# Patient Record
Sex: Female | Born: 1955 | Race: White | Hispanic: No | Marital: Married | Smoking: Former smoker
Health system: Southern US, Community
[De-identification: ages and names within clinical notes are randomized; demographics above are authoritative.]

## PROBLEM LIST (undated history)

## (undated) DIAGNOSIS — F329 Major depressive disorder, single episode, unspecified: Secondary | ICD-10-CM

## (undated) DIAGNOSIS — J449 Chronic obstructive pulmonary disease, unspecified: Secondary | ICD-10-CM

## (undated) DIAGNOSIS — K3184 Gastroparesis: Secondary | ICD-10-CM

## (undated) DIAGNOSIS — Z780 Asymptomatic menopausal state: Secondary | ICD-10-CM

## (undated) DIAGNOSIS — A4902 Methicillin resistant Staphylococcus aureus infection, unspecified site: Secondary | ICD-10-CM

## (undated) DIAGNOSIS — F32A Depression, unspecified: Secondary | ICD-10-CM

## (undated) DIAGNOSIS — M797 Fibromyalgia: Secondary | ICD-10-CM

## (undated) DIAGNOSIS — G629 Polyneuropathy, unspecified: Secondary | ICD-10-CM

## (undated) DIAGNOSIS — A0472 Enterocolitis due to Clostridium difficile, not specified as recurrent: Secondary | ICD-10-CM

## (undated) DIAGNOSIS — E669 Obesity, unspecified: Secondary | ICD-10-CM

## (undated) DIAGNOSIS — K219 Gastro-esophageal reflux disease without esophagitis: Secondary | ICD-10-CM

## (undated) DIAGNOSIS — K589 Irritable bowel syndrome without diarrhea: Secondary | ICD-10-CM

## (undated) DIAGNOSIS — Z8719 Personal history of other diseases of the digestive system: Secondary | ICD-10-CM

## (undated) DIAGNOSIS — E119 Type 2 diabetes mellitus without complications: Secondary | ICD-10-CM

## (undated) DIAGNOSIS — K2270 Barrett's esophagus without dysplasia: Secondary | ICD-10-CM

## (undated) DIAGNOSIS — G4733 Obstructive sleep apnea (adult) (pediatric): Secondary | ICD-10-CM

## (undated) DIAGNOSIS — G43909 Migraine, unspecified, not intractable, without status migrainosus: Secondary | ICD-10-CM

## (undated) HISTORY — DX: Irritable bowel syndrome, unspecified: K58.9

## (undated) HISTORY — DX: Obstructive sleep apnea (adult) (pediatric): G47.33

## (undated) HISTORY — DX: Depression, unspecified: F32.A

## (undated) HISTORY — DX: Methicillin resistant Staphylococcus aureus infection, unspecified site: A49.02

## (undated) HISTORY — DX: Enterocolitis due to Clostridium difficile, not specified as recurrent: A04.72

## (undated) HISTORY — DX: Gastroparesis: K31.84

## (undated) HISTORY — DX: Chronic obstructive pulmonary disease, unspecified: J44.9

## (undated) HISTORY — DX: Obesity, unspecified: E66.9

## (undated) HISTORY — DX: Polyneuropathy, unspecified: G62.9

## (undated) HISTORY — DX: Fibromyalgia: M79.7

## (undated) HISTORY — PX: OTHER SURGICAL HISTORY: SHX169

## (undated) HISTORY — DX: Personal history of other diseases of the digestive system: Z87.19

## (undated) HISTORY — PX: APPENDECTOMY: SHX54

## (undated) HISTORY — PX: SINUS SURGERY WITH INSTATRAK: SHX5215

## (undated) HISTORY — PX: VENTRAL HERNIA REPAIR: SHX424

## (undated) HISTORY — DX: Type 2 diabetes mellitus without complications: E11.9

## (undated) HISTORY — DX: Asymptomatic menopausal state: Z78.0

## (undated) HISTORY — DX: Migraine, unspecified, not intractable, without status migrainosus: G43.909

## (undated) HISTORY — DX: Major depressive disorder, single episode, unspecified: F32.9

## (undated) HISTORY — PX: CHOLECYSTECTOMY: SHX55

## (undated) HISTORY — PX: MOUTH SURGERY: SHX715

## (undated) HISTORY — PX: ABDOMINAL HYSTERECTOMY: SHX81

## (undated) HISTORY — DX: Gastro-esophageal reflux disease without esophagitis: K21.9

---

## 1997-09-25 HISTORY — PX: NISSEN FUNDOPLICATION: SHX2091

## 1998-03-12 ENCOUNTER — Ambulatory Visit (HOSPITAL_COMMUNITY): Admission: RE | Admit: 1998-03-12 | Discharge: 1998-03-12 | Payer: Self-pay | Admitting: Internal Medicine

## 1998-08-09 ENCOUNTER — Ambulatory Visit (HOSPITAL_COMMUNITY): Admission: RE | Admit: 1998-08-09 | Discharge: 1998-08-09 | Payer: Self-pay | Admitting: Gastroenterology

## 1998-09-03 ENCOUNTER — Encounter: Payer: Self-pay | Admitting: Surgery

## 1998-09-07 ENCOUNTER — Inpatient Hospital Stay (HOSPITAL_COMMUNITY): Admission: EM | Admit: 1998-09-07 | Discharge: 1998-09-09 | Payer: Self-pay | Admitting: Surgery

## 1998-09-09 ENCOUNTER — Encounter: Payer: Self-pay | Admitting: Surgery

## 1998-12-29 ENCOUNTER — Ambulatory Visit (HOSPITAL_COMMUNITY): Admission: RE | Admit: 1998-12-29 | Discharge: 1998-12-29 | Payer: Self-pay | Admitting: Surgery

## 1998-12-29 ENCOUNTER — Encounter: Payer: Self-pay | Admitting: Surgery

## 1999-07-17 ENCOUNTER — Emergency Department (HOSPITAL_COMMUNITY): Admission: EM | Admit: 1999-07-17 | Discharge: 1999-07-17 | Payer: Self-pay | Admitting: Emergency Medicine

## 1999-07-17 ENCOUNTER — Encounter: Payer: Self-pay | Admitting: Emergency Medicine

## 1999-09-08 ENCOUNTER — Other Ambulatory Visit: Admission: RE | Admit: 1999-09-08 | Discharge: 1999-09-08 | Payer: Self-pay | Admitting: Obstetrics and Gynecology

## 1999-11-08 ENCOUNTER — Encounter: Payer: Self-pay | Admitting: Emergency Medicine

## 1999-11-08 ENCOUNTER — Emergency Department (HOSPITAL_COMMUNITY): Admission: EM | Admit: 1999-11-08 | Discharge: 1999-11-08 | Payer: Self-pay | Admitting: Emergency Medicine

## 1999-11-11 ENCOUNTER — Ambulatory Visit (HOSPITAL_COMMUNITY): Admission: RE | Admit: 1999-11-11 | Discharge: 1999-11-11 | Payer: Self-pay | Admitting: Internal Medicine

## 1999-11-11 ENCOUNTER — Encounter: Payer: Self-pay | Admitting: Internal Medicine

## 2000-02-07 ENCOUNTER — Encounter: Payer: Self-pay | Admitting: Obstetrics & Gynecology

## 2000-02-07 ENCOUNTER — Ambulatory Visit (HOSPITAL_COMMUNITY): Admission: RE | Admit: 2000-02-07 | Discharge: 2000-02-07 | Payer: Self-pay | Admitting: Obstetrics & Gynecology

## 2000-07-24 ENCOUNTER — Ambulatory Visit (HOSPITAL_COMMUNITY): Admission: RE | Admit: 2000-07-24 | Discharge: 2000-07-24 | Payer: Self-pay | Admitting: Gastroenterology

## 2000-07-24 ENCOUNTER — Encounter: Payer: Self-pay | Admitting: Gastroenterology

## 2000-10-22 ENCOUNTER — Other Ambulatory Visit: Admission: RE | Admit: 2000-10-22 | Discharge: 2000-10-22 | Payer: Self-pay | Admitting: Internal Medicine

## 2000-12-26 ENCOUNTER — Ambulatory Visit (HOSPITAL_COMMUNITY): Admission: RE | Admit: 2000-12-26 | Discharge: 2000-12-26 | Payer: Self-pay | Admitting: Internal Medicine

## 2000-12-26 ENCOUNTER — Encounter: Payer: Self-pay | Admitting: Internal Medicine

## 2001-01-09 ENCOUNTER — Encounter: Payer: Self-pay | Admitting: Pulmonary Disease

## 2001-01-09 ENCOUNTER — Ambulatory Visit: Admission: RE | Admit: 2001-01-09 | Discharge: 2001-01-09 | Payer: Self-pay | Admitting: Pulmonary Disease

## 2001-01-28 ENCOUNTER — Encounter: Payer: Self-pay | Admitting: Internal Medicine

## 2001-01-28 ENCOUNTER — Ambulatory Visit (HOSPITAL_BASED_OUTPATIENT_CLINIC_OR_DEPARTMENT_OTHER): Admission: RE | Admit: 2001-01-28 | Discharge: 2001-01-28 | Payer: Self-pay | Admitting: Pulmonary Disease

## 2001-03-13 ENCOUNTER — Encounter: Payer: Self-pay | Admitting: Internal Medicine

## 2001-03-13 ENCOUNTER — Ambulatory Visit (HOSPITAL_COMMUNITY): Admission: RE | Admit: 2001-03-13 | Discharge: 2001-03-13 | Payer: Self-pay | Admitting: Internal Medicine

## 2001-06-19 ENCOUNTER — Ambulatory Visit (HOSPITAL_COMMUNITY): Admission: RE | Admit: 2001-06-19 | Discharge: 2001-06-19 | Payer: Self-pay | Admitting: Internal Medicine

## 2001-06-19 ENCOUNTER — Encounter: Payer: Self-pay | Admitting: Internal Medicine

## 2001-06-19 ENCOUNTER — Encounter (INDEPENDENT_AMBULATORY_CARE_PROVIDER_SITE_OTHER): Payer: Self-pay | Admitting: Specialist

## 2002-04-08 ENCOUNTER — Encounter: Payer: Self-pay | Admitting: Internal Medicine

## 2002-04-08 ENCOUNTER — Ambulatory Visit (HOSPITAL_COMMUNITY): Admission: RE | Admit: 2002-04-08 | Discharge: 2002-04-08 | Payer: Self-pay | Admitting: Internal Medicine

## 2002-05-06 ENCOUNTER — Encounter: Payer: Self-pay | Admitting: Internal Medicine

## 2002-05-06 LAB — HM COLONOSCOPY

## 2003-01-23 ENCOUNTER — Encounter: Payer: Self-pay | Admitting: Emergency Medicine

## 2003-01-23 ENCOUNTER — Inpatient Hospital Stay (HOSPITAL_COMMUNITY): Admission: EM | Admit: 2003-01-23 | Discharge: 2003-01-25 | Payer: Self-pay | Admitting: Emergency Medicine

## 2003-01-23 ENCOUNTER — Encounter: Payer: Self-pay | Admitting: Internal Medicine

## 2003-01-23 ENCOUNTER — Encounter (INDEPENDENT_AMBULATORY_CARE_PROVIDER_SITE_OTHER): Payer: Self-pay | Admitting: *Deleted

## 2003-01-24 ENCOUNTER — Encounter (INDEPENDENT_AMBULATORY_CARE_PROVIDER_SITE_OTHER): Payer: Self-pay | Admitting: *Deleted

## 2003-01-24 ENCOUNTER — Encounter: Payer: Self-pay | Admitting: General Surgery

## 2003-03-16 ENCOUNTER — Ambulatory Visit (HOSPITAL_COMMUNITY): Admission: RE | Admit: 2003-03-16 | Discharge: 2003-03-16 | Payer: Self-pay | Admitting: General Surgery

## 2003-03-16 ENCOUNTER — Encounter (INDEPENDENT_AMBULATORY_CARE_PROVIDER_SITE_OTHER): Payer: Self-pay | Admitting: *Deleted

## 2003-03-16 ENCOUNTER — Encounter: Payer: Self-pay | Admitting: General Surgery

## 2003-05-05 ENCOUNTER — Ambulatory Visit (HOSPITAL_COMMUNITY): Admission: RE | Admit: 2003-05-05 | Discharge: 2003-05-05 | Payer: Self-pay | Admitting: Internal Medicine

## 2003-05-05 ENCOUNTER — Encounter: Payer: Self-pay | Admitting: Internal Medicine

## 2003-09-01 ENCOUNTER — Emergency Department (HOSPITAL_COMMUNITY): Admission: EM | Admit: 2003-09-01 | Discharge: 2003-09-01 | Payer: Self-pay | Admitting: Emergency Medicine

## 2003-10-30 ENCOUNTER — Emergency Department (HOSPITAL_COMMUNITY): Admission: EM | Admit: 2003-10-30 | Discharge: 2003-10-31 | Payer: Self-pay | Admitting: Emergency Medicine

## 2003-11-24 ENCOUNTER — Inpatient Hospital Stay (HOSPITAL_COMMUNITY): Admission: EM | Admit: 2003-11-24 | Discharge: 2003-11-28 | Payer: Self-pay | Admitting: Internal Medicine

## 2004-01-23 ENCOUNTER — Encounter: Payer: Self-pay | Admitting: Internal Medicine

## 2004-04-19 ENCOUNTER — Encounter: Admission: RE | Admit: 2004-04-19 | Discharge: 2004-07-18 | Payer: Self-pay | Admitting: Internal Medicine

## 2004-06-08 ENCOUNTER — Ambulatory Visit (HOSPITAL_COMMUNITY): Admission: RE | Admit: 2004-06-08 | Discharge: 2004-06-08 | Payer: Self-pay | Admitting: Internal Medicine

## 2004-08-29 ENCOUNTER — Ambulatory Visit: Payer: Self-pay | Admitting: Internal Medicine

## 2004-09-28 ENCOUNTER — Ambulatory Visit: Payer: Self-pay | Admitting: Internal Medicine

## 2004-10-31 ENCOUNTER — Encounter: Admission: RE | Admit: 2004-10-31 | Discharge: 2004-10-31 | Payer: Self-pay | Admitting: Surgery

## 2004-10-31 ENCOUNTER — Encounter (INDEPENDENT_AMBULATORY_CARE_PROVIDER_SITE_OTHER): Payer: Self-pay | Admitting: *Deleted

## 2004-11-15 ENCOUNTER — Observation Stay (HOSPITAL_COMMUNITY): Admission: RE | Admit: 2004-11-15 | Discharge: 2004-11-16 | Payer: Self-pay | Admitting: Surgery

## 2005-03-02 ENCOUNTER — Ambulatory Visit: Payer: Self-pay | Admitting: Internal Medicine

## 2005-04-10 ENCOUNTER — Ambulatory Visit: Payer: Self-pay | Admitting: Internal Medicine

## 2005-04-27 ENCOUNTER — Ambulatory Visit: Payer: Self-pay | Admitting: Internal Medicine

## 2005-08-07 ENCOUNTER — Ambulatory Visit: Payer: Self-pay | Admitting: Internal Medicine

## 2005-08-23 ENCOUNTER — Other Ambulatory Visit: Admission: RE | Admit: 2005-08-23 | Discharge: 2005-08-23 | Payer: Self-pay | Admitting: Obstetrics and Gynecology

## 2005-09-06 ENCOUNTER — Ambulatory Visit (HOSPITAL_COMMUNITY): Admission: RE | Admit: 2005-09-06 | Discharge: 2005-09-06 | Payer: Self-pay | Admitting: Obstetrics and Gynecology

## 2005-09-28 ENCOUNTER — Encounter (INDEPENDENT_AMBULATORY_CARE_PROVIDER_SITE_OTHER): Payer: Self-pay | Admitting: *Deleted

## 2005-09-28 ENCOUNTER — Encounter: Admission: RE | Admit: 2005-09-28 | Discharge: 2005-09-28 | Payer: Self-pay | Admitting: Obstetrics and Gynecology

## 2005-11-02 ENCOUNTER — Ambulatory Visit: Payer: Self-pay | Admitting: Internal Medicine

## 2005-12-07 ENCOUNTER — Ambulatory Visit: Payer: Self-pay | Admitting: Internal Medicine

## 2005-12-25 ENCOUNTER — Ambulatory Visit: Payer: Self-pay | Admitting: Internal Medicine

## 2005-12-27 ENCOUNTER — Ambulatory Visit: Payer: Self-pay | Admitting: Internal Medicine

## 2006-01-24 ENCOUNTER — Ambulatory Visit: Payer: Self-pay | Admitting: Internal Medicine

## 2006-04-26 ENCOUNTER — Encounter: Admission: RE | Admit: 2006-04-26 | Discharge: 2006-04-26 | Payer: Self-pay | Admitting: Internal Medicine

## 2006-05-16 ENCOUNTER — Ambulatory Visit: Payer: Self-pay | Admitting: Internal Medicine

## 2006-07-14 ENCOUNTER — Ambulatory Visit: Payer: Self-pay | Admitting: Family Medicine

## 2006-07-20 ENCOUNTER — Ambulatory Visit: Payer: Self-pay | Admitting: Internal Medicine

## 2006-07-25 ENCOUNTER — Ambulatory Visit: Payer: Self-pay | Admitting: Internal Medicine

## 2006-07-25 LAB — CONVERTED CEMR LAB
ALT: 25 units/L (ref 0–40)
AST: 20 units/L (ref 0–37)
Albumin: 3.8 g/dL (ref 3.5–5.2)
Alkaline Phosphatase: 87 units/L (ref 39–117)
BUN: 11 mg/dL (ref 6–23)
GFR calc non Af Amer: 71 mL/min
Glomerular Filtration Rate, Af Am: 86 mL/min/{1.73_m2}
HDL: 42.1 mg/dL (ref >39.0)
Potassium: 4.1 meq/L (ref 3.5–5.1)
Total Bilirubin: 0.7 mg/dL (ref 0.3–1.2)
Total Protein: 7 g/dL (ref 6.0–8.3)

## 2006-12-11 ENCOUNTER — Ambulatory Visit: Payer: Self-pay | Admitting: Internal Medicine

## 2006-12-26 ENCOUNTER — Ambulatory Visit: Payer: Self-pay | Admitting: Internal Medicine

## 2006-12-26 LAB — CONVERTED CEMR LAB
ALT: 23 units/L (ref 0–40)
Basophils Relative: 0.3 % (ref 0.0–1.0)
Bilirubin, Direct: 0.1 mg/dL (ref 0.0–0.3)
CO2: 29 meq/L (ref 19–32)
Calcium: 9.1 mg/dL (ref 8.4–10.5)
Creatinine, Ser: 0.8 mg/dL (ref 0.4–1.2)
Eosinophils Relative: 2.2 % (ref 0.0–5.0)
GFR calc Af Amer: 98 mL/min
Glucose, Bld: 141 mg/dL — ABNORMAL HIGH (ref 70–99)
Hemoglobin: 15.6 g/dL — ABNORMAL HIGH (ref 12.0–15.0)
Lymphocytes Relative: 34.5 % (ref 12.0–46.0)
Monocytes Absolute: 0.3 10*3/uL (ref 0.2–0.7)
Neutro Abs: 5.7 10*3/uL (ref 1.4–7.7)
Potassium: 4.2 meq/L (ref 3.5–5.1)
RDW: 12 % (ref 11.5–14.6)
TSH: 2.91 microintl units/mL (ref 0.35–5.50)
Total Bilirubin: 0.5 mg/dL (ref 0.3–1.2)
Total Protein: 6.5 g/dL (ref 6.0–8.3)
VLDL: 64 mg/dL — ABNORMAL HIGH (ref 0–40)
WBC: 9.5 10*3/uL (ref 4.5–10.5)

## 2007-01-04 ENCOUNTER — Ambulatory Visit: Payer: Self-pay | Admitting: Internal Medicine

## 2007-01-18 ENCOUNTER — Ambulatory Visit: Payer: Self-pay | Admitting: Internal Medicine

## 2007-01-24 ENCOUNTER — Ambulatory Visit (HOSPITAL_COMMUNITY): Admission: RE | Admit: 2007-01-24 | Discharge: 2007-01-24 | Payer: Self-pay | Admitting: Internal Medicine

## 2007-02-05 ENCOUNTER — Ambulatory Visit: Payer: Self-pay | Admitting: Internal Medicine

## 2007-03-08 ENCOUNTER — Ambulatory Visit: Payer: Self-pay | Admitting: Internal Medicine

## 2007-03-26 ENCOUNTER — Ambulatory Visit: Payer: Self-pay | Admitting: Internal Medicine

## 2007-03-26 LAB — CONVERTED CEMR LAB
Hemoglobin, Urine: NEGATIVE
Leukocytes, UA: NEGATIVE
Urobilinogen, UA: 0.2 (ref 0.0–1.0)

## 2007-03-27 ENCOUNTER — Ambulatory Visit: Payer: Self-pay | Admitting: Internal Medicine

## 2007-05-09 ENCOUNTER — Ambulatory Visit: Payer: Self-pay | Admitting: Internal Medicine

## 2007-05-09 LAB — CONVERTED CEMR LAB
AST: 22 units/L (ref 0–37)
BUN: 7 mg/dL (ref 6–23)
Cholesterol: 199 mg/dL (ref 0–200)
Direct LDL: 103.9 mg/dL
HDL: 37.9 mg/dL — ABNORMAL LOW (ref >39.0)
Sed Rate: 10 mm/hr (ref 0–25)
Sodium: 138 meq/L (ref 135–145)
TSH: 1.71 microintl units/mL (ref 0.35–5.50)
Total CHOL/HDL Ratio: 5.3
Triglycerides: 411 mg/dL (ref 0–149)
VLDL: 82 mg/dL — ABNORMAL HIGH (ref 0–40)
Vitamin B-12: 374 pg/mL (ref 211–911)

## 2007-05-13 ENCOUNTER — Ambulatory Visit: Payer: Self-pay | Admitting: Internal Medicine

## 2007-05-22 ENCOUNTER — Ambulatory Visit: Payer: Self-pay

## 2007-05-29 ENCOUNTER — Ambulatory Visit: Payer: Self-pay

## 2007-06-14 ENCOUNTER — Ambulatory Visit: Payer: Self-pay | Admitting: Psychology

## 2007-06-19 ENCOUNTER — Ambulatory Visit: Payer: Self-pay | Admitting: Psychology

## 2007-06-26 ENCOUNTER — Ambulatory Visit: Payer: Self-pay | Admitting: Internal Medicine

## 2007-07-09 ENCOUNTER — Encounter: Payer: Self-pay | Admitting: Internal Medicine

## 2007-07-09 DIAGNOSIS — F329 Major depressive disorder, single episode, unspecified: Secondary | ICD-10-CM

## 2007-07-09 DIAGNOSIS — K219 Gastro-esophageal reflux disease without esophagitis: Secondary | ICD-10-CM

## 2007-07-09 DIAGNOSIS — J45909 Unspecified asthma, uncomplicated: Secondary | ICD-10-CM

## 2007-07-09 DIAGNOSIS — J449 Chronic obstructive pulmonary disease, unspecified: Secondary | ICD-10-CM

## 2007-07-09 DIAGNOSIS — J4489 Other specified chronic obstructive pulmonary disease: Secondary | ICD-10-CM | POA: Insufficient documentation

## 2007-07-11 ENCOUNTER — Ambulatory Visit: Payer: Self-pay | Admitting: Psychology

## 2007-07-20 ENCOUNTER — Ambulatory Visit: Payer: Self-pay | Admitting: Internal Medicine

## 2007-07-22 ENCOUNTER — Ambulatory Visit: Payer: Self-pay | Admitting: Psychology

## 2007-07-25 ENCOUNTER — Telehealth: Payer: Self-pay | Admitting: Internal Medicine

## 2007-08-19 ENCOUNTER — Ambulatory Visit: Payer: Self-pay | Admitting: Psychology

## 2007-08-27 ENCOUNTER — Ambulatory Visit: Payer: Self-pay | Admitting: Internal Medicine

## 2007-08-27 DIAGNOSIS — R5381 Other malaise: Secondary | ICD-10-CM

## 2007-08-27 DIAGNOSIS — E7800 Pure hypercholesterolemia, unspecified: Secondary | ICD-10-CM

## 2007-08-27 DIAGNOSIS — R5383 Other fatigue: Secondary | ICD-10-CM | POA: Insufficient documentation

## 2007-08-27 DIAGNOSIS — IMO0001 Reserved for inherently not codable concepts without codable children: Secondary | ICD-10-CM | POA: Insufficient documentation

## 2007-08-27 LAB — CONVERTED CEMR LAB: Blood Glucose, Fingerstick: 90

## 2007-08-29 LAB — CONVERTED CEMR LAB
ALT: 18 units/L (ref 0–35)
AST: 22 units/L (ref 0–37)
Basophils Relative: 0.7 % (ref 0.0–1.0)
Bilirubin, Direct: 0.1 mg/dL (ref 0.0–0.3)
CO2: 27 meq/L (ref 19–32)
Calcium: 9.6 mg/dL (ref 8.4–10.5)
Chloride: 101 meq/L (ref 96–112)
Creatinine, Ser: 0.8 mg/dL (ref 0.4–1.2)
Eosinophils Relative: 1.8 % (ref 0.0–5.0)
Glucose, Bld: 139 mg/dL — ABNORMAL HIGH (ref 70–99)
Leukocytes, UA: NEGATIVE
Lymphocytes Relative: 32 % (ref 12.0–46.0)
Neutro Abs: 6.6 10*3/uL (ref 1.4–7.7)
Nitrite: NEGATIVE
Platelets: 308 10*3/uL (ref 150–400)
Specific Gravity, Urine: >=1.03 (ref 1.000–1.03)
Total Bilirubin: 0.4 mg/dL (ref 0.3–1.2)
Total Protein, Urine: NEGATIVE mg/dL
Total Protein: 6.6 g/dL (ref 6.0–8.3)
Triglycerides: 337 mg/dL (ref 0–149)
Urine Glucose: NEGATIVE mg/dL
WBC: 10.6 10*3/uL — ABNORMAL HIGH (ref 4.5–10.5)

## 2007-09-02 ENCOUNTER — Ambulatory Visit: Payer: Self-pay | Admitting: Psychology

## 2007-09-16 ENCOUNTER — Ambulatory Visit: Payer: Self-pay | Admitting: Psychology

## 2007-09-30 ENCOUNTER — Ambulatory Visit: Payer: Self-pay | Admitting: Psychology

## 2007-10-14 ENCOUNTER — Ambulatory Visit: Payer: Self-pay | Admitting: Psychology

## 2007-11-11 ENCOUNTER — Telehealth: Payer: Self-pay | Admitting: Internal Medicine

## 2007-11-27 ENCOUNTER — Ambulatory Visit: Payer: Self-pay | Admitting: Internal Medicine

## 2007-11-27 DIAGNOSIS — G609 Hereditary and idiopathic neuropathy, unspecified: Secondary | ICD-10-CM | POA: Insufficient documentation

## 2007-11-27 DIAGNOSIS — R209 Unspecified disturbances of skin sensation: Secondary | ICD-10-CM

## 2007-11-27 DIAGNOSIS — E1165 Type 2 diabetes mellitus with hyperglycemia: Secondary | ICD-10-CM

## 2007-12-09 ENCOUNTER — Ambulatory Visit: Payer: Self-pay | Admitting: Psychology

## 2007-12-31 ENCOUNTER — Ambulatory Visit: Payer: Self-pay | Admitting: Psychology

## 2008-01-02 ENCOUNTER — Ambulatory Visit: Payer: Self-pay | Admitting: Internal Medicine

## 2008-01-12 ENCOUNTER — Emergency Department (HOSPITAL_COMMUNITY): Admission: EM | Admit: 2008-01-12 | Discharge: 2008-01-12 | Payer: Self-pay | Admitting: Emergency Medicine

## 2008-01-15 ENCOUNTER — Ambulatory Visit: Payer: Self-pay | Admitting: Internal Medicine

## 2008-01-15 ENCOUNTER — Ambulatory Visit: Payer: Self-pay | Admitting: Psychology

## 2008-01-15 DIAGNOSIS — R079 Chest pain, unspecified: Secondary | ICD-10-CM

## 2008-01-15 DIAGNOSIS — F4321 Adjustment disorder with depressed mood: Secondary | ICD-10-CM | POA: Insufficient documentation

## 2008-01-15 DIAGNOSIS — F17200 Nicotine dependence, unspecified, uncomplicated: Secondary | ICD-10-CM

## 2008-01-23 ENCOUNTER — Telehealth: Payer: Self-pay | Admitting: Internal Medicine

## 2008-02-11 ENCOUNTER — Ambulatory Visit (HOSPITAL_COMMUNITY): Admission: RE | Admit: 2008-02-11 | Discharge: 2008-02-11 | Payer: Self-pay | Admitting: Internal Medicine

## 2008-02-18 ENCOUNTER — Telehealth: Payer: Self-pay | Admitting: Internal Medicine

## 2008-02-20 ENCOUNTER — Ambulatory Visit: Payer: Self-pay | Admitting: Internal Medicine

## 2008-02-21 ENCOUNTER — Encounter: Payer: Self-pay | Admitting: Internal Medicine

## 2008-02-23 LAB — CONVERTED CEMR LAB
Albumin: 3.6 g/dL (ref 3.5–5.2)
Alkaline Phosphatase: 58 units/L (ref 39–117)
BUN: 12 mg/dL (ref 6–23)
Bilirubin, Direct: 0.1 mg/dL (ref 0.0–0.3)
Calcium: 9.1 mg/dL (ref 8.4–10.5)
Eosinophils Absolute: 0.3 10*3/uL (ref 0.0–0.7)
GFR calc Af Amer: 97 mL/min
GFR calc non Af Amer: 80 mL/min
HCT: 44.5 % (ref 36.0–46.0)
MCV: 95.9 fL (ref 78.0–100.0)
Monocytes Absolute: 0.5 10*3/uL (ref 0.1–1.0)
Neutro Abs: 4.6 10*3/uL (ref 1.4–7.7)
Platelets: 266 10*3/uL (ref 150–400)
Potassium: 4.3 meq/L (ref 3.5–5.1)
RDW: 12.2 % (ref 11.5–14.6)
Sodium: 140 meq/L (ref 135–145)
TSH: 2.17 microintl units/mL (ref 0.35–5.50)
Vitamin B-12: 372 pg/mL (ref 211–911)

## 2008-02-26 ENCOUNTER — Ambulatory Visit: Payer: Self-pay | Admitting: Internal Medicine

## 2008-02-26 ENCOUNTER — Encounter (INDEPENDENT_AMBULATORY_CARE_PROVIDER_SITE_OTHER): Payer: Self-pay | Admitting: *Deleted

## 2008-02-26 DIAGNOSIS — R1310 Dysphagia, unspecified: Secondary | ICD-10-CM | POA: Insufficient documentation

## 2008-03-11 ENCOUNTER — Ambulatory Visit: Payer: Self-pay | Admitting: Internal Medicine

## 2008-03-11 DIAGNOSIS — J40 Bronchitis, not specified as acute or chronic: Secondary | ICD-10-CM | POA: Insufficient documentation

## 2008-03-11 DIAGNOSIS — R05 Cough: Secondary | ICD-10-CM

## 2008-03-11 DIAGNOSIS — R059 Cough, unspecified: Secondary | ICD-10-CM | POA: Insufficient documentation

## 2008-03-13 ENCOUNTER — Telehealth: Payer: Self-pay | Admitting: Internal Medicine

## 2008-03-18 ENCOUNTER — Encounter: Payer: Self-pay | Admitting: Internal Medicine

## 2008-03-20 ENCOUNTER — Emergency Department (HOSPITAL_COMMUNITY): Admission: EM | Admit: 2008-03-20 | Discharge: 2008-03-20 | Payer: Self-pay | Admitting: Emergency Medicine

## 2008-04-06 ENCOUNTER — Encounter: Payer: Self-pay | Admitting: Internal Medicine

## 2008-04-13 DIAGNOSIS — K589 Irritable bowel syndrome without diarrhea: Secondary | ICD-10-CM

## 2008-04-13 DIAGNOSIS — Z8719 Personal history of other diseases of the digestive system: Secondary | ICD-10-CM

## 2008-04-16 ENCOUNTER — Ambulatory Visit: Payer: Self-pay | Admitting: Internal Medicine

## 2008-04-22 ENCOUNTER — Encounter: Payer: Self-pay | Admitting: Internal Medicine

## 2008-04-22 ENCOUNTER — Ambulatory Visit (HOSPITAL_COMMUNITY): Admission: RE | Admit: 2008-04-22 | Discharge: 2008-04-22 | Payer: Self-pay | Admitting: Internal Medicine

## 2008-04-23 ENCOUNTER — Encounter: Payer: Self-pay | Admitting: Internal Medicine

## 2008-04-23 ENCOUNTER — Telehealth: Payer: Self-pay | Admitting: Internal Medicine

## 2008-04-23 DIAGNOSIS — R1032 Left lower quadrant pain: Secondary | ICD-10-CM | POA: Insufficient documentation

## 2008-04-23 DIAGNOSIS — R1012 Left upper quadrant pain: Secondary | ICD-10-CM

## 2008-04-24 ENCOUNTER — Encounter: Payer: Self-pay | Admitting: Internal Medicine

## 2008-04-27 ENCOUNTER — Telehealth: Payer: Self-pay | Admitting: Internal Medicine

## 2008-04-27 ENCOUNTER — Ambulatory Visit: Payer: Self-pay | Admitting: Psychology

## 2008-04-28 ENCOUNTER — Ambulatory Visit: Payer: Self-pay | Admitting: Cardiology

## 2008-04-28 ENCOUNTER — Ambulatory Visit: Payer: Self-pay | Admitting: Internal Medicine

## 2008-05-08 ENCOUNTER — Ambulatory Visit: Payer: Self-pay | Admitting: Internal Medicine

## 2008-05-08 DIAGNOSIS — R61 Generalized hyperhidrosis: Secondary | ICD-10-CM | POA: Insufficient documentation

## 2008-05-14 ENCOUNTER — Ambulatory Visit: Payer: Self-pay | Admitting: Internal Medicine

## 2008-05-20 ENCOUNTER — Telehealth: Payer: Self-pay | Admitting: Internal Medicine

## 2008-05-20 ENCOUNTER — Encounter: Payer: Self-pay | Admitting: Internal Medicine

## 2008-05-29 ENCOUNTER — Telehealth: Payer: Self-pay | Admitting: Internal Medicine

## 2008-06-15 ENCOUNTER — Ambulatory Visit: Payer: Self-pay | Admitting: Internal Medicine

## 2008-06-15 DIAGNOSIS — N12 Tubulo-interstitial nephritis, not specified as acute or chronic: Secondary | ICD-10-CM | POA: Insufficient documentation

## 2008-06-16 LAB — CONVERTED CEMR LAB
Nitrite: POSITIVE — AB
Specific Gravity, Urine: >=1.03 (ref 1.000–1.03)
Total Protein, Urine: NEGATIVE mg/dL
pH: 5.5 (ref 5.0–8.0)

## 2008-07-07 ENCOUNTER — Encounter: Payer: Self-pay | Admitting: Internal Medicine

## 2008-07-21 ENCOUNTER — Telehealth: Payer: Self-pay | Admitting: Internal Medicine

## 2008-07-21 ENCOUNTER — Encounter: Payer: Self-pay | Admitting: Internal Medicine

## 2008-07-22 ENCOUNTER — Ambulatory Visit: Payer: Self-pay | Admitting: Internal Medicine

## 2008-07-22 LAB — CONVERTED CEMR LAB
Basophils Absolute: 0.1 10*3/uL (ref 0.0–0.1)
Basophils Relative: 0.7 % (ref 0.0–3.0)
Bilirubin, Direct: 0.1 mg/dL (ref 0.0–0.3)
CO2: 23 meq/L (ref 19–32)
Calcium: 9.2 mg/dL (ref 8.4–10.5)
Cholesterol: 215 mg/dL (ref 0–200)
Creatinine, Ser: 0.8 mg/dL (ref 0.4–1.2)
Direct LDL: 95.6 mg/dL
Eosinophils Absolute: 0.2 10*3/uL (ref 0.0–0.7)
GFR calc non Af Amer: 80 mL/min
HDL: 40 mg/dL (ref >39.0)
Hgb A1c MFr Bld: 7.1 % — ABNORMAL HIGH (ref 4.6–6.0)
Lymphocytes Relative: 39.2 % (ref 12.0–46.0)
MCHC: 35.1 g/dL (ref 30.0–36.0)
MCV: 93.3 fL (ref 78.0–100.0)
Neutro Abs: 6.7 10*3/uL (ref 1.4–7.7)
Neutrophils Relative %: 54.5 % (ref 43.0–77.0)
RBC: 4.98 M/uL (ref 3.87–5.11)
RDW: 11.4 % — ABNORMAL LOW (ref 11.5–14.6)
Sodium: 140 meq/L (ref 135–145)
TSH: 2.88 microintl units/mL (ref 0.35–5.50)
Total Bilirubin: 0.6 mg/dL (ref 0.3–1.2)
VLDL: 126 mg/dL — ABNORMAL HIGH (ref 0–40)

## 2008-07-23 ENCOUNTER — Ambulatory Visit: Payer: Self-pay | Admitting: Internal Medicine

## 2008-07-23 DIAGNOSIS — R609 Edema, unspecified: Secondary | ICD-10-CM | POA: Insufficient documentation

## 2008-07-27 ENCOUNTER — Encounter: Payer: Self-pay | Admitting: Internal Medicine

## 2008-09-01 ENCOUNTER — Telehealth: Payer: Self-pay | Admitting: Internal Medicine

## 2008-09-01 ENCOUNTER — Ambulatory Visit: Payer: Self-pay | Admitting: Internal Medicine

## 2008-09-07 ENCOUNTER — Telehealth: Payer: Self-pay | Admitting: Internal Medicine

## 2008-09-10 ENCOUNTER — Ambulatory Visit: Payer: Self-pay | Admitting: Internal Medicine

## 2008-09-10 DIAGNOSIS — J019 Acute sinusitis, unspecified: Secondary | ICD-10-CM | POA: Insufficient documentation

## 2008-11-10 ENCOUNTER — Ambulatory Visit: Payer: Self-pay | Admitting: Internal Medicine

## 2008-12-02 ENCOUNTER — Telehealth: Payer: Self-pay | Admitting: Internal Medicine

## 2008-12-07 ENCOUNTER — Encounter: Payer: Self-pay | Admitting: Internal Medicine

## 2008-12-08 ENCOUNTER — Encounter: Payer: Self-pay | Admitting: Internal Medicine

## 2009-01-05 ENCOUNTER — Telehealth: Payer: Self-pay | Admitting: Internal Medicine

## 2009-01-06 ENCOUNTER — Encounter (INDEPENDENT_AMBULATORY_CARE_PROVIDER_SITE_OTHER): Payer: Self-pay | Admitting: *Deleted

## 2009-01-06 ENCOUNTER — Ambulatory Visit: Payer: Self-pay | Admitting: Internal Medicine

## 2009-01-06 ENCOUNTER — Ambulatory Visit: Payer: Self-pay | Admitting: Gastroenterology

## 2009-01-06 DIAGNOSIS — R197 Diarrhea, unspecified: Secondary | ICD-10-CM | POA: Insufficient documentation

## 2009-01-06 DIAGNOSIS — R110 Nausea: Secondary | ICD-10-CM

## 2009-01-06 DIAGNOSIS — R6881 Early satiety: Secondary | ICD-10-CM | POA: Insufficient documentation

## 2009-01-07 ENCOUNTER — Telehealth: Payer: Self-pay | Admitting: Physician Assistant

## 2009-01-08 LAB — CONVERTED CEMR LAB
ALT: 23 units/L (ref 0–35)
AST: 23 units/L (ref 0–37)
Alkaline Phosphatase: 64 units/L (ref 39–117)
Bilirubin, Direct: 0.1 mg/dL (ref 0.0–0.3)
Chloride: 105 meq/L (ref 96–112)
Cholesterol: 210 mg/dL — ABNORMAL HIGH (ref 0–200)
Direct LDL: 124.1 mg/dL
GFR calc non Af Amer: 79.94 mL/min (ref >60)
Hgb A1c MFr Bld: 7 % — ABNORMAL HIGH (ref 4.6–6.5)
Potassium: 4.2 meq/L (ref 3.5–5.1)
Sodium: 140 meq/L (ref 135–145)
TSH: 2.14 microintl units/mL (ref 0.35–5.50)
Total Bilirubin: 0.5 mg/dL (ref 0.3–1.2)
Total CHOL/HDL Ratio: 6
VLDL: 72.6 mg/dL — ABNORMAL HIGH (ref 0.0–40.0)

## 2009-01-19 ENCOUNTER — Ambulatory Visit: Payer: Self-pay | Admitting: Internal Medicine

## 2009-01-19 ENCOUNTER — Telehealth (INDEPENDENT_AMBULATORY_CARE_PROVIDER_SITE_OTHER): Payer: Self-pay | Admitting: *Deleted

## 2009-01-19 ENCOUNTER — Encounter: Payer: Self-pay | Admitting: Gastroenterology

## 2009-01-19 DIAGNOSIS — K3184 Gastroparesis: Secondary | ICD-10-CM

## 2009-01-19 DIAGNOSIS — N644 Mastodynia: Secondary | ICD-10-CM

## 2009-01-21 ENCOUNTER — Telehealth: Payer: Self-pay | Admitting: Physician Assistant

## 2009-01-22 ENCOUNTER — Encounter: Admission: RE | Admit: 2009-01-22 | Discharge: 2009-01-22 | Payer: Self-pay | Admitting: Internal Medicine

## 2009-02-02 ENCOUNTER — Ambulatory Visit: Payer: Self-pay | Admitting: Internal Medicine

## 2009-02-03 ENCOUNTER — Telehealth: Payer: Self-pay | Admitting: Internal Medicine

## 2009-02-10 ENCOUNTER — Telehealth: Payer: Self-pay | Admitting: Internal Medicine

## 2009-02-11 ENCOUNTER — Telehealth: Payer: Self-pay | Admitting: Family Medicine

## 2009-02-18 ENCOUNTER — Encounter: Payer: Self-pay | Admitting: Internal Medicine

## 2009-02-24 ENCOUNTER — Telehealth: Payer: Self-pay | Admitting: Internal Medicine

## 2009-03-15 ENCOUNTER — Encounter: Payer: Self-pay | Admitting: Internal Medicine

## 2009-04-05 ENCOUNTER — Telehealth: Payer: Self-pay | Admitting: Internal Medicine

## 2009-04-09 ENCOUNTER — Ambulatory Visit: Payer: Self-pay | Admitting: Internal Medicine

## 2009-04-09 DIAGNOSIS — L500 Allergic urticaria: Secondary | ICD-10-CM

## 2009-04-26 ENCOUNTER — Ambulatory Visit: Payer: Self-pay | Admitting: Internal Medicine

## 2009-04-26 LAB — CONVERTED CEMR LAB
CO2: 30 meq/L (ref 19–32)
Calcium: 8.9 mg/dL (ref 8.4–10.5)
Creatinine, Ser: 0.9 mg/dL (ref 0.4–1.2)
GFR calc non Af Amer: 69.7 mL/min (ref >60)

## 2009-04-27 ENCOUNTER — Ambulatory Visit: Payer: Self-pay | Admitting: Internal Medicine

## 2009-05-06 ENCOUNTER — Telehealth: Payer: Self-pay | Admitting: Internal Medicine

## 2009-05-19 ENCOUNTER — Telehealth: Payer: Self-pay | Admitting: Internal Medicine

## 2009-05-20 ENCOUNTER — Telehealth: Payer: Self-pay | Admitting: Internal Medicine

## 2009-05-21 ENCOUNTER — Encounter: Payer: Self-pay | Admitting: Internal Medicine

## 2009-05-27 ENCOUNTER — Telehealth: Payer: Self-pay | Admitting: Internal Medicine

## 2009-07-08 ENCOUNTER — Ambulatory Visit: Payer: Self-pay | Admitting: Internal Medicine

## 2009-08-02 ENCOUNTER — Ambulatory Visit: Payer: Self-pay | Admitting: Internal Medicine

## 2009-08-02 LAB — CONVERTED CEMR LAB
Albumin: 4.1 g/dL (ref 3.5–5.2)
Alkaline Phosphatase: 74 units/L (ref 39–117)
Calcium: 9.3 mg/dL (ref 8.4–10.5)
GFR calc non Af Amer: 69.63 mL/min (ref >60)
Glucose, Bld: 133 mg/dL — ABNORMAL HIGH (ref 70–99)
Hgb A1c MFr Bld: 7.5 % — ABNORMAL HIGH (ref 4.6–6.5)
Sodium: 138 meq/L (ref 135–145)
Total Bilirubin: 0.7 mg/dL (ref 0.3–1.2)

## 2009-08-11 ENCOUNTER — Ambulatory Visit: Payer: Self-pay | Admitting: Internal Medicine

## 2009-08-13 ENCOUNTER — Telehealth: Payer: Self-pay | Admitting: Internal Medicine

## 2009-08-16 ENCOUNTER — Telehealth: Payer: Self-pay | Admitting: Internal Medicine

## 2009-08-24 ENCOUNTER — Telehealth: Payer: Self-pay | Admitting: Internal Medicine

## 2009-09-07 ENCOUNTER — Telehealth: Payer: Self-pay | Admitting: Internal Medicine

## 2009-09-21 ENCOUNTER — Telehealth: Payer: Self-pay | Admitting: Internal Medicine

## 2009-09-22 ENCOUNTER — Ambulatory Visit: Payer: Self-pay | Admitting: Internal Medicine

## 2009-09-22 LAB — HM DIABETES FOOT EXAM

## 2009-10-07 ENCOUNTER — Ambulatory Visit: Payer: Self-pay | Admitting: Internal Medicine

## 2009-11-15 ENCOUNTER — Telehealth: Payer: Self-pay | Admitting: Internal Medicine

## 2009-11-17 ENCOUNTER — Telehealth: Payer: Self-pay | Admitting: Internal Medicine

## 2009-11-17 ENCOUNTER — Ambulatory Visit: Payer: Self-pay | Admitting: Internal Medicine

## 2009-11-17 LAB — CONVERTED CEMR LAB
AST: 23 units/L (ref 0–37)
Alkaline Phosphatase: 78 units/L (ref 39–117)
GFR calc non Af Amer: 69.55 mL/min (ref >60)
HCT: 47.6 % — ABNORMAL HIGH (ref 36.0–46.0)
Lymphocytes Relative: 46 % (ref 12–46)
Neutro Abs: 8.5 10*3/uL — ABNORMAL HIGH (ref 1.7–7.7)
Platelets: 334 10*3/uL (ref 150–400)
Potassium: 4 meq/L (ref 3.5–5.1)
RDW: 12.2 % (ref 11.5–15.5)
Sodium: 140 meq/L (ref 135–145)
Specific Gravity, Urine: >=1.03 (ref 1.000–1.030)
Total Bilirubin: 0.3 mg/dL (ref 0.3–1.2)
Total Protein, Urine: 30 mg/dL
Urine Glucose: NEGATIVE mg/dL
pH: 5 (ref 5.0–8.0)

## 2009-11-22 ENCOUNTER — Ambulatory Visit: Payer: Self-pay | Admitting: Internal Medicine

## 2009-11-22 DIAGNOSIS — R10814 Left lower quadrant abdominal tenderness: Secondary | ICD-10-CM

## 2009-11-23 ENCOUNTER — Ambulatory Visit: Payer: Self-pay | Admitting: Cardiovascular Disease

## 2009-11-24 ENCOUNTER — Ambulatory Visit: Payer: Self-pay | Admitting: Internal Medicine

## 2009-11-24 LAB — CONVERTED CEMR LAB
BUN: 12 mg/dL (ref 6–23)
Basophils Absolute: 0 10*3/uL (ref 0.0–0.1)
Chloride: 104 meq/L (ref 96–112)
Eosinophils Absolute: 0.2 10*3/uL (ref 0.0–0.7)
HCT: 45.6 % (ref 36.0–46.0)
Hemoglobin, Urine: NEGATIVE
Hemoglobin: 15 g/dL (ref 12.0–15.0)
Leukocytes, UA: NEGATIVE
MCV: 94.8 fL (ref 78.0–100.0)
Nitrite: NEGATIVE
Platelets: 331 10*3/uL (ref 150.0–400.0)
Potassium: 4.3 meq/L (ref 3.5–5.1)
Specific Gravity, Urine: >=1.03 (ref 1.000–1.030)
Urobilinogen, UA: 0.2 (ref 0.0–1.0)
WBC: 13.6 10*3/uL — ABNORMAL HIGH (ref 4.5–10.5)

## 2009-12-24 ENCOUNTER — Inpatient Hospital Stay (HOSPITAL_COMMUNITY): Admission: EM | Admit: 2009-12-24 | Discharge: 2009-12-27 | Payer: Self-pay | Admitting: Emergency Medicine

## 2009-12-31 ENCOUNTER — Ambulatory Visit: Payer: Self-pay | Admitting: Endocrinology

## 2010-01-25 ENCOUNTER — Ambulatory Visit: Payer: Self-pay | Admitting: Pulmonary Disease

## 2010-01-26 ENCOUNTER — Emergency Department (HOSPITAL_COMMUNITY)
Admission: EM | Admit: 2010-01-26 | Discharge: 2010-01-27 | Payer: Self-pay | Source: Home / Self Care | Admitting: Emergency Medicine

## 2010-01-27 ENCOUNTER — Telehealth: Payer: Self-pay | Admitting: Internal Medicine

## 2010-02-16 ENCOUNTER — Ambulatory Visit: Payer: Self-pay | Admitting: Internal Medicine

## 2010-02-17 ENCOUNTER — Encounter: Admission: RE | Admit: 2010-02-17 | Discharge: 2010-02-17 | Payer: Self-pay | Admitting: Internal Medicine

## 2010-02-22 ENCOUNTER — Telehealth: Payer: Self-pay | Admitting: Internal Medicine

## 2010-03-08 ENCOUNTER — Telehealth: Payer: Self-pay | Admitting: Internal Medicine

## 2010-03-09 ENCOUNTER — Ambulatory Visit: Payer: Self-pay | Admitting: Pulmonary Disease

## 2010-03-09 ENCOUNTER — Ambulatory Visit: Payer: Self-pay | Admitting: Internal Medicine

## 2010-03-09 LAB — CONVERTED CEMR LAB
ALT: 17 units/L (ref 0–35)
Bilirubin, Direct: 0 mg/dL (ref 0.0–0.3)
Total Bilirubin: 0.4 mg/dL (ref 0.3–1.2)

## 2010-03-10 LAB — CONVERTED CEMR LAB: HCV Ab: NEGATIVE

## 2010-03-11 ENCOUNTER — Telehealth: Payer: Self-pay | Admitting: Pulmonary Disease

## 2010-03-11 DIAGNOSIS — G4733 Obstructive sleep apnea (adult) (pediatric): Secondary | ICD-10-CM

## 2010-05-25 ENCOUNTER — Ambulatory Visit: Payer: Self-pay | Admitting: Internal Medicine

## 2010-05-25 DIAGNOSIS — M25519 Pain in unspecified shoulder: Secondary | ICD-10-CM

## 2010-05-25 DIAGNOSIS — N951 Menopausal and female climacteric states: Secondary | ICD-10-CM

## 2010-05-26 LAB — CONVERTED CEMR LAB
AST: 18 units/L (ref 0–37)
Albumin: 3.9 g/dL (ref 3.5–5.2)
Alkaline Phosphatase: 58 units/L (ref 39–117)
Basophils Relative: 0.7 % (ref 0.0–3.0)
CO2: 28 meq/L (ref 19–32)
Calcium: 9.3 mg/dL (ref 8.4–10.5)
Chloride: 102 meq/L (ref 96–112)
Eosinophils Absolute: 0.2 10*3/uL (ref 0.0–0.7)
HCT: 41.1 % (ref 36.0–46.0)
Hemoglobin, Urine: NEGATIVE
Hemoglobin: 13.9 g/dL (ref 12.0–15.0)
Ketones, ur: NEGATIVE mg/dL
Lymphocytes Relative: 40 % (ref 12.0–46.0)
Lymphs Abs: 3.5 10*3/uL (ref 0.7–4.0)
MCHC: 33.9 g/dL (ref 30.0–36.0)
Neutro Abs: 4.7 10*3/uL (ref 1.4–7.7)
Potassium: 4.5 meq/L (ref 3.5–5.1)
RBC: 4.69 M/uL (ref 3.87–5.11)
Sodium: 138 meq/L (ref 135–145)
Total Protein: 6.6 g/dL (ref 6.0–8.3)
Urine Glucose: NEGATIVE mg/dL
Urobilinogen, UA: 0.2 (ref 0.0–1.0)

## 2010-06-01 ENCOUNTER — Telehealth: Payer: Self-pay | Admitting: Internal Medicine

## 2010-06-10 ENCOUNTER — Ambulatory Visit: Payer: Self-pay | Admitting: Internal Medicine

## 2010-06-10 ENCOUNTER — Encounter: Payer: Self-pay | Admitting: Internal Medicine

## 2010-07-14 ENCOUNTER — Ambulatory Visit: Payer: Self-pay | Admitting: Psychology

## 2010-07-20 ENCOUNTER — Telehealth: Payer: Self-pay | Admitting: Internal Medicine

## 2010-07-20 ENCOUNTER — Ambulatory Visit: Payer: Self-pay | Admitting: Internal Medicine

## 2010-07-25 ENCOUNTER — Telehealth: Payer: Self-pay | Admitting: Internal Medicine

## 2010-07-26 ENCOUNTER — Ambulatory Visit: Payer: Self-pay | Admitting: Internal Medicine

## 2010-07-27 ENCOUNTER — Telehealth: Payer: Self-pay | Admitting: Internal Medicine

## 2010-07-27 LAB — CONVERTED CEMR LAB
AST: 20 units/L (ref 0–37)
Albumin: 3.7 g/dL (ref 3.5–5.2)
Alkaline Phosphatase: 55 units/L (ref 39–117)
CO2: 31 meq/L (ref 19–32)
Glucose, Bld: 98 mg/dL (ref 70–99)
Hgb A1c MFr Bld: 7.1 % — ABNORMAL HIGH (ref 4.6–6.5)
Microalb Creat Ratio: 0.7 mg/g (ref 0.0–30.0)
Microalb, Ur: 0.4 mg/dL (ref 0.0–1.9)
Potassium: 5 meq/L (ref 3.5–5.1)
Sodium: 141 meq/L (ref 135–145)
Total Protein: 6.4 g/dL (ref 6.0–8.3)
Vitamin B-12: 304 pg/mL (ref 211–911)

## 2010-07-28 ENCOUNTER — Ambulatory Visit: Payer: Self-pay | Admitting: Gastroenterology

## 2010-07-28 ENCOUNTER — Telehealth (INDEPENDENT_AMBULATORY_CARE_PROVIDER_SITE_OTHER): Payer: Self-pay | Admitting: *Deleted

## 2010-07-28 DIAGNOSIS — E669 Obesity, unspecified: Secondary | ICD-10-CM

## 2010-07-28 DIAGNOSIS — R1013 Epigastric pain: Secondary | ICD-10-CM

## 2010-07-29 ENCOUNTER — Telehealth: Payer: Self-pay | Admitting: Physician Assistant

## 2010-08-04 ENCOUNTER — Telehealth: Payer: Self-pay | Admitting: Internal Medicine

## 2010-08-09 ENCOUNTER — Ambulatory Visit (HOSPITAL_COMMUNITY)
Admission: RE | Admit: 2010-08-09 | Discharge: 2010-08-09 | Payer: Self-pay | Source: Home / Self Care | Admitting: Gastroenterology

## 2010-08-12 ENCOUNTER — Telehealth (INDEPENDENT_AMBULATORY_CARE_PROVIDER_SITE_OTHER): Payer: Self-pay | Admitting: *Deleted

## 2010-09-06 ENCOUNTER — Encounter: Payer: Self-pay | Admitting: Internal Medicine

## 2010-09-06 ENCOUNTER — Telehealth: Payer: Self-pay | Admitting: Physician Assistant

## 2010-09-08 ENCOUNTER — Encounter: Payer: Self-pay | Admitting: Physician Assistant

## 2010-09-13 ENCOUNTER — Encounter: Payer: Self-pay | Admitting: Physician Assistant

## 2010-09-27 ENCOUNTER — Telehealth: Payer: Self-pay | Admitting: Internal Medicine

## 2010-09-28 ENCOUNTER — Other Ambulatory Visit: Payer: Self-pay | Admitting: Internal Medicine

## 2010-09-28 ENCOUNTER — Ambulatory Visit
Admission: RE | Admit: 2010-09-28 | Discharge: 2010-09-28 | Payer: Self-pay | Source: Home / Self Care | Attending: Internal Medicine | Admitting: Internal Medicine

## 2010-09-28 LAB — CBC WITH DIFFERENTIAL/PLATELET
Basophils Absolute: 0.1 10*3/uL (ref 0.0–0.1)
Basophils Relative: 0.9 % (ref 0.0–3.0)
Eosinophils Absolute: 0.1 10*3/uL (ref 0.0–0.7)
Eosinophils Relative: 1.7 % (ref 0.0–5.0)
HCT: 39.9 % (ref 36.0–46.0)
Hemoglobin: 13.6 g/dL (ref 12.0–15.0)
Lymphocytes Relative: 36.4 % (ref 12.0–46.0)
Lymphs Abs: 3.1 10*3/uL (ref 0.7–4.0)
MCHC: 34.2 g/dL (ref 30.0–36.0)
MCV: 88.5 fl (ref 78.0–100.0)
Monocytes Absolute: 0.3 10*3/uL (ref 0.1–1.0)
Monocytes Relative: 4.1 % (ref 3.0–12.0)
Neutro Abs: 4.8 10*3/uL (ref 1.4–7.7)
Neutrophils Relative %: 56.9 % (ref 43.0–77.0)
Platelets: 275 10*3/uL (ref 150.0–400.0)
RBC: 4.51 Mil/uL (ref 3.87–5.11)
RDW: 12.1 % (ref 11.5–14.6)
WBC: 8.4 10*3/uL (ref 4.5–10.5)

## 2010-09-28 LAB — COMPREHENSIVE METABOLIC PANEL
ALT: 16 U/L (ref 0–35)
AST: 22 U/L (ref 0–37)
Albumin: 3.5 g/dL (ref 3.5–5.2)
Alkaline Phosphatase: 57 U/L (ref 39–117)
BUN: 12 mg/dL (ref 6–23)
CO2: 28 mEq/L (ref 19–32)
Calcium: 9.4 mg/dL (ref 8.4–10.5)
Chloride: 104 mEq/L (ref 96–112)
Creatinine, Ser: 0.7 mg/dL (ref 0.4–1.2)
GFR: 97.45 mL/min (ref >60.00)
Glucose, Bld: 131 mg/dL — ABNORMAL HIGH (ref 70–99)
Potassium: 4.6 mEq/L (ref 3.5–5.1)
Sodium: 138 mEq/L (ref 135–145)
Total Bilirubin: 0.5 mg/dL (ref 0.3–1.2)
Total Protein: 6.6 g/dL (ref 6.0–8.3)

## 2010-09-29 ENCOUNTER — Ambulatory Visit
Admission: RE | Admit: 2010-09-29 | Discharge: 2010-09-29 | Payer: Self-pay | Source: Home / Self Care | Attending: Gastroenterology | Admitting: Gastroenterology

## 2010-10-03 ENCOUNTER — Telehealth: Payer: Self-pay | Admitting: Internal Medicine

## 2010-10-10 ENCOUNTER — Ambulatory Visit: Admit: 2010-10-10 | Payer: Self-pay | Admitting: Internal Medicine

## 2010-10-12 ENCOUNTER — Telehealth: Payer: Self-pay | Admitting: Internal Medicine

## 2010-10-12 ENCOUNTER — Telehealth: Payer: Self-pay | Admitting: Physician Assistant

## 2010-10-14 ENCOUNTER — Ambulatory Visit (HOSPITAL_COMMUNITY): Admission: RE | Admit: 2010-10-14 | Payer: Self-pay | Source: Home / Self Care | Admitting: Gastroenterology

## 2010-10-15 ENCOUNTER — Encounter: Payer: Self-pay | Admitting: Internal Medicine

## 2010-10-16 ENCOUNTER — Encounter: Payer: Self-pay | Admitting: Internal Medicine

## 2010-10-17 ENCOUNTER — Encounter: Payer: Self-pay | Admitting: Gastroenterology

## 2010-10-21 ENCOUNTER — Ambulatory Visit: Admit: 2010-10-21 | Payer: Self-pay | Admitting: Internal Medicine

## 2010-10-25 NOTE — Progress Notes (Signed)
Summary: Triage  Phone Note Call from Patient Call back at Home Phone [phone]   Caller: Patient Call For: [name] Reason for Call: Talk to Nurse Summary of Call: Wanted make you aware that she changed her gastric emptying test from 08-16-10 to 08-08-10..."do you see any problem with her bumping her appt. sooner?" Initial call taken by: [name],  July 29, 2010 3:00 PM  Follow-up for Phone Call        Left [name] a message on her home phone.   I advised [name] on the message that I did not see a problem with her moving her Gastric Emtpying Scan appt from 11-22 to 11-14.   Follow-up by: [name],  August 01, 2010 8:34 AM

## 2010-10-25 NOTE — Progress Notes (Signed)
Summary: Triage  Phone Note Call from Patient Call back at Home Phone [phone]   Caller: Patient Call For: Dr. Juanda Chance Reason for Call: Talk to Nurse Summary of Call: Upper gastric pain(left side), nausea, bloating x2wks Initial call taken by: [name],  July 27, 2010 4:31 PM  Follow-up for Phone Call        Patient c/o pain in left upper quad, nausea and bloating for 3 weeks. States she has diarrhea off and on. Pain goes away for a few days and then is back. Using Levbid, Aciphex, Valium and Mylanta without relief. Has also tried full liquid diet. Scheduled patient with [name], PA on 07/28/10 at 09:30. Patient aware of appointment. Follow-up by: Jesse Fall RN,  July 27, 2010 4:55 PM

## 2010-10-25 NOTE — Assessment & Plan Note (Signed)
Summary: WEAK/ BREATHING PROBLEMS/ NWS   Vital Signs:  Patient profile:   55 year old female Height:      65 inches (165.10 cm) Weight:      243 pounds (110.45 kg) O2 Sat:      94 % on Room air Temp:     97.6 degrees F (36.44 degrees C) oral Pulse rate:   74 / minute BP sitting:   82 / 68  (left arm) Cuff size:   large  Vitals Entered By: Josph Macho RMA (December 31, 2009 3:35 PM)  O2 Flow:  Room air CC: Weak, SOB/ pt states she fell this morning/ pt states she is no longer taking Welchol or Levaquin/ BP second reading 96/60 left arm/ Third BP 96/62/CF Is Patient Diabetic? Yes   Primary Provider:  Sonda Primes, MD  CC:  Weak and SOB/ pt states she fell this morning/ pt states she is no longer taking Welchol or Levaquin/ BP second reading 96/60 left arm/ Third BP 96/62/CF.  History of Present Illness: see above since her recent admission for copd exacerbation, she says her sob is much better.  she still has wheezing, however.  she has not smoked since hosp admission. she has few days of itching at the site of an injection at the right thigh in the hospital. no cbg record, but states cbg's are 100-200's.  it is in general higher as the day goes on. pt says her fibromyalgia pain is worse since she was in the hospital.  Current Medications (verified): 1)  Metformin Hcl 500 Mg  Tb24 (Metformin Hcl) .Marland Kitchen.. 1 By Mouth Bid 2)  Vicodin Hp 10-660 Mg  Tabs (Hydrocodone-Acetaminophen) .Marland Kitchen.. 1 Po Qid As Needed 3)  Valium 5 Mg  Tabs (Diazepam) .Marland Kitchen.. 1 Po Bid As Needed 4)  Zanaflex 4 Mg  Tabs (Tizanidine Hcl) .... Take 1 Tablet By Mouth Three Times A Day 5)  Promethazine Hcl 25 Mg  Tabs (Promethazine Hcl) .Marland Kitchen.. 1 or 1/2  Q 6h As Needed Nausea 6)  Furosemide 20 Mg  Tabs (Furosemide) .Marland Kitchen.. 1 By Mouth Once Daily Prn 7)  Cymbalta 60 Mg Cpep (Duloxetine Hcl) .Marland Kitchen.. 1 Tablet By Mouth Once Daily 8)  Vitamin D3 1000 Unit  Tabs (Cholecalciferol) .... 2000 International Units Once Daily 9)  Proair Hfa  108 (90 Base) Mcg/act  Aers (Albuterol Sulfate) .... 2 Inh Q4h As Needed Shortness of Breath 10)  Welchol 625 Mg  Tabs (Colesevelam Hcl) .... 1/2 Tablet By Mouth Once Daily Diarrhea 11)  Singulair 10 Mg  Tabs (Montelukast Sodium) .... Take 1 By Mouth Qd 12)  Premarin 0.9 Mg  Tabs (Estrogens Conjugated) .Marland Kitchen.. 1 By Mouth Qd 13)  Vistaril 50 Mg  Caps (Hydroxyzine Pamoate) .... 2  By Mouth At Bedtime As Needed Insomnia 14)  Align  Caps (Misc Intestinal Flora Regulat) .... Once Daily 15)  Aciphex 20 Mg Tbec (Rabeprazole Sodium) .... Take 1 Tablet By Mouth Once A Day 16)  Onetouch Ultra Test  Strp (Glucose Blood) .... Use Once To Twice A Day As Directed 17)  Levbid 0.375 Mg Xr12h-Tab (Hyoscyamine Sulfate) .... Take One By Mouth Every 12 Hours As Needed For Abd. Pain. 18)  Restasis 0.05 % Emul (Cyclosporine) .Marland Kitchen.. 1 Drop Each Eye Once Daily 19)  Duoneb 0.5-2.5 (3) Mg/38ml Soln (Ipratropium-Albuterol) .Marland Kitchen.. 1 Via Hhn Qid Prn 20)  Dulera 100-5 Mcg/act Aero (Mometasone Furo-Formoterol Fum) .... 2 Puffs Bid 21)  Cortisone Shot .... Q 3 Months 22)  Promethazine-Codeine 6.25-10  Mg/108ml Syrp (Promethazine-Codeine) .... 5-10 Ml By Mouth Q Id As Needed Cough 23)  Levaquin 500 Mg Tabs (Levofloxacin) .Marland Kitchen.. 1 By Mouth Qd 24)  Meperidine Hcl 50 Mg Tabs (Meperidine Hcl) .Marland Kitchen.. 1 By Mouth Three Times A Day As Needed Severe Pain 25)  Prednisone 10 Mg Tabs (Prednisone) .... Once Daily 26)  Doxycycline Hyclate 100 Mg Tabs (Doxycycline Hyclate) .Marland Kitchen.. 1 Two Times A Day 27)  Spiriva Handihaler 18 Mcg Caps (Tiotropium Bromide Monohydrate)  Allergies (verified): 1)  ! Avelox (Moxifloxacin Hcl) 2)  ! Topamax (Topiramate) 3)  ! Neomycin-Bacitracin Zn-Polymyx (Neomycin-Bacitracin Zn-Polymyx) 4)  ! Toradol 5)  ! * Dilaudid 6)  ! Lyrica (Pregabalin) 7)  Actos (Pioglitazone Hcl) 8)  Augmentin (Amoxicillin-Pot Clavulanate)  Past History:  Past Medical History: Last updated: 01/19/2009 Asthma COPD Depression GERD  Dr  Juanda Chance Migraines Menopause IBS FMS Possible MRSA-  skin (by verbal report from pt) Diabetes mellitus, type II Peripheral neuropathy Gastroparesis  Review of Systems  The patient denies syncope.         denies n/v  Physical Exam  General:  morbidly obese.  no distress in wheelchair Lungs:  Clear to auscultation bilaterally. Normal respiratory effort.  Skin:  there is a 7x3 cm area of eczematous rash on the right anterior thigh.   Impression & Recommendations:  Problem # 1:  COPD (ICD-496) Assessment Improved  Problem # 2:  DIABETES MELLITUS, TYPE II (ICD-250.00) needs increased rx  Problem # 3:  pruritis ? due to injection  Problem # 4:  FIBROMYALGIA (ICD-729.1) eac by her recent illness  Problem # 5:  hypotension mild and asymptomatic  Medications Added to Medication List This Visit: 1)  Metformin Hcl 500 Mg Tb24 (Metformin hcl) .... 2 tabs by mouth two times a day 2)  Vistaril 50 Mg Caps (Hydroxyzine pamoate) .... 2  by mouth at bedtime as needed insomnia 3)  Doxycycline Hyclate 100 Mg Tabs (Doxycycline hyclate) .Marland Kitchen.. 1 two times a day 4)  Spiriva Handihaler 18 Mcg Caps (Tiotropium bromide monohydrate) 5)  Prednisone 5 Mg Tabs (Prednisone) .Marland Kitchen.. 1 once daily 6)  Triamcinolone Acetonide 0.1 % Crea (Triamcinolone acetonide) .... Three times a day as needed rash  Other Orders: Pulmonary Referral (Pulmonary) Est. Patient Level IV ([address]109)  Patient Instructions: 1)  refer pulmonary dr.  you will be called with a day and time for an appointment. 2)  for now, reduce prednisone to 5 mg once daily. 3)  triamcinolone cream three times a day to the right thigh as needed for itching 4)  increase metformin to 2x500 mg two times a day. 5)  same pain medication for now. 6)  see dr Posey Rea as scheduled next month Prescriptions: METFORMIN HCL 500 MG  TB24 (METFORMIN HCL) 2 tabs by mouth two times a day  #120 x 11   Entered and Authorized by:   Minus Breeding MD   Signed  by:   Minus Breeding MD on 12/31/2009   Method used:   Electronically to        [facility]  [address] [id number] (retail)       [address]       [place], [place]  [address]       Ph: [phone] or [phone]       Fax: [phone]   RxID:   [id number] TRIAMCINOLONE ACETONIDE 0.1 % CREA (TRIAMCINOLONE ACETONIDE) three times a day as needed rash  #1 med tube x  0   Entered and Authorized by:   Minus Breeding MD   Signed by:   Minus Breeding MD on 12/31/2009   Method used:   Electronically to        [id number] (retail)       [address]       [place], [place]  [address]       Ph: [phone] or [phone]       Fax: [phone]   RxID:   [id number]   Appended Document: Orders Update    Clinical Lists Changes  Orders: Added new Service order of Prescription Created Electronically [id number]) - Signed

## 2010-10-25 NOTE — Miscellaneous (Signed)
Summary: BONE DENSITY  Clinical Lists Changes  Orders: Added new Test order of T-Lumbar Vertebral Assessment (77082) - Signed 

## 2010-10-25 NOTE — Progress Notes (Signed)
Summary: Rf Hydroxyzine  Phone Note Refill Request Message from:  Pharmacy  Refills Requested: Medication #1:  VISTARIL 50 MG  CAPS 2  by mouth at bedtime as needed insomnia   Dosage confirmed as above?Dosage Confirmed   Supply Requested: 60  Method Requested: Electronic Initial call taken by: Lanier Prude, [facility]),  June 01, 2010 9:25 AM  Follow-up for Phone Call        ok 3 ref Follow-up by: Tresa Garter MD,  June 01, 2010 5:54 PM    Prescriptions: VISTARIL 50 MG  CAPS (HYDROXYZINE PAMOATE) 2  by mouth at bedtime as needed insomnia  #60 x 3   Entered by:   Lamar Sprinkles, CMA   Authorized by:   Tresa Garter MD   Signed by:   Lamar Sprinkles, CMA on 06/01/2010   Method used:   [facility]ically to        [facility]  [id number]       [address]       [place], [place]  [address]       Ph: [phone] or [phone]       Fax: [phone]   RxID:   [id number]

## 2010-10-25 NOTE — Assessment & Plan Note (Signed)
Summary: 3 MO ROV /NWS  #   Vital Signs:  Patient profile:   55 year old female Height:      65 inches Weight:      250 pounds Temp:     97.8 degrees F oral Pulse rate:   68 / minute Pulse rhythm:   regular Resp:     16 per minute BP sitting:   120 / 60  (left arm) Cuff size:   large  Vitals Entered By: Jarome Lamas (May 25, 2010 11:30 AM) CC: 3 mo f/up c/o fainting w/low blood sugar Is Patient Diabetic? Yes   Primary Care Provider:  Sonda Primes, MD  CC:  3 mo f/up c/o fainting w/low blood sugar.  History of Present Illness: The patient presents for a follow up of hypertension, diabetes, hyperlipidemia, FMS, depression Stressed: aunt w/MM dying. Fell x 2 on R shoulder  Preventive Screening-Counseling & Management  Alcohol-Tobacco     Smoking Status: quit  Current Medications (verified): 1)  Metformin Hcl 500 Mg  Tb24 (Metformin Hcl) .... Take 1 Tablet By Mouth Two Times A Day 2)  Vicodin Hp 10-660 Mg  Tabs (Hydrocodone-Acetaminophen) .... Take 1 Tablet By Mouth Once A Day and As Needed 3)  Valium 5 Mg  Tabs (Diazepam) .... Take 2 Tab By Mouth At Bedtime and As Needed 4)  Zanaflex 4 Mg  Tabs (Tizanidine Hcl) .... Take 1 Tablet By Mouth Three Times A Day 5)  Promethazine Hcl 25 Mg  Tabs (Promethazine Hcl) .Marland Kitchen.. 1 or 1/2  Q 6h As Needed Nausea 6)  Furosemide 20 Mg  Tabs (Furosemide) .Marland Kitchen.. 1 By Mouth Once Daily Prn 7)  Cymbalta 60 Mg Cpep (Duloxetine Hcl) .Marland Kitchen.. 1 Tablet By Mouth Once Daily 8)  Vitamin D3 1000 Unit  Tabs (Cholecalciferol) .... 2000 International Units Once Daily 9)  Proair Hfa 108 (90 Base) Mcg/act  Aers (Albuterol Sulfate) .... 2 Inh Q4h As Needed Shortness of Breath 10)  Zyrtec Hives Relief 10 Mg Tabs (Cetirizine Hcl) .... Take 1 Tablet By Mouth Once A Day 11)  Premarin 0.9 Mg  Tabs (Estrogens Conjugated) .Marland Kitchen.. 1 By Mouth Qd 12)  Vistaril 50 Mg  Caps (Hydroxyzine Pamoate) .... 2  By Mouth At Bedtime As Needed Insomnia 13)  Align  Caps (Misc Intestinal  Flora Regulat) .... Once Daily 14)  Aciphex 20 Mg Tbec (Rabeprazole Sodium) .... Take 1 Tablet By Mouth Once A Day 15)  Onetouch Ultra Test  Strp (Glucose Blood) .... Use Once To Twice A Day As Directed 16)  Levbid 0.375 Mg Xr12h-Tab (Hyoscyamine Sulfate) .... Take One By Mouth Every 12 Hours As Needed For Abd. Pain. 17)  Restasis 0.05 % Emul (Cyclosporine) .Marland Kitchen.. 1 Drop Each Eye Once Daily 18)  Duoneb 0.5-2.5 (3) Mg/53ml Soln (Ipratropium-Albuterol) .Marland Kitchen.. 1 Via Hhn Qid Prn 19)  Cortisone Shot .... Q 3 Months 20)  Promethazine-Codeine 6.25-10 Mg/74ml Syrp (Promethazine-Codeine) .... 5-10 Ml By Mouth Q Id As Needed Cough 21)  Triamcinolone Acetonide 0.1 % Crea (Triamcinolone Acetonide) .... Three Times A Day As Needed Rash  Allergies (verified): 1)  ! Avelox (Moxifloxacin Hcl) 2)  ! Topamax (Topiramate) 3)  ! Neomycin-Bacitracin Zn-Polymyx (Neomycin-Bacitracin Zn-Polymyx) 4)  ! Toradol 5)  ! * Dilaudid 6)  ! Lyrica (Pregabalin) 7)  ! * Ivp Dye 8)  Actos (Pioglitazone Hcl) 9)  Augmentin (Amoxicillin-Pot Clavulanate)  Past History:  Past Medical History: Last updated: 01/19/2009 Asthma COPD Depression GERD  Dr Juanda Chance Migraines Menopause IBS FMS Possible MRSA-  skin (by verbal report from pt) Diabetes mellitus, type II Peripheral neuropathy Gastroparesis  Past Surgical History: Last updated: 02/02/2009 Hysterectomy  complete Sinus surgery  x 2 abd u/s colonoscopy  1994 Nissen Fundoplication  1999 Surgery for Left heel spur Appendectomy VENTRAL HERNIA REPAIR WITH MESH Cholecystectomy  Family History: Last updated: 04/16/2008 Family History of Arthritis Family History Diabetes 1st degree relative Mother and Father MGrandparents Family History Hypertension B MI 71 Family History of Heart Disease: Maternal Grandmother brother died MI Family History of Breast Cancer: Maternal Grandmother Family History of Colon Cancer:MGF PGF Family History of Colon Polyps:father PGF and  MGF Lung Cancer-mother died age 79 Family History of Uterine Cancer:Great grandmother Family History of Esophageal Cancer:father  Social History: Occupation: CMA disabled Married, lives with husband and grandchild Children: yes 3 Regular exercise-no Alcohol Use - no Daily Caffeine Use Patient states former smoker. (stopped April 2011) Former Smoker 4/11  Review of Systems       The patient complains of abdominal pain, severe indigestion/heartburn, and depression.  The patient denies fever, chest pain, hemoptysis, and melena.         LBP  Physical Exam  General:  NAD, not  pale,  less sweaty Head:  normocephalic, atraumatic, no abnormalities observed, and no abnormalities palpated.   Ears:  R ear normal and L ear normal.   Nose:  External nasal examination shows no deformity or inflammation. Nasal mucosa are pink and moist without lesions or exudates. Mouth:  No erythematous throat mucosa and intranasal erythema.  Neck:  supple, full ROM, no masses, no thyromegaly, no thyroid nodules or tenderness, no JVD, normal carotid upstroke, no carotid bruits, and no cervical lymphadenopathy.   Lungs:  No wheezes Heart:  RRR Abdomen:  R groin is a little tender; no masses, no rebound tenderness, no inguinal hernia, no hepatomegaly, and no splenomegaly.   Msk:  Lumbar-sacral spine is tender to palpation over paraspinal muscles and painfull with the ROM  R shoulder is tender w/ROM Neurologic:  No cranial nerve deficits noted. Station and gait are normal. Plantar reflexes are down-going bilaterally. DTRs are symmetrical throughout. Sensory, motor and coordinative functions appear intact. Skin:  turgor normal, color normal,  no ecchymoses, no petechiae, no purpura, no ulcerations, and no edema.  Intertrigo present Psych:  Oriented X3, not suicidal, and subdued.     Impression & Recommendations:  Problem # 1:  MENOPAUSAL SYNDROME (ICD-627.2) Assessment Unchanged  Her updated medication  list for this problem includes:    Premarin 0.9 Mg Tabs (Estrogens conjugated) .Marland Kitchen... 1 by mouth qd  Orders: T-Bone Densitometry [phone])  Problem # 2:  DIABETES MELLITUS, TYPE II (ICD-250.00) Assessment: Unchanged  Her updated medication list for this problem includes:    Metformin Hcl 500 Mg Tb24 (Metformin hcl) .Marland Kitchen... Take 1 tablet by mouth two times a day  Orders: TLB-BMP (Basic Metabolic Panel-BMET) (80048-METABOL) TLB-CBC Platelet - w/Differential (85025-CBCD) TLB-Hepatic/Liver Function Pnl (80076-HEPATIC) TLB-TSH (Thyroid Stimulating Hormone) (84443-TSH) TLB-Udip ONLY (81003-UDIP) TLB-A1C / Hgb A1C (Glycohemoglobin) (83036-A1C) TLB-Microalbumin/Creat Ratio, Urine (82043-MALB)  Problem # 3:  FIBROMYALGIA (ICD-729.1) Assessment: Unchanged  Her updated medication list for this problem includes:    Vicodin Hp 10-660 Mg Tabs (Hydrocodone-acetaminophen) .Marland Kitchen... Take 1 tablet by mouth once a day and as needed    Zanaflex 4 Mg Tabs (Tizanidine hcl) .Marland Kitchen... Take 1 tablet by mouth three times a day  Problem # 4:  DIARRHEA (ICD-787.91) Assessment: Unchanged  Her updated medication list for this problem includes:  Align Caps (Misc intestinal flora regulat) ..... Once daily  Orders: TLB-BMP (Basic Metabolic Panel-BMET) (80048-METABOL) TLB-CBC Platelet - w/Differential (85025-CBCD) TLB-Hepatic/Liver Function Pnl (80076-HEPATIC) TLB-TSH (Thyroid Stimulating Hormone) (84443-TSH) TLB-Udip ONLY (81003-UDIP) TLB-A1C / Hgb A1C (Glycohemoglobin) (83036-A1C)  Problem # 5:  ASTHMA (ICD-493.90) Assessment: Unchanged  Her updated medication list for this problem includes:    Proair Hfa 108 (90 Base) Mcg/act Aers (Albuterol sulfate) .Marland Kitchen... 2 inh q4h as needed shortness of breath    Duoneb 0.5-2.5 (3) Mg/47ml Soln (Ipratropium-albuterol) .Marland Kitchen... 1 via hhn qid prn  Problem # 6:  SHOULDER PAIN (ICD-719.41) R MSK Assessment: New  Her updated medication list for this problem includes:    Vicodin  Hp 10-660 Mg Tabs (Hydrocodone-acetaminophen) .Marland Kitchen... Take 1 tablet by mouth once a day and as needed    Zanaflex 4 Mg Tabs (Tizanidine hcl) .Marland Kitchen... Take 1 tablet by mouth three times a day  Complete Medication List: 1)  Metformin Hcl 500 Mg Tb24 (Metformin hcl) .... Take 1 tablet by mouth two times a day 2)  Vicodin Hp 10-660 Mg Tabs (Hydrocodone-acetaminophen) .... Take 1 tablet by mouth once a day and as needed 3)  Valium 5 Mg Tabs (Diazepam) .... Take 2 tab by mouth at bedtime and as needed 4)  Zanaflex 4 Mg Tabs (Tizanidine hcl) .... Take 1 tablet by mouth three times a day 5)  Promethazine Hcl 25 Mg Tabs (Promethazine hcl) .Marland Kitchen.. 1 or 1/2  q 6h as needed nausea 6)  Furosemide 20 Mg Tabs (Furosemide) .Marland Kitchen.. 1 by mouth once daily prn 7)  Cymbalta 60 Mg Cpep (Duloxetine hcl) .Marland Kitchen.. 1 tablet by mouth once daily 8)  Vitamin D3 1000 Unit Tabs (Cholecalciferol) .... 2000 international units once daily 9)  Proair Hfa 108 (90 Base) Mcg/act Aers (Albuterol sulfate) .... 2 inh q4h as needed shortness of breath 10)  Zyrtec Hives Relief 10 Mg Tabs (Cetirizine hcl) .... Take 1 tablet by mouth once a day 11)  Premarin 0.9 Mg Tabs (Estrogens conjugated) .Marland Kitchen.. 1 by mouth qd 12)  Vistaril 50 Mg Caps (Hydroxyzine pamoate) .... 2  by mouth at bedtime as needed insomnia 13)  Align Caps (Misc intestinal flora regulat) .... Once daily 14)  Aciphex 20 Mg Tbec (Rabeprazole sodium) .... Take 1 tablet by mouth once a day 15)  Onetouch Ultra Test Strp (Glucose blood) .... Use once to twice a day as directed 16)  Levbid 0.375 Mg Xr12h-tab (Hyoscyamine sulfate) .... Take one by mouth every 12 hours as needed for abd. pain. 17)  Restasis 0.05 % Emul (Cyclosporine) .Marland Kitchen.. 1 drop each eye once daily 18)  Duoneb 0.5-2.5 (3) Mg/86ml Soln (Ipratropium-albuterol) .Marland Kitchen.. 1 via hhn qid prn 19)  Cortisone Shot  .... Q 3 months 20)  Promethazine-codeine 6.25-10 Mg/46ml Syrp (Promethazine-codeine) .... 5-10 ml by mouth q id as needed cough 21)   Triamcinolone Acetonide 0.1 % Crea (Triamcinolone acetonide) .... Three times a day as needed rash  Contraindications/Deferment of Procedures/Staging:    Test/Procedure: FLU VAX    Reason for deferment: patient declined   Patient Instructions: 1)  Please schedule a follow-up appointment in 3 months.

## 2010-10-25 NOTE — Progress Notes (Signed)
Summary: TRIAGE  Phone Note Call from Patient Call back at Home Phone [phone]   Caller: Patient Call For: Dr. 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 would like to speak to a nurse about these concerns Initial call taken by: Vallarie Mare,  March 08, 2010 11:02 AM  Follow-up for Phone Call        Pt's daughter is positive for Hep.B, she is in rehab and has been in and out of jail for drug use.  Pt. states her daughter has stayed with her periodically, she is concerned and wonders if she needs labs? "With my low immune system I am worried"  DR.BRODIE PLEASE ADVISE  Follow-up by: Laureen Ochs LPN,  March 08, 2010 11:14 AM  Additional Follow-up for Phone Call Additional follow up Details #1::        If she is concerned, she ought to have Hepatitis B serology  ( surface antigen, antibody and core antibody). If she had a Hep B vaccine like most health professionals have , then she  is immune to Hep B. and would not need the blood tests. 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 with above MD instructions. The lab orders are in [facility], pt. aware she may come for labs today. Pt. instructed to call back as needed.

## 2010-10-25 NOTE — Assessment & Plan Note (Signed)
Summary: COLD IS GETTING WORSE,BRONCHITIS-LB   Vital Signs:  Patient profile:   55 year old female Weight:      239 pounds Temp:     97.1 degrees F oral Pulse rate:   111 / minute BP sitting:   110 / 70  (left arm)  Vitals Entered By: Tora Perches (October 07, 2009 3:00 PM) CC: on going cold sx Is Patient Diabetic? Yes   Primary Care Provider:  Sonda Primes, MD  CC:  on going cold sx.  History of Present Illness: The patient presents with complaints of sore throat, fever, cough, sinus congestion and drainge of 2-3 wks duration. Was  better with abx/pred  meds. Chest hurts with coughing. Can't sleep due to cough. Muscle aches are present.  The mucus is colored. Worse now. F/u FMS, asthma  Current Medications (verified): 1)  Metformin Hcl 500 Mg  Tb24 (Metformin Hcl) .Marland Kitchen.. 1 By Mouth Bid 2)  Vicodin Hp 10-660 Mg  Tabs (Hydrocodone-Acetaminophen) .Marland Kitchen.. 1 Po Qid As Needed 3)  Valium 5 Mg  Tabs (Diazepam) .Marland Kitchen.. 1 Po Bid As Needed 4)  Zanaflex 4 Mg  Tabs (Tizanidine Hcl) .Marland Kitchen.. 1 By Mouth Once Daily As Needed Three Times A Day 5)  Promethazine Hcl 25 Mg  Tabs (Promethazine Hcl) .Marland Kitchen.. 1 or 1/2  Q 6h As Needed Nausea 6)  Furosemide 20 Mg  Tabs (Furosemide) .Marland Kitchen.. 1 By Mouth Once Daily Prn 7)  Cymbalta 60 Mg Cpep (Duloxetine Hcl) .Marland Kitchen.. 1 Tablet By Mouth Once Daily 8)  Vitamin D3 1000 Unit  Tabs (Cholecalciferol) .... 2000 International Units Once Daily 9)  Proair Hfa 108 (90 Base) Mcg/act  Aers (Albuterol Sulfate) .... 2 Inh Q4h As Needed Shortness of Breath 10)  Welchol 625 Mg  Tabs (Colesevelam Hcl) .... 1/2 Tablet By Mouth Once Daily Diarrhea 11)  Singulair 10 Mg  Tabs (Montelukast Sodium) .... Take 1 By Mouth Qd 12)  Premarin 0.9 Mg  Tabs (Estrogens Conjugated) .Marland Kitchen.. 1 By Mouth Qd 13)  Vistaril 50 Mg  Caps (Hydroxyzine Pamoate) .Marland Kitchen.. 1-2  By Mouth At Bedtime As Needed Insomnia 14)  Salacyn 6 %  Crea (Salicylic Acid) .... Use Bid 15)  Align  Caps (Misc Intestinal Flora Regulat) .... Once  Daily 16)  Aciphex 20 Mg Tbec (Rabeprazole Sodium) .... Take 1 Tablet By Mouth Once A Day 17)  Onetouch Ultra Test  Strp (Glucose Blood) .... Use Once To Twice A Day As Directed 18)  Triamcinolone Acetonide 0.5 % Crea (Triamcinolone Acetonide) .... Apply Bid To Affected Area 19)  Levbid 0.375 Mg Xr12h-Tab (Hyoscyamine Sulfate) .... Take One By Mouth Every 12 Hours As Needed For Abd. Pain. 20)  Cortisone Shot .... Q 3 Months 21)  Restasis 0.05 % Emul (Cyclosporine) .Marland Kitchen.. 1 Drop Each Eye Once Daily 22)  Duoneb 0.5-2.5 (3) Mg/47ml Soln (Ipratropium-Albuterol) .Marland Kitchen.. 1 Via Hhn Qid Prn 23)  Dulera 100-5 Mcg/act Aero (Mometasone Furo-Formoterol Fum) .... 2 Puffs Bid  Allergies: 1)  ! Avelox (Moxifloxacin Hcl) 2)  ! Topamax (Topiramate) 3)  ! Neomycin-Bacitracin Zn-Polymyx (Neomycin-Bacitracin Zn-Polymyx) 4)  ! Toradol 5)  ! * Dilaudid 6)  ! Lyrica (Pregabalin) 7)  Actos (Pioglitazone Hcl)  Past History:  Past Medical History: Last updated: 01/19/2009 Asthma COPD Depression GERD  Dr Juanda Chance Migraines Menopause IBS FMS Possible MRSA-  skin (by verbal report from pt) Diabetes mellitus, type II Peripheral neuropathy Gastroparesis  Social History: Last updated: 04/16/2008 Occupation: RN disabled Married Current Smoker Regular exercise-no Alcohol Use - no  Daily Caffeine Use  Review of Systems       The patient complains of chest pain, dyspnea on exertion, and prolonged cough.  The patient denies fever.         no diarrhea  Physical Exam  General:  NAD, pale Eyes:  No corneal or conjunctival inflammation noted. EOMI. Perrla.  Mouth:  Erythematous throat mucosa and intranasal erythema.  Lungs:  Mild B wheezes Heart:  RRR Abdomen:  soft, non-tender, normal bowel sounds, no distention, no masses, no guarding, no rigidity, no hepatomegaly, and no splenomegaly.   Msk:  Lumbar-sacral spine is tender to palpation over paraspinal muscles and painfull with the ROM  Extremities:  No  clubbing, cyanosis, edema, or deformity noted with normal full range of motion of all joints.   Neurologic:  No cranial nerve deficits noted. Station and gait are normal. Plantar reflexes are down-going bilaterally. DTRs are symmetrical throughout. Sensory, motor and coordinative functions appear intact. Skin:  turgor normal, color normal, no rashes, no suspicious lesions, no ecchymoses, no petechiae, no purpura, no ulcerations, and no edema.   Psych:  Cognition and judgment appear intact. Alert and cooperative with normal attention span and concentration. No apparent delusions, illusions, hallucinations   Impression & Recommendations:  Problem # 1:  BRONCHITIS-ACUTE (ICD-466.0) Assessment Deteriorated  The following medications were removed from the medication list:    Levaquin 500 Mg Tabs (Levofloxacin) ..... Once daily for 10 days Her updated medication list for this problem includes:    Proair Hfa 108 (90 Base) Mcg/act Aers (Albuterol sulfate) .Marland Kitchen... 2 inh q4h as needed shortness of breath    Singulair 10 Mg Tabs (Montelukast sodium) .Marland Kitchen... Take 1 by mouth qd    Duoneb 0.5-2.5 (3) Mg/38ml Soln (Ipratropium-albuterol) .Marland Kitchen... 1 via hhn qid prn    Dulera 100-5 Mcg/act Aero (Mometasone furo-formoterol fum) .Marland Kitchen... 2 puffs bid    Augmentin 875-125 Mg Tabs (Amoxicillin-pot clavulanate) .Marland Kitchen... 1 by mouth bid    Promethazine-codeine 6.25-10 Mg/20ml Syrp (Promethazine-codeine) .Marland Kitchen... 5-10 ml by mouth q id as needed cough  Orders: Depo- Medrol 40mg  (J1030) Depo- Medrol 80mg  (J1040) Admin of Therapeutic Inj  intramuscular or subcutaneous (16109)  Problem # 2:  COUGH (ICD-786.2) Assessment: Deteriorated As above  Problem # 3:  ASTHMA UNSPECIFIED (ICD-493.90) Assessment: Deteriorated  Her updated medication list for this problem includes:    Proair Hfa 108 (90 Base) Mcg/act Aers (Albuterol sulfate) .Marland Kitchen... 2 inh q4h as needed shortness of breath    Singulair 10 Mg Tabs (Montelukast sodium) .Marland Kitchen... Take  1 by mouth qd    Duoneb 0.5-2.5 (3) Mg/13ml Soln (Ipratropium-albuterol) .Marland Kitchen... 1 via hhn qid prn    Dulera 100-5 Mcg/act Aero (Mometasone furo-formoterol fum) .Marland Kitchen... 2 puffs bid    Prednisone 10 Mg Tabs (Prednisone) .Marland Kitchen... Take 40mg  qd for 3 days, then 20 mg qd for 3 days, then 10mg  qd for 6 days, then stop. take pc.  Problem # 4:  FIBROMYALGIA (ICD-729.1) Assessment: Unchanged  Her updated medication list for this problem includes:    Vicodin Hp 10-660 Mg Tabs (Hydrocodone-acetaminophen) .Marland Kitchen... 1 po qid as needed    Zanaflex 4 Mg Tabs (Tizanidine hcl) .Marland Kitchen... 1 by mouth once daily as needed three times a day  Problem # 5:  DEPRESSION (ICD-311) Assessment: Unchanged  Her updated medication list for this problem includes:    Valium 5 Mg Tabs (Diazepam) .Marland Kitchen... 1 po bid as needed    Cymbalta 60 Mg Cpep (Duloxetine hcl) .Marland Kitchen... 1 tablet by mouth once  daily    Vistaril 50 Mg Caps (Hydroxyzine pamoate) .Marland Kitchen... 1-2  by mouth at bedtime as needed insomnia  Complete Medication List: 1)  Metformin Hcl 500 Mg Tb24 (Metformin hcl) .Marland Kitchen.. 1 by mouth bid 2)  Vicodin Hp 10-660 Mg Tabs (Hydrocodone-acetaminophen) .Marland Kitchen.. 1 po qid as needed 3)  Valium 5 Mg Tabs (Diazepam) .Marland Kitchen.. 1 po bid as needed 4)  Zanaflex 4 Mg Tabs (Tizanidine hcl) .Marland Kitchen.. 1 by mouth once daily as needed three times a day 5)  Promethazine Hcl 25 Mg Tabs (Promethazine hcl) .Marland Kitchen.. 1 or 1/2  q 6h as needed nausea 6)  Furosemide 20 Mg Tabs (Furosemide) .Marland Kitchen.. 1 by mouth once daily prn 7)  Cymbalta 60 Mg Cpep (Duloxetine hcl) .Marland Kitchen.. 1 tablet by mouth once daily 8)  Vitamin D3 1000 Unit Tabs (Cholecalciferol) .... 2000 international units once daily 9)  Proair Hfa 108 (90 Base) Mcg/act Aers (Albuterol sulfate) .... 2 inh q4h as needed shortness of breath 10)  Welchol 625 Mg Tabs (Colesevelam hcl) .... 1/2 tablet by mouth once daily diarrhea 11)  Singulair 10 Mg Tabs (Montelukast sodium) .... Take 1 by mouth qd 12)  Premarin 0.9 Mg Tabs (Estrogens conjugated)  .Marland Kitchen.. 1 by mouth qd 13)  Vistaril 50 Mg Caps (Hydroxyzine pamoate) .Marland Kitchen.. 1-2  by mouth at bedtime as needed insomnia 14)  Salacyn 6 % Crea (Salicylic acid) .... Use bid 15)  Align Caps (Misc intestinal flora regulat) .... Once daily 16)  Aciphex 20 Mg Tbec (Rabeprazole sodium) .... Take 1 tablet by mouth once a day 17)  Onetouch Ultra Test Strp (Glucose blood) .... Use once to twice a day as directed 18)  Triamcinolone Acetonide 0.5 % Crea (Triamcinolone acetonide) .... Apply bid to affected area 19)  Levbid 0.375 Mg Xr12h-tab (Hyoscyamine sulfate) .... Take one by mouth every 12 hours as needed for abd. pain. 20)  Restasis 0.05 % Emul (Cyclosporine) .Marland Kitchen.. 1 drop each eye once daily 21)  Duoneb 0.5-2.5 (3) Mg/59ml Soln (Ipratropium-albuterol) .Marland Kitchen.. 1 via hhn qid prn 22)  Dulera 100-5 Mcg/act Aero (Mometasone furo-formoterol fum) .... 2 puffs bid 23)  Cortisone Shot  .... Q 3 months 24)  Augmentin 875-125 Mg Tabs (Amoxicillin-pot clavulanate) .Marland Kitchen.. 1 by mouth bid 25)  Prednisone 10 Mg Tabs (Prednisone) .... Take 40mg  qd for 3 days, then 20 mg qd for 3 days, then 10mg  qd for 6 days, then stop. take pc. 26)  Promethazine-codeine 6.25-10 Mg/59ml Syrp (Promethazine-codeine) .... 5-10 ml by mouth q id as needed cough  Patient Instructions: 1)  Call if you are not better in a reasonable amount of time or if worse. Go to ER if feeling really bad! 2)  Keep return office visit  Prescriptions: PROMETHAZINE-CODEINE 6.25-10 MG/5ML SYRP (PROMETHAZINE-CODEINE) 5-10 ml by mouth q id as needed cough  #300 ml x 0   Entered and Authorized by:   Tresa Garter MD   Signed by:   Tresa Garter MD on 10/07/2009   Method used:   Print then Give to Patient   RxID:   [id number] PREDNISONE 10 MG TABS (PREDNISONE) Take 40mg  qd for 3 days, then 20 mg qd for 3 days, then 10mg  qd for 6 days, then stop. Take pc.  #24 x 1   Entered and Authorized by:   Tresa Garter MD   Signed by:   Tresa Garter MD  on 10/07/2009   Method used:   [facility] to        [facility]  [address] [id number]       [address]       [place], [place]  [address]       Ph: [phone] or [phone]       Fax: [phone]   RxID:   [id number] AUGMENTIN 875-125 MG TABS (AMOXICILLIN-POT CLAVULANATE) 1 by mouth bid  #28 x 0   Entered and Authorized by:   Tresa Garter MD   Signed by:   Tresa Garter MD on 10/07/2009   Method used:   [facility]        [facility]  [address] [id number]       [address]       [place], [place]  [address]       Ph: [phone] or [phone]       Fax: [phone]   RxID:   [id number]    Medication Administration  Injection # 1:    Medication: Depo- Medrol 40mg     Diagnosis: BRONCHITIS-ACUTE (ICD-466.0)    Route: IM    Site: LUOQ gluteus    Exp Date: 06/2010    Lot #: [id number]    Mfr: teva    Patient tolerated injection without complications    Given by: Tora Perches (October 07, 2009 4:00 PM)  Injection # 2:    Medication: Depo- Medrol 80mg     Diagnosis: BRONCHITIS-ACUTE (ICD-466.0)    Route: IM    Site: LUOQ gluteus    Exp Date: 06/2010    Lot #: [id number]    Mfr: teva    Patient tolerated injection without complications    Given by: Tora Perches (October 07, 2009 4:00 PM)  Orders Added: 1)  Est. Patient Level IV [61607] 2)  Depo- Medrol 40mg  [J1030] 3)  Depo- Medrol 80mg  [J1040] 4)  Admin of Therapeutic Inj  intramuscular or subcutaneous [37106]

## 2010-10-25 NOTE — Progress Notes (Signed)
Summary: REQ FOR RX   Phone Note Call from Patient Call back at [facility] Phone [phone]   Summary of Call: Patient is requesting rx for premarin vaginal cream to help w/vaginal dryness.  Initial call taken by: Lamar Sprinkles, CMA,  July 25, 2010 11:55 AM  Follow-up for Phone Call        ok Follow-up by: Tresa Garter MD,  July 25, 2010 9:02 PM  Additional Follow-up for Phone Call Additional follow up Details #1::        Pt advised of Rx, states that she left several messages yesterday specifing that Rx needed to be sent to pharmacy 10/31 or she would have top pay $200+ for Rx. I apologized to pt for miscommunication. Additional Follow-up by: Margaret Pyle, CMA,  July 26, 2010 9:28 AM    New/Updated Medications: PREMARIN 0.625 MG/GM CREA (ESTROGENS, CONJUGATED) Use 1 g pv at bedtime x 3 d, then every other day x 6 d, then weekly Prescriptions: PREMARIN 0.625 MG/GM CREA (ESTROGENS, CONJUGATED) Use 1 g pv at bedtime x 3 d, then every other day x 6 d, then weekly  #45 g x 3   Entered and Authorized by:   Tresa Garter MD   Signed by:   Margaret Pyle, CMA on 07/26/2010   Method used:   [facility]        [facility]  [address] [id number]       [address]       [place], [place]  [address]       Ph: [phone] or [phone]       Fax: [phone]   RxID:   [id number]

## 2010-10-25 NOTE — Progress Notes (Signed)
Summary: TRIAGE-abd pain  Phone Note Call from Patient Call back at Home Phone [phone]   Caller: Patient Call For: Dr. Juanda Chance Reason for Call: Talk to Nurse Summary of Call: pt reporting pain in upper left side of abd... would like to speak to nurse about it Initial call taken by: Vallarie Mare,  November 15, 2009 11:48 AM  Follow-up for Phone Call        Pt. c/o epigastric pain for 3 days, getting worse. Stays nauseated. BRB on tissue after a BM, since yesterday, hemorrhoids inflamed. Loose stools for 2 days. Ran out of Achipex, taking Omeprazole. Has Vicodin and Phenergan. Has Anusol cream for hemorrhoids, cannot use the suppositories.   1) Full liquids x24 hours. Advance to soft,bland diet x2-4 days. Advanced as tolerated. 2) Increase Omeprazole to two times a day until you get back on Achipex. 3) Continue Phenergan for nausea 4) Vicodin as needed or pain 5 Anusol HC oint. PR as needed,sitz bath TID,Tucks pads to rectum TID,use baby wipes instead of toilet paper. 6) Immodium as needed for diarrhea. 7) Gas-x,Phazyme, etc. as needed for gas & bloating. 8) Levbid two times a day for 5 days, then as needed 9) If symptoms become worse call back immediately or go to ER. 10) I will call pt., if new orders, after MD reviews.     Follow-up by: Laureen Ochs LPN,  November 15, 2009 12:25 PM  Additional Follow-up for Phone Call Additional follow up Details #1::        I agree with the above.She has a hx of tight Nissen fundoplication, May need to doudle up on PPI's temporarily. Additional Follow-up by: Hart Carwin MD,  November 15, 2009 1:12 PM

## 2010-10-25 NOTE — Miscellaneous (Signed)
Summary: Orders Update pft charges  Clinical Lists Changes  Orders: Added new Service order of Carbon Monoxide diffusing w/capacity (94720) - Signed Added new Service order of Lung Volumes (94240) - Signed Added new Service order of Spirometry (Pre & Post) (94060) - Signed 

## 2010-10-25 NOTE — Progress Notes (Signed)
Summary: valium  Phone Note Refill Request Message from:  Fax from Pharmacy on Feb 22, 2010 11:33 AM  Refills Requested: Medication #1:  VALIUM 5 MG  TABS 1 po bid as needed # 60   Last Ref[facility]: [facility] [address] [phone] Last ov 02/16/10  Next Appointment Scheduled: 05/25/10 Initial call taken by: Orlan Leavens,  Feb 22, 2010 11:34 AM  Follow-up for Phone Call        Is this ok to refill? Follow-up by: Orlan Leavens,  Feb 22, 2010 11:36 AM  Additional Follow-up for Phone Call Additional follow up Details #1::        ok 3 ref Additional Follow-up by: Tresa Garter MD,  Feb 22, 2010 2:27 PM    Prescriptions: VALIUM 5 MG  TABS (DIAZEPAM) 1 po bid as needed  #60 x 3   Entered by:   Lamar Sprinkles, CMA   Authorized by:   Tresa Garter MD   Signed by:   Lamar Sprinkles, CMA on 02/22/2010   Method used:   Telephoned to ...       [facility]  [address] [id number]       [address]       [place], [place]  [address]       Ph: [phone] or [phone]       Fax: [phone]   RxID:   [id number]

## 2010-10-25 NOTE — Assessment & Plan Note (Signed)
Summary: copd/apc   Visit Type:  Initial Consult Copy to:  Dr. Romero Belling Primary Provider/Referring Provider:  Sonda Primes, MD  CC:  Pt here for pulmonary consult.  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  She was admitted 4/1-12/27/09 for a severe COPd exacerbation requiring Abx & steroids, destaurated to 87% on discharge but did not require home O2. She has since improved, quit smoking & did not desaturate on ambulating today. She is maintained on dulera, did not take spiriva after reading about side effects. Cough & wheezing have resolved, has gained 10  lbs with steroids & smoking cessation.  Husband has sleep apnea & has noted loud snoring, but not witnessed apneas. She reports frequent heartburn , s/p fundoplication '99 & esophageal dilation of UES by Dr Juanda Chance. IBS & fibromyalgia prevent her from exercisie.  Preventive Screening-Counseling & Management  Alcohol-Tobacco     Smoking Status: quit     Packs/Day: 0.5     Year Started: 1981     Year Quit: 2011  Current Medications (verified): 1)  Metformin Hcl 500 Mg  Tb24 (Metformin Hcl) .... Take 1 Tablet By Mouth Two Times A Day 2)  Vicodin Hp 10-660 Mg  Tabs (Hydrocodone-Acetaminophen) .Marland Kitchen.. 1 Po Qid As Needed 3)  Valium 5 Mg  Tabs (Diazepam) .Marland Kitchen.. 1 Po Bid As Needed 4)  Zanaflex 4 Mg  Tabs (Tizanidine Hcl) .... Take 1 Tablet By Mouth Three Times A Day 5)  Promethazine Hcl 25 Mg  Tabs (Promethazine Hcl) .Marland Kitchen.. 1 or 1/2  Q 6h As Needed Nausea 6)  Furosemide 20 Mg  Tabs (Furosemide) .Marland Kitchen.. 1 By Mouth Once Daily Prn 7)  Cymbalta 60 Mg Cpep (Duloxetine Hcl) .Marland Kitchen.. 1 Tablet By Mouth Once Daily 8)  Vitamin D3 1000 Unit  Tabs (Cholecalciferol) .... 2000 International Units Once Daily 9)  Proair Hfa 108 (90 Base) Mcg/act  Aers (Albuterol Sulfate) .... 2 Inh Q4h As Needed Shortness of Breath 10)  Zyrtec Hives Relief 10 Mg Tabs (Cetirizine Hcl) .... Take 1 Tablet By Mouth Once A Day 11)   Premarin 0.9 Mg  Tabs (Estrogens Conjugated) .Marland Kitchen.. 1 By Mouth Qd 12)  Vistaril 50 Mg  Caps (Hydroxyzine Pamoate) .... 2  By Mouth At Bedtime As Needed Insomnia 13)  Align  Caps (Misc Intestinal Flora Regulat) .... Once Daily 14)  Aciphex 20 Mg Tbec (Rabeprazole Sodium) .... Take 1 Tablet By Mouth Once A Day 15)  Onetouch Ultra Test  Strp (Glucose Blood) .... Use Once To Twice A Day As Directed 16)  Levbid 0.375 Mg Xr12h-Tab (Hyoscyamine Sulfate) .... Take One By Mouth Every 12 Hours As Needed For Abd. Pain. 17)  Restasis 0.05 % Emul (Cyclosporine) .Marland Kitchen.. 1 Drop Each Eye Once Daily 18)  Duoneb 0.5-2.5 (3) Mg/70ml Soln (Ipratropium-Albuterol) .Marland Kitchen.. 1 Via Hhn Qid Prn 19)  Dulera 100-5 Mcg/act Aero (Mometasone Furo-Formoterol Fum) .... 2 Puffs Bid 20)  Cortisone Shot .... Q 3 Months 21)  Promethazine-Codeine 6.25-10 Mg/65ml Syrp (Promethazine-Codeine) .... 5-10 Ml By Mouth Q Id As Needed Cough 22)  Triamcinolone Acetonide 0.1 % Crea (Triamcinolone Acetonide) .... Three Times A Day As Needed Rash  Allergies (verified): 1)  ! Avelox (Moxifloxacin Hcl) 2)  ! Topamax (Topiramate) 3)  ! Neomycin-Bacitracin Zn-Polymyx (Neomycin-Bacitracin Zn-Polymyx) 4)  ! Toradol 5)  ! * Dilaudid 6)  ! Lyrica (Pregabalin) 7)  ! * Ivp Dye 8)  Actos (Pioglitazone Hcl) 9)  Augmentin (Amoxicillin-Pot Clavulanate)  Past History:  Past Medical History: Last updated: 01/19/2009 Asthma COPD Depression GERD  Dr Juanda Chance Migraines Menopause IBS FMS Possible MRSA-  skin (by verbal report from pt) Diabetes mellitus, type II Peripheral neuropathy Gastroparesis  Past Surgical History: Last updated: 02/02/2009 Hysterectomy  complete Sinus surgery  x 2 abd u/s colonoscopy  1994 Nissen Fundoplication  1999 Surgery for Left heel spur Appendectomy VENTRAL HERNIA REPAIR WITH MESH Cholecystectomy  Family History: Last updated: 04/16/2008 Family History of Arthritis Family History Diabetes 1st degree relative Mother  and Father MGrandparents Family History Hypertension B MI 65 Family History of Heart Disease: Maternal Grandmother brother died [place] Family History of Breast Cancer: Maternal Grandmother Family History of Colon Cancer:MGF PGF Family History of Colon Polyps:father PGF and MGF Lung Cancer-mother died age 35 Family History of Uterine Cancer:Great grandmother Family History of Esophageal Cancer:father  Social History: Last updated: 01/25/2010 Occupation: CMA disabled Married, lives with husband and grandchild Children: yes 3 Regular exercise-no Alcohol Use - no Daily Caffeine Use Patient states former smoker. (stopped April 2011)  Social History: Occupation: CMA disabled Married, lives with husband and grandchild Children: yes 3 Regular exercise-no Alcohol Use - no Daily Caffeine Use Patient states former smoker. (stopped April 2011) Smoking Status:  quit Packs/Day:  0.5  Review of Systems       The patient complains of shortness of breath with activity, shortness of breath at rest, productive cough, non-productive cough, chest pain, acid heartburn, indigestion, loss of appetite, weight change, abdominal pain, difficulty swallowing, sore throat, headaches, nasal congestion/difficulty breathing through nose, sneezing, itching, anxiety, depression, hand/feet swelling, joint stiffness or pain, and change in color of mucus.  The patient denies coughing up blood, irregular heartbeats, tooth/dental problems, ear ache, rash, and fever.    Vital Signs:  Patient profile:   55 year old female Height:      65 inches Weight:      249.13 pounds O2 Sat:      93 % on Room air Temp:     98.5 degrees F oral Pulse rate:   80 / minute BP sitting:   126 / 82  (left arm)  Vitals Entered By: Zackery Barefoot CMA (Jan 25, 2010 10:59 AM)  O2 Flow:  Room air  Serial Vital Signs/Assessments:  Comments: 11:55 AM Ambulatory Pulse Oximetry  Resting; HR__82___    02 Sat__95% on room  air___  Lap1 (185 feet)   HR__98___   02 Sat__94% on room air___ Lap2 (185 feet)   HR__109___   02 Sat_94% on room air____    Lap3 (185 feet)   HR_____   02 Sat__94% on room air___ 109 _x__Test Completed without Difficulty ___Test Stopped due to:  By: Michel Bickers CMA   CC: Pt here for pulmonary consult Comments Medications reviewed with patient Verified contact number and pharmacy with patient  Zackery Barefoot CMA  Jan 25, 2010 11:00 AM    Physical Exam  Additional Exam:  Gen. Pleasant, obese, in no distress, normal affect ENT - no lesions, no post nasal drip, class 2 airway Neck: No JVD, no thyromegaly, no carotid bruits Lungs: no use of accessory muscles, no dullness to percussion, decreased  without rales or rhonchi  Cardiovascular: Rhythm regular, heart sounds  normal, no murmurs or gallops, no peripheral edema Abdomen: soft and non-tender, no hepatosplenomegaly, BS normal. Musculoskeletal: No deformities, no cyanosis or clubbing Neuro:  alert, non focal     Impression & Recommendations:  Problem # 1:  COPD (ICD-496) Stay on dulera for  now  Obtain spirometry, if lung function low , consider adding spiriva. Pulm rehab referral -does not need O2 Sleep evaluation in the future has quit smoking - most effective intervention , ct nicotine patches , she will call if needs chantix.  Problem # 2:  GERD (ICD-530.81) Assessment: Comment Only wonder if this is contributing to episodic worsening Her updated medication list for this problem includes:    Aciphex 20 Mg Tbec (Rabeprazole sodium) .Marland Kitchen... Take 1 tablet by mouth once a day    Levbid 0.375 Mg Xr12h-tab (Hyoscyamine sulfate) .Marland Kitchen... Take one by mouth every 12 hours as needed for abd. pain.  Orders: Consultation Level III (16109)  Medications Added to Medication List This Visit: 1)  Metformin Hcl 500 Mg Tb24 (Metformin hcl) .... Take 1 tablet by mouth two times a day 2)  Zyrtec Hives Relief 10 Mg Tabs (Cetirizine hcl)  .... Take 1 tablet by mouth once a day  Other Orders: Pulmonary Referral (Pulmonary) Rehabilitation Referral (Rehab)  Patient Instructions: 1)  Copy sent to: Dr Posey Rea 2)  Please schedule a follow-up appointment in 1 month. 3)  Stay on dulera for now 4)  You have done well by quitting- let us know if you need any help with this. 5)  You have been asked to make an appointment for a Pulmonary Function Test (Breathing Test) prior to or at the time of your next visit. Use medications as usual unless otherwise instructed.

## 2010-10-25 NOTE — Assessment & Plan Note (Signed)
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 Juanda Chance. 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 SHE IS S/P REMOTE NISSEN  WITH A TIGHT WRAP AND IS FELT TO HAVE A GAS/BLOAT SYDROME, AND HAS ALSO HAD RX FOR BACTERIAL OVERGROWTH IN THE PAST. SHE COMES IN TODAY  FEELING POORLY OVER THE PAST  COUPLE MONTHS WITH FREQUENT NAUSEA, DRY HEAVES AND EPISODES OF EPIGASTRIC PAIN, DISTENTION WHICH LAST FOR A FEW HOURS AND ARE "MISERABLE".  SHE HAS HAD ONGOING EARLY SATIETY, POST PRANDIAL  BLOATING, AND INTERMITTENT DIARRHEA.BENTYL HAS NOT BEEN HELPFUL, SHE JUST STARTED ON LEVSIN-MAY HELP SOME.  SHE TRIES TO WATCH HER DIET,IS EATING FREQUENT SMALL MEALS, AVOIDS GASSY FOODS ETC. GASTRIC EMPTYING SCAN WAS TO BE DONE EARLIER THIS YEAR BUT INSURANCE WOULD NOT COVER.   GI Review of Systems    Reports abdominal pain, bloating, and  nausea.     Location of  Abdominal pain: LUQ.    Denies acid reflux, belching, chest pain, dysphagia with liquids, dysphagia with solids, heartburn, loss of appetite, vomiting, vomiting blood, weight loss, and  weight gain.      Reports anal fissur.     Denies black tarry stools, change in bowel habit, constipation, diarrhea, diverticulosis, fecal incontinence, heme positive stool, hemorrhoids, irritable bowel syndrome, jaundice, light color stool, liver problems, rectal bleeding, and  rectal pain.    Current Medications (verified): 1)  Metformin Hcl 500 Mg  Tb24 (Metformin Hcl) .... Take 1 Tablet By Mouth Two Times A Day 2)  Vicodin Hp 10-660 Mg  Tabs (Hydrocodone-Acetaminophen) .... Take 1 Tablet By Mouth Once A Day and As Needed 3)  Valium 5 Mg  Tabs (Diazepam) .... Take 2  Tab By Mouth At Bedtime and As Needed 4)  Zanaflex 4 Mg  Tabs (Tizanidine Hcl) .... Take 1 Tablet By Mouth Three Times A Day 5)  Promethazine Hcl 25 Mg  Tabs (Promethazine Hcl) .Marland Kitchen.. 1 or 1/2  Q 6h As Needed Nausea 6)  Furosemide 20 Mg  Tabs (Furosemide) .Marland Kitchen.. 1 By Mouth Once Daily Prn 7)  Vitamin D3 1000 Unit  Tabs (Cholecalciferol) .... 2000 International Units Once Daily 8)  Proair Hfa 108 (90 Base) Mcg/act  Aers (Albuterol Sulfate) .... 2 Inh Q4h As Needed Shortness of Breath 9)  Zyrtec Hives Relief 10 Mg Tabs (Cetirizine Hcl) .... Take 1 Tablet By Mouth Once A Day 10)  Premarin 0.9 Mg  Tabs (Estrogens Conjugated) .Marland Kitchen.. 1 By Mouth Qd 11)  Vistaril 50 Mg  Caps (Hydroxyzine Pamoate) .... 2  By Mouth At Bedtime As Needed Insomnia 12)  Align  Caps (Misc Intestinal Flora Regulat) .... Once Daily 13)  Aciphex 20 Mg Tbec (Rabeprazole Sodium) .... Take 1 Tablet By Mouth Once A Day 14)  Onetouch Ultra Test  Strp (Glucose Blood) .... Use Once To Twice A Day As Directed 15)  Restasis 0.05 % Emul (Cyclosporine) .Marland Kitchen.. 1 Drop Each Eye Once Daily 16)  Duoneb 0.5-2.5 (3) Mg/54ml Soln (Ipratropium-Albuterol) .Marland Kitchen.. 1 Via Hhn Qid Prn 17)  Cortisone Shot .... Q 3 Months 18)  Promethazine-Codeine 6.25-10 Mg/40ml Syrp (Promethazine-Codeine) .... 5-10 Ml By Mouth Q Id As Needed Cough 19)  Triamcinolone Acetonide 0.1 % Crea (Triamcinolone Acetonide) .... Three Times A Day As Needed Rash  20)  Pristiq 50 Mg Xr24h-Tab (Desvenlafaxine Succinate) .Marland Kitchen.. 1 By Mouth Qd 21)  Levsin/sl 0.125 Mg Subl (Hyoscyamine Sulfate) .Marland Kitchen.. 1 Sl Qid  Prn 22)  Premarin 0.625 Mg/gm Crea (Estrogens, Conjugated) .... Use 1 G Pv At Bedtime X 3 D, Then Every Other Day X 6 D, Then Weekly  Allergies (verified): 1)  ! Avelox (Moxifloxacin Hcl) 2)  ! Topamax (Topiramate) 3)  ! Neomycin-Bacitracin Zn-Polymyx (Neomycin-Bacitracin Zn-Polymyx) 4)  ! Toradol 5)  ! * Dilaudid 6)  ! Lyrica (Pregabalin) 7)  ! * Ivp Dye 8)  Actos (Pioglitazone Hcl) 9)   Augmentin (Amoxicillin-Pot Clavulanate) 10)  Cymbalta 11)  Seroquel (Quetiapine Fumarate)  Past History:  Past Medical History: Asthma COPD Depression GERD  Dr Juanda Chance Migraines Menopause IBS  Possible MRSA-  skin (by verbal report from pt) Diabetes mellitus, type II Peripheral neuropathy Gastroparesis Fibromyalgia  Past Surgical History: Hysterectomy  complete Sinus surgery  x 2 abd u/s colonoscopy 2003 Nissen Fundoplication  1999 Surgery for Left heel spur Appendectomy VENTRAL HERNIA REPAIR WITH MESH Cholecystectomy  Family History: Reviewed history from 04/16/2008 and no changes required. Family History of Arthritis Family History Diabetes 1st degree relative Mother and Father MGrandparents Family History Hypertension B MI 58 Family History of Heart Disease: Maternal Grandmother brother died [place] Family History of Breast Cancer: Maternal Grandmother Family History of Colon Cancer:MGF PGF Family History of Colon Polyps:father PGF and MGF Lung Cancer-mother died age 91 Family History of Uterine Cancer:Great grandmother Family History of Esophageal Cancer:father  Social History: Reviewed history from 05/25/2010 and no changes required. Occupation: CMA disabled Married, lives with husband and grandchild Children: yes 3 Regular exercise-no Alcohol Use - no Daily Caffeine Use Patient states former smoker. (stopped April 2011) Former Smoker 4/11  Review of Systems       The patient complains of fatigue and shortness of breath.  The patient denies allergy/sinus, anemia, anxiety-new, arthritis/joint pain, back pain, blood in urine, breast changes/lumps, change in vision, confusion, cough, coughing up blood, depression-new, fainting, fever, headaches-new, hearing problems, heart murmur, heart rhythm changes, itching, menstrual pain, muscle pains/cramps, night sweats, nosebleeds, pregnancy symptoms, skin rash, sleeping problems, sore throat, swelling of feet/legs,  swollen lymph glands, thirst - excessive , urination - excessive , urination changes/pain, urine leakage, vision changes, and voice change.         SEE HPI  Vital Signs:  Patient profile:   55 year old female Height:      65 inches Weight:      253 pounds BMI:     42.25 BSA:     2.19 Pulse rate:   68 / minute Pulse rhythm:   regular BP sitting:   142 / 84  (left arm)  Vitals Entered By: Merri Ray CMA Duncan Dull) (July 28, 2010 9:49 AM)  Physical Exam  General:  Well developed, well nourished, no acute distress.obese.   Head:  Normocephalic and atraumatic. Eyes:  PERRLA, no icterus. Lungs:  Clear throughout to auscultation. Heart:  Regular rate and rhythm; no murmurs, rubs,  or bruits. Abdomen:  LARGE,SOFT, NO FOCAL TENDERNESS, NO MASS OR HSM,BS+ Rectal:  NOT DONE Extremities:  No clubbing, cyanosis, edema or deformities noted. Neurologic:  Alert and  oriented x4;  grossly normal neurologically. Psych:  Alert and cooperative. Normal mood and affect.   Impression & Recommendations:  Problem # 1:  ABDOMINAL PAIN-EPIGASTRIC (ICD-789.06) Assessment Deteriorated 55 YO FEMALE WITH CHRONIC GERD, IBS,PROBABLE GASTROPARESIS WITH REMOTE NISSEN AND GAS BLOAT SYNDROME. PT WITH INCREASED  SXS OF NAUSEA, DRY HEAVES, EARLY SATIETY,POST PRANDIAL BLOATING, AND INTERMITTENT EPIGASTRIC PAIN AND ABDOMINAL DISTENTION. SUSPECT SXS SECONDARY TO GAS BLOAT- R/O GASTROPARESIS,R/O COMPONENT OF BACTERIAL OVERGROWTH.  SCHEDULE GASTRIC  EMPTYING SCAN LOW GAS,GASTROPARESIS DIET TRIAL OF XIFAXAN 550 TWICE DAILY X 10 DAYS SHE MAY BENEFIT FROM REGLAN, DISCUSSED POTENTIAL SIDE EFFECTS TODAY. SHE HAS TAKEN PREVIOUSLY WITHOUT DIFFICULTY-WILL WAIT FOR GE SCAN CONTINUE ACIPHEX 20 MG DAILY-MAY DOUBLE UP WITH ACUTE EPISODES.  Problem # 2:  OBESITY (ICD-278.00) Assessment: Comment Only  Problem # 3:  FIBROMYALGIA (ICD-729.1) Assessment: Comment Only  Problem # 4:  DIABETES MELLITUS, TYPE II  (ICD-250.00) Assessment: Comment Only  Problem # 5:  COPD (ICD-496) Assessment: Comment Only  Other Orders: Gastric Emptying Scan (GES)  Patient Instructions: 1)  We scheduled the Gastric Emptying Scan on 08-16-10. 2)  Directions and brochure provided. 3)  We will call you with the results. 4)  Copy sent to :Sonda Primes, MD  5)  The medication list was reviewed and reconciled.  All changed / newly prescribed medications were explained.  A complete medication list was provided to the patient / caregiver. Prescriptions: XIFAXAN 550 MG TABS (RIFAXIMIN) Take 1 tab twice daily x 10 days  #20 x 0   Entered by:   Lowry Ram NCMA   Authorized by:   Sammuel Cooper PA-c   Signed by:   Lowry Ram NCMA on 07/28/2010   Method used:   Electronically to        [facility]  [address] [id number] (retail)       [address]       [place], [place]  [address]       Ph: [phone] or [phone]       Fax: [phone]   RxID:   [id number]

## 2010-10-25 NOTE — Assessment & Plan Note (Signed)
Summary: pain left side to back/offered sda/#/cd   Vital Signs:  Patient profile:   55 year old female Weight:      243 pounds O2 Sat:      95 % Temp:     98.3 degrees F oral Pulse rate:   107 / minute BP sitting:   96 / 74  (left arm)  Vitals Entered By: Tora Perches (November 22, 2009 4:07 PM) CC: left side pain going towards back   Is Patient Diabetic? Yes   Primary Care Provider:  Sonda Primes, MD  CC:  left side pain going towards back  .  History of Present Illness: C/o pain in the L groin and around the R back now 5-6/10 constant since last Wed. Pain in R groin has resolved.  Preventive Screening-Counseling & Management  Alcohol-Tobacco     Smoking Status: current  Current Medications (verified): 1)  Metformin Hcl 500 Mg  Tb24 (Metformin Hcl) .Marland Kitchen.. 1 By Mouth Bid 2)  Vicodin Hp 10-660 Mg  Tabs (Hydrocodone-Acetaminophen) .Marland Kitchen.. 1 Po Qid As Needed 3)  Valium 5 Mg  Tabs (Diazepam) .Marland Kitchen.. 1 Po Bid As Needed 4)  Zanaflex 4 Mg  Tabs (Tizanidine Hcl) .... Take 1 Tablet By Mouth Three Times A Day 5)  Promethazine Hcl 25 Mg  Tabs (Promethazine Hcl) .Marland Kitchen.. 1 or 1/2  Q 6h As Needed Nausea 6)  Furosemide 20 Mg  Tabs (Furosemide) .Marland Kitchen.. 1 By Mouth Once Daily Prn 7)  Cymbalta 60 Mg Cpep (Duloxetine Hcl) .Marland Kitchen.. 1 Tablet By Mouth Once Daily 8)  Vitamin D3 1000 Unit  Tabs (Cholecalciferol) .... 2000 International Units Once Daily 9)  Proair Hfa 108 (90 Base) Mcg/act  Aers (Albuterol Sulfate) .... 2 Inh Q4h As Needed Shortness of Breath 10)  Welchol 625 Mg  Tabs (Colesevelam Hcl) .... 1/2 Tablet By Mouth Once Daily Diarrhea 11)  Singulair 10 Mg  Tabs (Montelukast Sodium) .... Take 1 By Mouth Qd 12)  Premarin 0.9 Mg  Tabs (Estrogens Conjugated) .Marland Kitchen.. 1 By Mouth Qd 13)  Vistaril 50 Mg  Caps (Hydroxyzine Pamoate) .Marland Kitchen.. 1-2  By Mouth At Bedtime As Needed Insomnia 14)  Align  Caps (Misc Intestinal Flora Regulat) .... Once Daily 15)  Aciphex 20 Mg Tbec (Rabeprazole Sodium) .... Take 1 Tablet By  Mouth Once A Day 16)  Onetouch Ultra Test  Strp (Glucose Blood) .... Use Once To Twice A Day As Directed 17)  Levbid 0.375 Mg Xr12h-Tab (Hyoscyamine Sulfate) .... Take One By Mouth Every 12 Hours As Needed For Abd. Pain. 18)  Restasis 0.05 % Emul (Cyclosporine) .Marland Kitchen.. 1 Drop Each Eye Once Daily 19)  Duoneb 0.5-2.5 (3) Mg/70ml Soln (Ipratropium-Albuterol) .Marland Kitchen.. 1 Via Hhn Qid Prn 20)  Dulera 100-5 Mcg/act Aero (Mometasone Furo-Formoterol Fum) .... 2 Puffs Bid 21)  Cortisone Shot .... Q 3 Months 22)  Promethazine-Codeine 6.25-10 Mg/10ml Syrp (Promethazine-Codeine) .... 5-10 Ml By Mouth Q Id As Needed Cough 23)  Ketoconazole 2 % Crea (Ketoconazole) .... Use Bid  Allergies: 1)  ! Avelox (Moxifloxacin Hcl) 2)  ! Topamax (Topiramate) 3)  ! Neomycin-Bacitracin Zn-Polymyx (Neomycin-Bacitracin Zn-Polymyx) 4)  ! Toradol 5)  ! * Dilaudid 6)  ! Lyrica (Pregabalin) 7)  Actos (Pioglitazone Hcl) 8)  Augmentin (Amoxicillin-Pot Clavulanate)  Past History:  Past Medical History: Last updated: 01/19/2009 Asthma COPD Depression GERD  Dr Juanda Chance Migraines Menopause IBS FMS Possible MRSA-  skin (by verbal report from pt) Diabetes mellitus, type II Peripheral neuropathy Gastroparesis  Past Surgical History: Last updated: 02/02/2009  Hysterectomy  complete Sinus surgery  x 2 abd u/s colonoscopy  1994 Nissen Fundoplication  1999 Surgery for Left heel spur Appendectomy VENTRAL HERNIA REPAIR WITH MESH Cholecystectomy  Social History: Last updated: 04/16/2008 Occupation: RN disabled Married Current Smoker Regular exercise-no Alcohol Use - no Daily Caffeine Use  Review of Systems       The patient complains of fever and abdominal pain.  The patient denies dyspnea on exertion, prolonged cough, and severe indigestion/heartburn.    Physical Exam  General:  NAD, pale, sweaty Nose:  External nasal examination shows no deformity or inflammation. Nasal mucosa are pink and moist without lesions  or exudates. Mouth:  Erythematous throat mucosa and intranasal erythema.  Lungs:  No wheezes Heart:  RRR Abdomen:  R groin is a little tender. no masses, no rebound tenderness, no inguinal hernia, no hepatomegaly, and no splenomegaly.   Msk:  Lumbar-sacral spine is tender to palpation over paraspinal muscles and painfull with the ROM  Extremities:  No clubbing, cyanosis, edema, or deformity noted with normal full range of motion of all joints.   Neurologic:  No cranial nerve deficits noted. Station and gait are normal. Plantar reflexes are down-going bilaterally. DTRs are symmetrical throughout. Sensory, motor and coordinative functions appear intact. Skin:  Intact without suspicious lesions or rashes Psych:  Cognition and judgment appear intact. Alert and cooperative with normal attention span and concentration. No apparent delusions, illusions, hallucinationsgood eye contact, not suicidal, subdued, and tearful.     Impression & Recommendations:  Problem # 1:  ABDOMINAL TENDERNESS, LEFT LOWER QUADRANT (UEA-540.98) Assessment Unchanged  Orders: Radiology Referral (Radiology) CT Demerol  100mg   Injection (J1914) Promethazine up to 50mg  (J2550) Admin of Therapeutic Inj  intramuscular or subcutaneous (78295) TLB-BMP (Basic Metabolic Panel-BMET) (80048-METABOL) TLB-CBC Platelet - w/Differential (85025-CBCD) TLB-Sedimentation Rate (ESR) (85652-ESR) TLB-Udip ONLY (81003-UDIP)  Complete Medication List: 1)  Metformin Hcl 500 Mg Tb24 (Metformin hcl) .Marland Kitchen.. 1 by mouth bid 2)  Vicodin Hp 10-660 Mg Tabs (Hydrocodone-acetaminophen) .Marland Kitchen.. 1 po qid as needed 3)  Valium 5 Mg Tabs (Diazepam) .Marland Kitchen.. 1 po bid as needed 4)  Zanaflex 4 Mg Tabs (Tizanidine hcl) .... Take 1 tablet by mouth three times a day 5)  Promethazine Hcl 25 Mg Tabs (Promethazine hcl) .Marland Kitchen.. 1 or 1/2  q 6h as needed nausea 6)  Furosemide 20 Mg Tabs (Furosemide) .Marland Kitchen.. 1 by mouth once daily prn 7)  Cymbalta 60 Mg Cpep (Duloxetine hcl) .Marland Kitchen.. 1  tablet by mouth once daily 8)  Vitamin D3 1000 Unit Tabs (Cholecalciferol) .... 2000 international units once daily 9)  Proair Hfa 108 (90 Base) Mcg/act Aers (Albuterol sulfate) .... 2 inh q4h as needed shortness of breath 10)  Welchol 625 Mg Tabs (Colesevelam hcl) .... 1/2 tablet by mouth once daily diarrhea 11)  Singulair 10 Mg Tabs (Montelukast sodium) .... Take 1 by mouth qd 12)  Premarin 0.9 Mg Tabs (Estrogens conjugated) .Marland Kitchen.. 1 by mouth qd 13)  Vistaril 50 Mg Caps (Hydroxyzine pamoate) .Marland Kitchen.. 1-2  by mouth at bedtime as needed insomnia 14)  Align Caps (Misc intestinal flora regulat) .... Once daily 15)  Aciphex 20 Mg Tbec (Rabeprazole sodium) .... Take 1 tablet by mouth once a day 16)  Onetouch Ultra Test Strp (Glucose blood) .... Use once to twice a day as directed 17)  Levbid 0.375 Mg Xr12h-tab (Hyoscyamine sulfate) .... Take one by mouth every 12 hours as needed for abd. pain. 18)  Restasis 0.05 % Emul (Cyclosporine) .Marland Kitchen.. 1 drop each eye  once daily 19)  Duoneb 0.5-2.5 (3) Mg/30ml Soln (Ipratropium-albuterol) .Marland Kitchen.. 1 via hhn qid prn 20)  Dulera 100-5 Mcg/act Aero (Mometasone furo-formoterol fum) .... 2 puffs bid 21)  Cortisone Shot  .... Q 3 months 22)  Promethazine-codeine 6.25-10 Mg/26ml Syrp (Promethazine-codeine) .... 5-10 ml by mouth q id as needed cough 23)  Ketoconazole 2 % Crea (Ketoconazole) .... Use bid 24)  Levaquin 500 Mg Tabs (Levofloxacin) .Marland Kitchen.. 1 by mouth qd 25)  Meperidine Hcl 50 Mg Tabs (Meperidine hcl) .Marland Kitchen.. 1 by mouth three times a day as needed severe pain  Patient Instructions: 1)  Call if you are not better in a reasonable amount of time or if worse. Go to ER if feeling really bad! 2)  Please schedule a follow-up appointment in 2 d. Prescriptions: MEPERIDINE HCL 50 MG TABS (MEPERIDINE HCL) 1 by mouth three times a day as needed severe pain  #12 x 0   Entered and Authorized by:   Tresa Garter MD   Signed by:   Tresa Garter MD on 11/22/2009   Method used:    Print then Give to Patient   RxID:   [id number] LEVAQUIN 500 MG TABS (LEVOFLOXACIN) 1 by mouth qd  #10 x 0   Entered and Authorized by:   Tresa Garter MD   Signed by:   Tresa Garter MD on 11/22/2009   Method used:   [facility]  [address] [id number]       [address]       [place], [place]  [address]       Ph: [phone] or [phone]       Fax: [phone]   RxID:   [id number]    Medication Administration  Injection # 1:    Medication: Demerol  100mg   Injection    Diagnosis: ABDOMINAL TENDERNESS, LEFT LOWER QUADRANT [id number])    Route: IM    Site: RUOQ gluteus    Exp Date: 07/27/2011    Lot #: [id number]    Mfr: [email or web address]    Comments: 50mg  given    Patient tolerated injection without complications    Given by: Tora Perches (November 22, 2009 4:53 PM)  Injection # 2:    Medication: Promethazine up to 50mg     Diagnosis: ABDOMINAL TENDERNESS, LEFT LOWER QUADRANT (ICD-789.64)    Route: IM    Site: RUOQ gluteus    Exp Date: 12/2009    Lot #: [id number]    Mfr: [email or web address]    Patient tolerated injection without complications    Given by: Tora Perches (November 22, 2009 4:54 PM)  Orders Added: 1)  Radiology Referral [Radiology] 2)  Demerol  100mg   Injection [J2175] 3)  Promethazine up to 50mg  [J2550] 4)  Admin of Therapeutic Inj  intramuscular or subcutaneous [96372] 5)  TLB-BMP (Basic Metabolic Panel-BMET) [80048-METABOL] 6)  TLB-CBC Platelet - w/Differential [85025-CBCD] 7)  TLB-Sedimentation Rate (ESR) [85652-ESR] 8)  TLB-Udip ONLY [81003-UDIP] 9)  Est. Patient Level IV [01601]

## 2010-10-25 NOTE — Progress Notes (Signed)
Summary: Pain/results  Phone Note Call from Patient Call back at [facility] Phone [phone]   Summary of Call: Patient called stating that the Vicodin is not helping, her pain has increased and radiates into her back. Patient ? if she has a bladder infection and requesting test results. Please advise. Initial call taken by: Lucious Groves,  November 17, 2009 2:09 PM  Follow-up for Phone Call        Patient called again for results, please advise. Follow-up by: Lucious Groves,  November 17, 2009 4:20 PM  Additional Follow-up for Phone Call Additional follow up Details #1::        No UTI, labs OK, except for high WBC 17 k(higher than before, was 12 k) To ER if pain is real bad, may need to be admitted for abd pain! Try ice, Advil. If better - please report tomorrow Additional Follow-up by: Tresa Garter MD,  November 17, 2009 5:49 PM    Additional Follow-up for Phone Call Additional follow up Details #2::    Pt informed  Follow-up by: Lamar Sprinkles, CMA,  November 18, 2009 2:57 PM  New/Updated Medications: ZANAFLEX 4 MG  TABS (TIZANIDINE HCL) Take 1 tablet by mouth three times a day

## 2010-10-25 NOTE — Assessment & Plan Note (Signed)
Summary: DECLINED ANOTHER MD THIS WK--ANXIETY--SAYS SHE'LL SEE DR GUTT...   Vital Signs:  Patient profile:   55 year old female Height:      65 inches Weight:      253 pounds BMI:     42.25 Temp:     97.8 degrees F oral Pulse rate:   72 / minute Pulse rhythm:   regular Resp:     16 per minute BP sitting:   110 / 78  (left arm) Cuff size:   large  Vitals Entered By: Lanier Prude, Beverly Gust) (July 20, 2010 4:32 PM) CC: discuss alt to Cymbalta Is Patient Diabetic? Yes Comments pt is not taking Cymbalta because she states it caused GI upset   Primary Care Provider:  Sonda Primes, MD  CC:  discuss alt to Cymbalta.  History of Present Illness: C/o depression is worse, insomnia F/u DM, HTN She weaned herself off Cymbalta due to diarrhea  Current Medications (verified): 1)  Metformin Hcl 500 Mg  Tb24 (Metformin Hcl) .... Take 1 Tablet By Mouth Two Times A Day 2)  Vicodin Hp 10-660 Mg  Tabs (Hydrocodone-Acetaminophen) .... Take 1 Tablet By Mouth Once A Day and As Needed 3)  Valium 5 Mg  Tabs (Diazepam) .... Take 2 Tab By Mouth At Bedtime and As Needed 4)  Zanaflex 4 Mg  Tabs (Tizanidine Hcl) .... Take 1 Tablet By Mouth Three Times A Day 5)  Promethazine Hcl 25 Mg  Tabs (Promethazine Hcl) .Marland Kitchen.. 1 or 1/2  Q 6h As Needed Nausea 6)  Furosemide 20 Mg  Tabs (Furosemide) .Marland Kitchen.. 1 By Mouth Once Daily Prn 7)  Cymbalta 60 Mg Cpep (Duloxetine Hcl) .Marland Kitchen.. 1 Tablet By Mouth Once Daily 8)  Vitamin D3 1000 Unit  Tabs (Cholecalciferol) .... 2000 International Units Once Daily 9)  Proair Hfa 108 (90 Base) Mcg/act  Aers (Albuterol Sulfate) .... 2 Inh Q4h As Needed Shortness of Breath 10)  Zyrtec Hives Relief 10 Mg Tabs (Cetirizine Hcl) .... Take 1 Tablet By Mouth Once A Day 11)  Premarin 0.9 Mg  Tabs (Estrogens Conjugated) .Marland Kitchen.. 1 By Mouth Qd 12)  Vistaril 50 Mg  Caps (Hydroxyzine Pamoate) .... 2  By Mouth At Bedtime As Needed Insomnia 13)  Align  Caps (Misc Intestinal Flora Regulat) .... Once  Daily 14)  Aciphex 20 Mg Tbec (Rabeprazole Sodium) .... Take 1 Tablet By Mouth Once A Day 15)  Onetouch Ultra Test  Strp (Glucose Blood) .... Use Once To Twice A Day As Directed 16)  Levbid 0.375 Mg Xr12h-Tab (Hyoscyamine Sulfate) .... Take One By Mouth Every 12 Hours As Needed For Abd. Pain. 17)  Restasis 0.05 % Emul (Cyclosporine) .Marland Kitchen.. 1 Drop Each Eye Once Daily 18)  Duoneb 0.5-2.5 (3) Mg/79ml Soln (Ipratropium-Albuterol) .Marland Kitchen.. 1 Via Hhn Qid Prn 19)  Cortisone Shot .... Q 3 Months 20)  Promethazine-Codeine 6.25-10 Mg/81ml Syrp (Promethazine-Codeine) .... 5-10 Ml By Mouth Q Id As Needed Cough 21)  Triamcinolone Acetonide 0.1 % Crea (Triamcinolone Acetonide) .... Three Times A Day As Needed Rash  Allergies (verified): 1)  ! Avelox (Moxifloxacin Hcl) 2)  ! Topamax (Topiramate) 3)  ! Neomycin-Bacitracin Zn-Polymyx (Neomycin-Bacitracin Zn-Polymyx) 4)  ! Toradol 5)  ! * Dilaudid 6)  ! Lyrica (Pregabalin) 7)  ! * Ivp Dye 8)  Actos (Pioglitazone Hcl) 9)  Augmentin (Amoxicillin-Pot Clavulanate) 10)  Cymbalta 11)  Seroquel (Quetiapine Fumarate)  Past History:  Past Medical History: Last updated: 01/19/2009 Asthma COPD Depression GERD  Dr Juanda Chance Migraines Menopause IBS FMS  Possible MRSA-  skin (by verbal report from pt) Diabetes mellitus, type II Peripheral neuropathy Gastroparesis  Social History: Last updated: 05/25/2010 Occupation: CMA disabled Married, lives with husband and grandchild Children: yes 3 Regular exercise-no Alcohol Use - no Daily Caffeine Use Patient states former smoker. (stopped April 2011) Former Smoker 4/11  Review of Systems       The patient complains of weight gain.  The patient denies fever, chest pain, syncope, and prolonged cough.    Physical Exam  General:  NAD, not  pale,  less sweaty Nose:  External nasal examination shows no deformity or inflammation. Nasal mucosa are pink and moist without lesions or exudates. Mouth:  No erythematous  throat mucosa and intranasal erythema.  Lungs:  No wheezes Heart:  RRR Abdomen:  R groin is a little tender; no masses, no rebound tenderness, no inguinal hernia, no hepatomegaly, and no splenomegaly.   Msk:  Lumbar-sacral spine is tender to palpation over paraspinal muscles and painfull with the ROM   Neurologic:  No cranial nerve deficits noted. Station and gait are normal. Plantar reflexes are down-going bilaterally. DTRs are symmetrical throughout. Sensory, motor and coordinative functions appear intact. Skin:  turgor normal, color normal,  no ecchymoses, no petechiae, no purpura, no ulcerations, and no edema.  Intertrigo present Psych:  Oriented X3, not suicidal, and subdued.  depressed affect, labile affect, and tearful.     Impression & Recommendations:  Problem # 1:  DEPRESSION (ICD-311) Assessment Deteriorated  The following medications were removed from the medication list:    Cymbalta 60 Mg Cpep (Duloxetine hcl) .Marland Kitchen... 1 tablet by mouth once daily Her updated medication list for this problem includes:    Valium 5 Mg Tabs (Diazepam) .Marland Kitchen... Take 2 tab by mouth at bedtime and as needed    Vistaril 50 Mg Caps (Hydroxyzine pamoate) .Marland Kitchen... 2  by mouth at bedtime as needed insomnia    Pristiq 50 Mg Xr24h-tab (Desvenlafaxine succinate) .Marland Kitchen... 1 by mouth qd  Problem # 2:  DIARRHEA, ACUTE (ICD-787.91) due to Cymbalta Assessment: Comment Only Resolved Her updated medication list for this problem includes:    Align Caps (Misc intestinal flora regulat) ..... Once daily  Problem # 3:  FIBROMYALGIA (ICD-729.1) Assessment: Deteriorated  Her updated medication list for this problem includes:    Vicodin Hp 10-660 Mg Tabs (Hydrocodone-acetaminophen) .Marland Kitchen... Take 1 tablet by mouth once a day and as needed    Zanaflex 4 Mg Tabs (Tizanidine hcl) .Marland Kitchen... Take 1 tablet by mouth three times a day  Problem # 4:  SWEATING (ICD-780.8) Assessment: Unchanged  Complete Medication List: 1)  Metformin Hcl  500 Mg Tb24 (Metformin hcl) .... Take 1 tablet by mouth two times a day 2)  Vicodin Hp 10-660 Mg Tabs (Hydrocodone-acetaminophen) .... Take 1 tablet by mouth once a day and as needed 3)  Valium 5 Mg Tabs (Diazepam) .... Take 2 tab by mouth at bedtime and as needed 4)  Zanaflex 4 Mg Tabs (Tizanidine hcl) .... Take 1 tablet by mouth three times a day 5)  Promethazine Hcl 25 Mg Tabs (Promethazine hcl) .Marland Kitchen.. 1 or 1/2  q 6h as needed nausea 6)  Furosemide 20 Mg Tabs (Furosemide) .Marland Kitchen.. 1 by mouth once daily prn 7)  Vitamin D3 1000 Unit Tabs (Cholecalciferol) .... 2000 international units once daily 8)  Proair Hfa 108 (90 Base) Mcg/act Aers (Albuterol sulfate) .... 2 inh q4h as needed shortness of breath 9)  Zyrtec Hives Relief 10 Mg Tabs (Cetirizine hcl) .Marland KitchenMarland KitchenMarland Kitchen  Take 1 tablet by mouth once a day 10)  Premarin 0.9 Mg Tabs (Estrogens conjugated) .Marland Kitchen.. 1 by mouth qd 11)  Vistaril 50 Mg Caps (Hydroxyzine pamoate) .... 2  by mouth at bedtime as needed insomnia 12)  Align Caps (Misc intestinal flora regulat) .... Once daily 13)  Aciphex 20 Mg Tbec (Rabeprazole sodium) .... Take 1 tablet by mouth once a day 14)  Onetouch Ultra Test Strp (Glucose blood) .... Use once to twice a day as directed 15)  Restasis 0.05 % Emul (Cyclosporine) .Marland Kitchen.. 1 drop each eye once daily 16)  Duoneb 0.5-2.5 (3) Mg/32ml Soln (Ipratropium-albuterol) .Marland Kitchen.. 1 via hhn qid prn 17)  Cortisone Shot  .... Q 3 months 18)  Promethazine-codeine 6.25-10 Mg/49ml Syrp (Promethazine-codeine) .... 5-10 ml by mouth q id as needed cough 19)  Triamcinolone Acetonide 0.1 % Crea (Triamcinolone acetonide) .... Three times a day as needed rash 20)  Pristiq 50 Mg Xr24h-tab (Desvenlafaxine succinate) .Marland Kitchen.. 1 by mouth qd 21)  Levsin/sl 0.125 Mg Subl (Hyoscyamine sulfate) .Marland Kitchen.. 1 sl qid  prn  Other Orders: Flu Vaccine 66yrs + MEDICARE PATIENTS (I3474) Administration Flu vaccine - MCR (Q5956)  Patient Instructions: 1)  Please schedule a follow-up appointment in 1  month. 2)  Labs tomorrow 3)  BMP prior to visit, ICD-9: 4)  Hepatic Panel prior to visit, ICD-9: 5)  Lipid Panel prior to visit, ICD-9: 6)  TSH prior to visit, ICD-9:250.00 995.20 7)  HbgA1C prior to visit, ICD-9: 8)  Urine Microalbumin prior to visit, ICD-9: 9)  Vit B12 356.9 10)  Vit D 268.9 Prescriptions: LEVSIN/SL 0.125 MG SUBL (HYOSCYAMINE SULFATE) 1 sl qid  prn Brand medically necessary #120 x 6   Entered and Authorized by:   Tresa Garter MD   Signed by:   Tresa Garter MD on 07/20/2010   Method used:   Print then Give to Patient   RxID:   [id number] ACIPHEX 20 MG TBEC (RABEPRAZOLE SODIUM) Take 1 tablet by mouth once a day  #30 x 11   Entered and Authorized by:   Tresa Garter MD   Signed by:   Tresa Garter MD on 07/20/2010   Method used:   Print then Give to Patient   RxID:   [id number] PRISTIQ 50 MG XR24H-TAB (DESVENLAFAXINE SUCCINATE) 1 by mouth qd  #30 x 6   Entered and Authorized by:   Tresa Garter MD   Signed by:   Tresa Garter MD on 07/20/2010   Method used:   Print then Give to Patient   RxID:   [id number]    Orders Added: 1)  Flu Vaccine 10yrs + MEDICARE PATIENTS [Q2039] 2)  Administration Flu vaccine - MCR [G0008] 3)  Est. Patient Level IV [62831]    Influenza Vaccine (to be given today) Flu Vaccine Consent Questions     Do you have a history of severe allergic reactions to this vaccine? no    Any prior history of allergic reactions to egg and/or gelatin? no    Do you have a sensitivity to the preservative Thimersol? no    Do you have a past history of Guillan-Barre Syndrome? no    Do you currently have an acute febrile illness? no    Have you ever had a severe reaction to latex? no    Vaccine information given and explained to patient? yes    Are you currently pregnant? no    Lot Number:[id number]   Exp Date:03/25/2011   Site  Given  Left Deltoid IM Lanier Prude, [facility])  July 20, 2010 5:08  PM

## 2010-10-25 NOTE — Progress Notes (Signed)
Summary: Call Report  Phone Note Other Incoming   Caller: [facility] Call Report Summary of Call: [facility] Triage Call Report Triage Record Num: [id number] Operator: Jacques Navy Patient Name: [name] Call Date & Time: 01/26/2010 9:55:53PM Patient Phone: [phone] PCP: Sonda Primes Patient Gender: Female PCP Fax : [phone] Patient DOB: [birth year] Practice Name: [facility] Reason for Call: Calling about heart palpitations,flutter, irreg heart beat,HR 46, little SOB at rest, and feeling anxious. Onset 01-26-10. Afebrile. Activity normal for her. Instructed to go to [facility] ER now. Protocol(s) Used: Irregular Heartbeat Recommended Outcome per Protocol: See ED Immediately Reason for Outcome: Acute onset of rapid pulse (greater than 120 beats/minute at rest) OR slow pulse (less than 50 beats per minute at rest) Care Advice:  ~ Protect the patient from falling or other harm.  ~ Another adult should drive.  ~ Do not give the patient anything to eat or drink. Write down provider's name. List or place the following in a bag for transport with the patient: current prescription and/or OTC medications; alternative treatments, therapies and medications; and street drugs.  ~  ~ Tell provider if using any impotence medications (such as Viagra).  ~ IMMEDIATE ACTION  ~ CAUTIONS 05/ Initial call taken by: Margaret Pyle, CMA,  Jan 27, 2010 8:45 AM

## 2010-10-25 NOTE — Progress Notes (Signed)
Summary: Rf diazepam  Phone Note Refill Request Message from:  Fax from Pharmacy  Refills Requested: Medication #1:  VALIUM 5 MG  TABS Take 2 tab by mouth at bedtime and as needed   Dosage confirmed as above?Dosage Confirmed   Supply Requested: 60   Last Refilled: 06/26/2010  Method Requested: Telephone to Pharmacy Next Appointment Scheduled: 08-26-10 Initial call taken by: Lanier Prude, [facility]),  August 04, 2010 11:36 AM  Follow-up for Phone Call        ok 2 ref Follow-up by: Tresa Garter MD,  August 05, 2010 9:15 AM  Additional Follow-up for Phone Call Additional follow up Details #1::        Rx called to pharmacy Additional Follow-up by: Lanier Prude, [facility]),  August 05, 2010 9:38 AM    Prescriptions: VALIUM 5 MG  TABS (DIAZEPAM) Take 2 tab by mouth at bedtime and as needed  #60 x 2   Entered by:   Lanier Prude, [facility])   Authorized by:   Tresa Garter MD   Signed by:   Lanier Prude, CMA(AAMA) on 08/05/2010   Method used:   [facility] to ...       [facility]  [address] [id number]       [address]       [place], [place]  [address]       Ph: [phone] or [phone]       Fax: [phone]   RxID:   [id number]

## 2010-10-25 NOTE — Assessment & Plan Note (Signed)
Summary: rov after pft ///kp   Visit Type:  Follow-up Copy to:  Dr. Romero Belling Primary Provider/Referring Provider:  Sonda Primes, MD  CC:  Pt here for follow up with pre and post spirometry. Pt states d/c Dulera since last OV.  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 She was admitted 4/1-12/27/09 for an exacerbation requiring Abx & steroids, destaurated to 87% on discharge but did not require home O2. She has since improved, quit smoking & did not desaturate on ambulating today. Cough & wheezing have resolved, has gained 10  lbs with steroids & smoking cessation.  Husband has sleep apnea & has noted loud snoring, but not witnessed apneas. She reports frequent heartburn , s/p fundoplication '99 & esophageal dilation of UES by Dr Juanda Chance. IBS & fibromyalgia prevent her from exercisie.  March 09, 2010 10:16 AM  Has been off dulera - not needed rescue MDIs. Spirometry showed nml lung function (took duoneb 6 h prior to test)  Reviewed pSG 5/02 - wt 230 lbs  -moderate obstructive sleep apnea with RDI 17/h, nadir desatn 87% & few PLMS but not causing arousals - has gained 20 lbs since.  Current Medications (verified): 1)  Metformin Hcl 500 Mg  Tb24 (Metformin Hcl) .... Take 1 Tablet By Mouth Two Times A Day 2)  Vicodin Hp 10-660 Mg  Tabs (Hydrocodone-Acetaminophen) .... Take 1 Tablet By Mouth Once A Day and As Needed 3)  Valium 5 Mg  Tabs (Diazepam) .... Take 2 Tab By Mouth At Bedtime and As Needed 4)  Zanaflex 4 Mg  Tabs (Tizanidine Hcl) .... Take 1 Tablet By Mouth Three Times A Day 5)  Promethazine Hcl 25 Mg  Tabs (Promethazine Hcl) .Marland Kitchen.. 1 or 1/2  Q 6h As Needed Nausea 6)  Furosemide 20 Mg  Tabs (Furosemide) .Marland Kitchen.. 1 By Mouth Once Daily Prn 7)  Cymbalta 60 Mg Cpep (Duloxetine Hcl) .Marland Kitchen.. 1 Tablet By Mouth Once Daily 8)  Vitamin D3 1000 Unit  Tabs (Cholecalciferol) .... 2000 International Units Once Daily 9)  Proair Hfa 108  (90 Base) Mcg/act  Aers (Albuterol Sulfate) .... 2 Inh Q4h As Needed Shortness of Breath 10)  Zyrtec Hives Relief 10 Mg Tabs (Cetirizine Hcl) .... Take 1 Tablet By Mouth Once A Day 11)  Premarin 0.9 Mg  Tabs (Estrogens Conjugated) .Marland Kitchen.. 1 By Mouth Qd 12)  Vistaril 50 Mg  Caps (Hydroxyzine Pamoate) .... 2  By Mouth At Bedtime As Needed Insomnia 13)  Align  Caps (Misc Intestinal Flora Regulat) .... Once Daily 14)  Aciphex 20 Mg Tbec (Rabeprazole Sodium) .... Take 1 Tablet By Mouth Once A Day 15)  Onetouch Ultra Test  Strp (Glucose Blood) .... Use Once To Twice A Day As Directed 16)  Levbid 0.375 Mg Xr12h-Tab (Hyoscyamine Sulfate) .... Take One By Mouth Every 12 Hours As Needed For Abd. Pain. 17)  Restasis 0.05 % Emul (Cyclosporine) .Marland Kitchen.. 1 Drop Each Eye Once Daily 18)  Duoneb 0.5-2.5 (3) Mg/67ml Soln (Ipratropium-Albuterol) .Marland Kitchen.. 1 Via Hhn Qid Prn 19)  Cortisone Shot .... Q 3 Months 20)  Promethazine-Codeine 6.25-10 Mg/89ml Syrp (Promethazine-Codeine) .... 5-10 Ml By Mouth Q Id As Needed Cough 21)  Triamcinolone Acetonide 0.1 % Crea (Triamcinolone Acetonide) .... Three Times A Day As Needed Rash  Allergies (verified): 1)  ! Avelox (Moxifloxacin Hcl) 2)  ! Topamax (Topiramate) 3)  ! Neomycin-Bacitracin Zn-Polymyx (Neomycin-Bacitracin Zn-Polymyx) 4)  ! Toradol 5)  ! * Dilaudid  6)  ! Lyrica (Pregabalin) 7)  ! * Ivp Dye 8)  Actos (Pioglitazone Hcl) 9)  Augmentin (Amoxicillin-Pot Clavulanate)  Past History:  Past Medical History: Last updated: 01/19/2009 Asthma COPD Depression GERD  Dr Juanda Chance Migraines Menopause IBS FMS Possible MRSA-  skin (by verbal report from pt) Diabetes mellitus, type II Peripheral neuropathy Gastroparesis  Social History: Last updated: 01/25/2010 Occupation: CMA disabled Married, lives with husband and grandchild Children: yes 3 Regular exercise-no Alcohol Use - no Daily Caffeine Use Patient states former smoker. (stopped April 2011)  Risk  Factors: Smoking Status: quit (01/25/2010) Packs/Day: 0.5 (01/25/2010)  Review of Systems  The patient denies anorexia, fever, weight loss, weight gain, vision loss, decreased hearing, hoarseness, chest pain, syncope, dyspnea on exertion, peripheral edema, prolonged cough, headaches, hemoptysis, abdominal pain, melena, hematochezia, severe indigestion/heartburn, hematuria, incontinence, suspicious skin lesions, transient blindness, difficulty walking, depression, unusual weight change, and abnormal bleeding.    Vital Signs:  Patient profile:   55 year old female Height:      65 inches Weight:      250 pounds BMI:     41.75 O2 Sat:      93 % on Room air Temp:     98.1 degrees F oral Pulse rate:   98 / minute BP sitting:   124 / 80  (right arm) Cuff size:   large  Vitals Entered By: Zackery Barefoot CMA (March 09, 2010 10:03 AM)  O2 Flow:  Room air CC: Pt here for follow up with pre and post spirometry. Pt states d/c Dulera since last OV Is Patient Diabetic? Yes Comments Medications reviewed with patient Verified contact number and pharmacy with patient Zackery Barefoot CMA  March 09, 2010 10:09 AM    Physical Exam  Additional Exam:  Gen. Pleasant, obese, in no distress, normal affect ENT - no lesions, no post nasal drip, class 2 airway Neck: No JVD, no thyromegaly, no carotid bruits Lungs: no use of accessory muscles, no dullness to percussion, decreased  without rales or rhonchi  Cardiovascular: Rhythm regular, heart sounds  normal, no murmurs or gallops, no peripheral edema Musculoskeletal: No deformities, no cyanosis or clubbing      Impression & Recommendations:  Problem # 1:  ASTHMA UNSPECIFIED (ICD-493.90)  breathing is made worse by allergies & reflux  OK to stop dulera.  Problem # 2:  COPD (ICD-496) Assessment: Comment Only does not have COPD  Problem # 3:  OBSTRUCTIVE SLEEP APNEA (ICD-780.57)  Consider rpt study given wt gain & non refrshing  sleep.  Orders: Est. Patient Level III (13244)  Medications Added to Medication List This Visit: 1)  Vicodin Hp 10-660 Mg Tabs (Hydrocodone-acetaminophen) .... Take 1 tablet by mouth once a day and as needed 2)  Valium 5 Mg Tabs (Diazepam) .... Take 2 tab by mouth at bedtime and as needed  Patient Instructions: 1)  Copy sent to: [facility] 2)  Please schedule a follow-up appointment in 6 months with TP. 3)  Your breathing is made worse by allergies & reflux - so we have to keep these in check 4)  YOu DO NOT have COPD. 5)  Congratulation on quitting smoking. 6)  Stop dulera 7)  Take albuterol MDI/ neb as needed only  8)  Weight loss

## 2010-10-25 NOTE — Miscellaneous (Signed)
Summary: Orders Update pft charges  Clinical Lists Changes  Orders: Added new Service order of Spirometry (Pre & Post) (94060) - Signed 

## 2010-10-25 NOTE — Progress Notes (Signed)
Summary: Rf Hydrco-Acetamin  Phone Note Refill Request Message from:  Fax from Pharmacy  Refills Requested: Medication #1:  VICODIN HP 10-660 MG  TABS Take 1 tablet by mouth once a day and as needed   Dosage confirmed as above?Dosage Confirmed   Supply Requested: 120   Last Refilled: 06/08/2010  Method Requested: Telephone to Pharmacy Initial call taken by: Lanier Prude, [facility]),  July 20, 2010 5:09 PM  Follow-up for Phone Call        ok 2 ref Follow-up by: Tresa Garter MD,  July 21, 2010 7:31 AM    Prescriptions: VICODIN HP 10-660 MG  TABS (HYDROCODONE-ACETAMINOPHEN) Take 1 tablet by mouth once a day and as needed  #120 x 1   Entered by:   Lamar Sprinkles, CMA   Authorized by:   Tresa Garter MD   Signed by:   Lamar Sprinkles, CMA on 07/21/2010   Method used:   Telephoned to ...       [facility]  [address] [id number] (retail)       [address]       [place], [place]  [address]       Ph: [phone] or [phone]       Fax: [phone]   RxID:   [id number]

## 2010-10-25 NOTE — Assessment & Plan Note (Signed)
Summary: 3 MO ROV/NWS  #   Vital Signs:  Patient profile:   55 year old female Height:      65 inches Weight:      247 pounds BMI:     41.25 O2 Sat:      96 % on Room air Temp:     98.3 degrees F oral Pulse rate:   101 / minute BP sitting:   124 / 80  (left arm) Cuff size:   large  Vitals Entered By: Bill Salinas CMA (Feb 16, 2010 11:24 AM)  O2 Flow:  Room air CC: follow-up visit, pt has quit smoking on April 01,2011/ ab   Primary Care Provider:  Sonda Primes, MD  CC:  follow-up visit, pt has quit smoking on April 01, and 2011/ ab.  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  She was admitted 4/1-12/27/09 for a severe COPd exacerbation requiring Abx & steroids, destaurated to 87% on discharge but did not require home O2. She has since improved, quit smoking & did not desaturate on ambulating today. She is maintained on dulera, did not take spiriva after reading about side effects. Cough & wheezing have resolved, has gained 10  lbs with steroids & smoking cessation.  Husband has sleep apnea & has noted loud snoring, but not witnessed apneas. She reports frequent heartburn , s/p fundoplication '99 & esophageal dilation of UES by Dr Juanda Chance. IBS & fibromyalgia prevent her from exercisie. Starting Pulm. Rehab soon. C/o L breast pain  Current Medications (verified): 1)  Metformin Hcl 500 Mg  Tb24 (Metformin Hcl) .... Take 1 Tablet By Mouth Two Times A Day 2)  Vicodin Hp 10-660 Mg  Tabs (Hydrocodone-Acetaminophen) .Marland Kitchen.. 1 Po Qid As Needed 3)  Valium 5 Mg  Tabs (Diazepam) .Marland Kitchen.. 1 Po Bid As Needed 4)  Zanaflex 4 Mg  Tabs (Tizanidine Hcl) .... Take 1 Tablet By Mouth Three Times A Day 5)  Promethazine Hcl 25 Mg  Tabs (Promethazine Hcl) .Marland Kitchen.. 1 or 1/2  Q 6h As Needed Nausea 6)  Furosemide 20 Mg  Tabs (Furosemide) .Marland Kitchen.. 1 By Mouth Once Daily Prn 7)  Cymbalta 60 Mg Cpep (Duloxetine Hcl) .Marland Kitchen.. 1 Tablet By Mouth Once  Daily 8)  Vitamin D3 1000 Unit  Tabs (Cholecalciferol) .... 2000 International Units Once Daily 9)  Proair Hfa 108 (90 Base) Mcg/act  Aers (Albuterol Sulfate) .... 2 Inh Q4h As Needed Shortness of Breath 10)  Zyrtec Hives Relief 10 Mg Tabs (Cetirizine Hcl) .... Take 1 Tablet By Mouth Once A Day 11)  Premarin 0.9 Mg  Tabs (Estrogens Conjugated) .Marland Kitchen.. 1 By Mouth Qd 12)  Vistaril 50 Mg  Caps (Hydroxyzine Pamoate) .... 2  By Mouth At Bedtime As Needed Insomnia 13)  Align  Caps (Misc Intestinal Flora Regulat) .... Once Daily 14)  Aciphex 20 Mg Tbec (Rabeprazole Sodium) .... Take 1 Tablet By Mouth Once A Day 15)  Onetouch Ultra Test  Strp (Glucose Blood) .... Use Once To Twice A Day As Directed 16)  Levbid 0.375 Mg Xr12h-Tab (Hyoscyamine Sulfate) .... Take One By Mouth Every 12 Hours As Needed For Abd. Pain. 17)  Restasis 0.05 % Emul (Cyclosporine) .Marland Kitchen.. 1 Drop Each Eye Once Daily 18)  Duoneb 0.5-2.5 (3) Mg/82ml Soln (Ipratropium-Albuterol) .Marland Kitchen.. 1 Via Hhn Qid Prn 19)  Dulera 100-5 Mcg/act Aero (Mometasone Furo-Formoterol Fum) .... 2 Puffs Bid 20)  Cortisone Shot .... Q 3 Months 21)  Promethazine-Codeine 6.25-10 Mg/54ml Syrp (Promethazine-Codeine) .... 5-10 Ml By Mouth Q Id As Needed Cough 22)  Triamcinolone Acetonide 0.1 % Crea (Triamcinolone Acetonide) .... Three Times A Day As Needed Rash  Allergies (verified): 1)  ! Avelox (Moxifloxacin Hcl) 2)  ! Topamax (Topiramate) 3)  ! Neomycin-Bacitracin Zn-Polymyx (Neomycin-Bacitracin Zn-Polymyx) 4)  ! Toradol 5)  ! * Dilaudid 6)  ! Lyrica (Pregabalin) 7)  ! * Ivp Dye 8)  Actos (Pioglitazone Hcl) 9)  Augmentin (Amoxicillin-Pot Clavulanate)  Past History:  Past Medical History: Last updated: 01/19/2009 Asthma COPD Depression GERD  Dr Juanda Chance Migraines Menopause IBS FMS Possible MRSA-  skin (by verbal report from pt) Diabetes mellitus, type II Peripheral neuropathy Gastroparesis  Past Surgical History: Last updated: 02/02/2009 Hysterectomy   complete Sinus surgery  x 2 abd u/s colonoscopy  1994 Nissen Fundoplication  1999 Surgery for Left heel spur Appendectomy VENTRAL HERNIA REPAIR WITH MESH Cholecystectomy  Family History: Last updated: 04/16/2008 Family History of Arthritis Family History Diabetes 1st degree relative Mother and Father MGrandparents Family History Hypertension B MI 39 Family History of Heart Disease: Maternal Grandmother brother died MI Family History of Breast Cancer: Maternal Grandmother Family History of Colon Cancer:MGF PGF Family History of Colon Polyps:father PGF and MGF Lung Cancer-mother died age 79 Family History of Uterine Cancer:Great grandmother Family History of Esophageal Cancer:father  Social History: Last updated: 01/25/2010 Occupation: CMA disabled Married, lives with husband and grandchild Children: yes 3 Regular exercise-no Alcohol Use - no Daily Caffeine Use Patient states former smoker. (stopped April 2011)  Review of Systems       The patient complains of dyspnea on exertion, difficulty walking, and depression.  The patient denies fever, weight loss, weight gain, prolonged cough, and abdominal pain.    Physical Exam  General:  NAD, not  pale,  less sweaty Nose:  External nasal examination shows no deformity or inflammation. Nasal mucosa are pink and moist without lesions or exudates. Mouth:  No erythematous throat mucosa and intranasal erythema.  Neck:  supple, full ROM, no masses, no thyromegaly, no thyroid nodules or tenderness, no JVD, normal carotid upstroke, no carotid bruits, and no cervical lymphadenopathy.   Lungs:  No wheezes Heart:  RRR Abdomen:  R groin is a little tender; no masses, no rebound tenderness, no inguinal hernia, no hepatomegaly, and no splenomegaly.   Msk:  Lumbar-sacral spine is tender to palpation over paraspinal muscles and painfull with the ROM  Extremities:  No clubbing, cyanosis, edema, or deformity noted with normal full range of  motion of all joints.   Neurologic:  No cranial nerve deficits noted. Station and gait are normal. Plantar reflexes are down-going bilaterally. DTRs are symmetrical throughout. Sensory, motor and coordinative functions appear intact. Skin:  turgor normal, color normal,  no ecchymoses, no petechiae, no purpura, no ulcerations, and no edema.  Intertrigo present Cervical Nodes:  no anterior cervical adenopathy and no posterior cervical adenopathy.   Psych:  Cognition and judgment appear intact. Alert and cooperative with normal attention span and concentration. No apparent delusions, illusions, hallucinationsgood eye contact, not suicidal, subdued, and tearful.     Impression & Recommendations:  Problem # 1:  ASTHMA UNSPECIFIED (ICD-493.90) Assessment Unchanged  Her updated medication list for this problem includes:    Proair Hfa 108 (90 Base) Mcg/act Aers (Albuterol sulfate) .Marland Kitchen... 2 inh q4h as needed shortness of breath    Duoneb 0.5-2.5 (3) Mg/72ml Soln (Ipratropium-albuterol) .Marland Kitchen... 1 via hhn qid prn    Dulera 100-5 Mcg/act Aero (  Mometasone furo-formoterol fum) .Marland Kitchen... 2 puffs bid  Problem # 2:  GERD (ICD-530.81) Assessment: Comment Only  Her updated medication list for this problem includes:    Aciphex 20 Mg Tbec (Rabeprazole sodium) .Marland Kitchen... Take 1 tablet by mouth once a day    Levbid 0.375 Mg Xr12h-tab (Hyoscyamine sulfate) .Marland Kitchen... Take one by mouth every 12 hours as needed for abd. pain.  Problem # 3:  FIBROMYALGIA (ICD-729.1) Assessment: Unchanged  Her updated medication list for this problem includes:    Vicodin Hp 10-660 Mg Tabs (Hydrocodone-acetaminophen) .Marland Kitchen... 1 po qid as needed    Zanaflex 4 Mg Tabs (Tizanidine hcl) .Marland Kitchen... Take 1 tablet by mouth three times a day  Problem # 4:  BARRETT'S ESOPHAGUS, HX OF (ICD-V12.79)  Problem # 5:  SWEATING (ICD-780.8) Assessment: Unchanged  Problem # 6:  COPD (ICD-496) Started Pulm Rehab Her updated medication list for this problem includes:     Proair Hfa 108 (90 Base) Mcg/act Aers (Albuterol sulfate) .Marland Kitchen... 2 inh q4h as needed shortness of breath    Duoneb 0.5-2.5 (3) Mg/64ml Soln (Ipratropium-albuterol) .Marland Kitchen... 1 via hhn qid prn    Dulera 100-5 Mcg/act Aero (Mometasone furo-formoterol fum) .Marland Kitchen... 2 puffs bid  Problem # 7:  BREAST PAIN, LEFT (ICD-611.71) Assessment: Deteriorated  Orders: Radiology Referral (Radiology)  Complete Medication List: 1)  Metformin Hcl 500 Mg Tb24 (Metformin hcl) .... Take 1 tablet by mouth two times a day 2)  Vicodin Hp 10-660 Mg Tabs (Hydrocodone-acetaminophen) .Marland Kitchen.. 1 po qid as needed 3)  Valium 5 Mg Tabs (Diazepam) .Marland Kitchen.. 1 po bid as needed 4)  Zanaflex 4 Mg Tabs (Tizanidine hcl) .... Take 1 tablet by mouth three times a day 5)  Promethazine Hcl 25 Mg Tabs (Promethazine hcl) .Marland Kitchen.. 1 or 1/2  q 6h as needed nausea 6)  Furosemide 20 Mg Tabs (Furosemide) .Marland Kitchen.. 1 by mouth once daily prn 7)  Cymbalta 60 Mg Cpep (Duloxetine hcl) .Marland Kitchen.. 1 tablet by mouth once daily 8)  Vitamin D3 1000 Unit Tabs (Cholecalciferol) .... 2000 international units once daily 9)  Proair Hfa 108 (90 Base) Mcg/act Aers (Albuterol sulfate) .... 2 inh q4h as needed shortness of breath 10)  Zyrtec Hives Relief 10 Mg Tabs (Cetirizine hcl) .... Take 1 tablet by mouth once a day 11)  Premarin 0.9 Mg Tabs (Estrogens conjugated) .Marland Kitchen.. 1 by mouth qd 12)  Vistaril 50 Mg Caps (Hydroxyzine pamoate) .... 2  by mouth at bedtime as needed insomnia 13)  Align Caps (Misc intestinal flora regulat) .... Once daily 14)  Aciphex 20 Mg Tbec (Rabeprazole sodium) .... Take 1 tablet by mouth once a day 15)  Onetouch Ultra Test Strp (Glucose blood) .... Use once to twice a day as directed 16)  Levbid 0.375 Mg Xr12h-tab (Hyoscyamine sulfate) .... Take one by mouth every 12 hours as needed for abd. pain. 17)  Restasis 0.05 % Emul (Cyclosporine) .Marland Kitchen.. 1 drop each eye once daily 18)  Duoneb 0.5-2.5 (3) Mg/54ml Soln (Ipratropium-albuterol) .Marland Kitchen.. 1 via hhn qid prn 19)  Dulera  100-5 Mcg/act Aero (Mometasone furo-formoterol fum) .... 2 puffs bid 20)  Cortisone Shot  .... Q 3 months 21)  Promethazine-codeine 6.25-10 Mg/38ml Syrp (Promethazine-codeine) .... 5-10 ml by mouth q id as needed cough 22)  Triamcinolone Acetonide 0.1 % Crea (Triamcinolone acetonide) .... Three times a day as needed rash  Patient Instructions: 1)  Please schedule a follow-up appointment in 3 months. 2)  BMP prior to visit, ICD-9: 3)  Hepatic Panel prior to visit,  ICD-9: 4)  TSH prior to visit, ICD-9: 250.00  995.20 5)  CBC w/ Diff prior to visit, ICD-9: 6)  Urine-dip prior to visit, ICD-9: 7)  HbgA1C prior to visit, ICD-9:  Prescriptions: VISTARIL 50 MG  CAPS (HYDROXYZINE PAMOATE) 2  by mouth at bedtime as needed insomnia  #60 x 3   Entered and Authorized by:   Tresa Garter MD   Signed by:   Tresa Garter MD on 02/16/2010   Method used:   Print then Give to Patient   RxID:   [id number] VALIUM 5 MG  TABS (DIAZEPAM) 1 po bid as needed  #60 x 3   Entered and Authorized by:   Tresa Garter MD   Signed by:   Tresa Garter MD on 02/16/2010   Method used:   Print then Give to Patient   RxID:   [id number] VICODIN HP 10-660 MG  TABS (HYDROCODONE-ACETAMINOPHEN) 1 po qid as needed  #120 x 3   Entered and Authorized by:   Tresa Garter MD   Signed by:   Tresa Garter MD on 02/16/2010   Method used:   Print then Give to Patient   RxID:   [id number]

## 2010-10-25 NOTE — Assessment & Plan Note (Signed)
Summary: 3 mos f/u   #/cd   Vital Signs:  Patient profile:   55 year old female Weight:      239 pounds Temp:     98.1 degrees F oral Pulse rate:   102 / minute BP sitting:   102 / 80  (left arm)  Vitals Entered By: Tora Perches (November 17, 2009 11:06 AM) CC: f/u Is Patient Diabetic? Yes Comments pt state cbg was 140mg /dl this am/vg   Primary Care Provider:  Sonda Primes, MD  CC:  f/u.  History of Present Illness: The patient presents for a follow up of hypertension, diabetes, hyperlipidemia C/o dizziness and more fatigue, diarrhea x 3-4 days  Preventive Screening-Counseling & Management  Alcohol-Tobacco     Smoking Status: current  Current Medications (verified): 1)  Metformin Hcl 500 Mg  Tb24 (Metformin Hcl) .Marland Kitchen.. 1 By Mouth Bid 2)  Vicodin Hp 10-660 Mg  Tabs (Hydrocodone-Acetaminophen) .Marland Kitchen.. 1 Po Qid As Needed 3)  Valium 5 Mg  Tabs (Diazepam) .Marland Kitchen.. 1 Po Bid As Needed 4)  Zanaflex 4 Mg  Tabs (Tizanidine Hcl) .Marland Kitchen.. 1 By Mouth Once Daily As Needed Three Times A Day 5)  Promethazine Hcl 25 Mg  Tabs (Promethazine Hcl) .Marland Kitchen.. 1 or 1/2  Q 6h As Needed Nausea 6)  Furosemide 20 Mg  Tabs (Furosemide) .Marland Kitchen.. 1 By Mouth Once Daily Prn 7)  Cymbalta 60 Mg Cpep (Duloxetine Hcl) .Marland Kitchen.. 1 Tablet By Mouth Once Daily 8)  Vitamin D3 1000 Unit  Tabs (Cholecalciferol) .... 2000 International Units Once Daily 9)  Proair Hfa 108 (90 Base) Mcg/act  Aers (Albuterol Sulfate) .... 2 Inh Q4h As Needed Shortness of Breath 10)  Welchol 625 Mg  Tabs (Colesevelam Hcl) .... 1/2 Tablet By Mouth Once Daily Diarrhea 11)  Singulair 10 Mg  Tabs (Montelukast Sodium) .... Take 1 By Mouth Qd 12)  Premarin 0.9 Mg  Tabs (Estrogens Conjugated) .Marland Kitchen.. 1 By Mouth Qd 13)  Vistaril 50 Mg  Caps (Hydroxyzine Pamoate) .Marland Kitchen.. 1-2  By Mouth At Bedtime As Needed Insomnia 14)  Align  Caps (Misc Intestinal Flora Regulat) .... Once Daily 15)  Aciphex 20 Mg Tbec (Rabeprazole Sodium) .... Take 1 Tablet By Mouth Once A Day 16)  Onetouch  Ultra Test  Strp (Glucose Blood) .... Use Once To Twice A Day As Directed 17)  Levbid 0.375 Mg Xr12h-Tab (Hyoscyamine Sulfate) .... Take One By Mouth Every 12 Hours As Needed For Abd. Pain. 18)  Restasis 0.05 % Emul (Cyclosporine) .Marland Kitchen.. 1 Drop Each Eye Once Daily 19)  Duoneb 0.5-2.5 (3) Mg/43ml Soln (Ipratropium-Albuterol) .Marland Kitchen.. 1 Via Hhn Qid Prn 20)  Dulera 100-5 Mcg/act Aero (Mometasone Furo-Formoterol Fum) .... 2 Puffs Bid 21)  Cortisone Shot .... Q 3 Months 22)  Prednisone 10 Mg Tabs (Prednisone) .... Take 40mg  Qd For 3 Days, Then 20 Mg Qd For 3 Days, Then 10mg  Qd For 6 Days, Then Stop. Take Pc. 23)  Promethazine-Codeine 6.25-10 Mg/77ml Syrp (Promethazine-Codeine) .... 5-10 Ml By Mouth Q Id As Needed Cough  Allergies: 1)  ! Avelox (Moxifloxacin Hcl) 2)  ! Topamax (Topiramate) 3)  ! Neomycin-Bacitracin Zn-Polymyx (Neomycin-Bacitracin Zn-Polymyx) 4)  ! Toradol 5)  ! * Dilaudid 6)  ! Lyrica (Pregabalin) 7)  Actos (Pioglitazone Hcl)  Past History:  Past Medical History: Last updated: 01/19/2009 Asthma COPD Depression GERD  Dr Juanda Chance Migraines Menopause IBS FMS Possible MRSA-  skin (by verbal report from pt) Diabetes mellitus, type II Peripheral neuropathy Gastroparesis  Social History: Last updated: 04/16/2008 Occupation:  RN disabled Married Current Smoker Regular exercise-no Alcohol Use - no Daily Caffeine Use  Review of Systems       The patient complains of abdominal pain and depression.  The patient denies fever, chest pain, melena, and severe indigestion/heartburn.    Physical Exam  General:  NAD, pale, sweaty Eyes:  No corneal or conjunctival inflammation noted. EOMI. Perrla.  Nose:  External nasal examination shows no deformity or inflammation. Nasal mucosa are pink and moist without lesions or exudates. Mouth:  Erythematous throat mucosa and intranasal erythema.  Lungs:  No wheezes Heart:  RRR Abdomen:  R groin is a little tenderno masses, no rebound  tenderness, no inguinal hernia, no hepatomegaly, and no splenomegaly.   Msk:  Lumbar-sacral spine is tender to palpation over paraspinal muscles and painfull with the ROM  Extremities:  No clubbing, cyanosis, edema, or deformity noted with normal full range of motion of all joints.   Neurologic:  No cranial nerve deficits noted. Station and gait are normal. Plantar reflexes are down-going bilaterally. DTRs are symmetrical throughout. Sensory, motor and coordinative functions appear intact. Skin:  turgor normal, color normal,  no ecchymoses, no petechiae, no purpura, no ulcerations, and no edema.  Intertrigo present Psych:  Cognition and judgment appear intact. Alert and cooperative with normal attention span and concentration. No apparent delusions, illusions, hallucinationsgood eye contact, not suicidal, subdued, and tearful.     Impression & Recommendations:  Problem # 1:  DIARRHEA (ICD-787.91) Assessment Deteriorated  Her updated medication list for this problem includes:    Align Caps (Misc intestinal flora regulat) ..... Once daily  Problem # 2:  FIBROMYALGIA (ICD-729.1) Assessment: Deteriorated  Her updated medication list for this problem includes:    Vicodin Hp 10-660 Mg Tabs (Hydrocodone-acetaminophen) .Marland Kitchen... 1 po qid as needed    Zanaflex 4 Mg Tabs (Tizanidine hcl) .Marland Kitchen... 1 by mouth once daily as needed three times a day  Orders: TLB-B12, Serum-Total ONLY (16109-U04) TLB-BMP (Basic Metabolic Panel-BMET) (80048-METABOL) TLB-CBC Platelet - w/Differential (85025-CBCD) TLB-Hepatic/Liver Function Pnl (80076-HEPATIC) TLB-TSH (Thyroid Stimulating Hormone) (84443-TSH) TLB-A1C / Hgb A1C (Glycohemoglobin) (83036-A1C) TLB-Udip ONLY (81003-UDIP)  Problem # 3:  SWEATING (ICD-780.8) Assessment: Deteriorated Botox helped  Problem # 4:  DIABETES MELLITUS, TYPE II (ICD-250.00) Assessment: Comment Only  Her updated medication list for this problem includes:    Metformin Hcl 500 Mg Tb24  (Metformin hcl) .Marland Kitchen... 1 by mouth bid  Orders: TLB-B12, Serum-Total ONLY (54098-J19) TLB-BMP (Basic Metabolic Panel-BMET) (80048-METABOL) TLB-CBC Platelet - w/Differential (85025-CBCD) TLB-Hepatic/Liver Function Pnl (80076-HEPATIC) TLB-TSH (Thyroid Stimulating Hormone) (84443-TSH) TLB-A1C / Hgb A1C (Glycohemoglobin) (83036-A1C) TLB-Udip ONLY (81003-UDIP)  Problem # 5:  NAUSEA (ICD-787.02) Assessment: Comment Only  Problem # 6:  R groin pain Assessment: New Will watch Exam was OK  Complete Medication List: 1)  Metformin Hcl 500 Mg Tb24 (Metformin hcl) .Marland Kitchen.. 1 by mouth bid 2)  Vicodin Hp 10-660 Mg Tabs (Hydrocodone-acetaminophen) .Marland Kitchen.. 1 po qid as needed 3)  Valium 5 Mg Tabs (Diazepam) .Marland Kitchen.. 1 po bid as needed 4)  Zanaflex 4 Mg Tabs (Tizanidine hcl) .Marland Kitchen.. 1 by mouth once daily as needed three times a day 5)  Promethazine Hcl 25 Mg Tabs (Promethazine hcl) .Marland Kitchen.. 1 or 1/2  q 6h as needed nausea 6)  Furosemide 20 Mg Tabs (Furosemide) .Marland Kitchen.. 1 by mouth once daily prn 7)  Cymbalta 60 Mg Cpep (Duloxetine hcl) .Marland Kitchen.. 1 tablet by mouth once daily 8)  Vitamin D3 1000 Unit Tabs (Cholecalciferol) .... 2000 international units once daily 9)  Proair Hfa 108 (90 Base) Mcg/act Aers (Albuterol sulfate) .... 2 inh q4h as needed shortness of breath 10)  Welchol 625 Mg Tabs (Colesevelam hcl) .... 1/2 tablet by mouth once daily diarrhea 11)  Singulair 10 Mg Tabs (Montelukast sodium) .... Take 1 by mouth qd 12)  Premarin 0.9 Mg Tabs (Estrogens conjugated) .Marland Kitchen.. 1 by mouth qd 13)  Vistaril 50 Mg Caps (Hydroxyzine pamoate) .Marland Kitchen.. 1-2  by mouth at bedtime as needed insomnia 14)  Align Caps (Misc intestinal flora regulat) .... Once daily 15)  Aciphex 20 Mg Tbec (Rabeprazole sodium) .... Take 1 tablet by mouth once a day 16)  Onetouch Ultra Test Strp (Glucose blood) .... Use once to twice a day as directed 17)  Levbid 0.375 Mg Xr12h-tab (Hyoscyamine sulfate) .... Take one by mouth every 12 hours as needed for abd.  pain. 18)  Restasis 0.05 % Emul (Cyclosporine) .Marland Kitchen.. 1 drop each eye once daily 19)  Duoneb 0.5-2.5 (3) Mg/54ml Soln (Ipratropium-albuterol) .Marland Kitchen.. 1 via hhn qid prn 20)  Dulera 100-5 Mcg/act Aero (Mometasone furo-formoterol fum) .... 2 puffs bid 21)  Cortisone Shot  .... Q 3 months 22)  Promethazine-codeine 6.25-10 Mg/95ml Syrp (Promethazine-codeine) .... 5-10 ml by mouth q id as needed cough 23)  Ketoconazole 2 % Crea (Ketoconazole) .... Use bid  Other Orders: Psychology Referral (Psychology)  Patient Instructions: 1)  Please schedule a follow-up appointment in 4 months. 2)  Use the Sinus rinse as needed  Prescriptions: ZANAFLEX 4 MG  TABS (TIZANIDINE HCL) 1 by mouth once daily as needed three times a day  #90 Capsule x 1   Entered and Authorized by:   Tresa Garter MD   Signed by:   Tresa Garter MD on 11/17/2009   Method used:   [facility] to        [facility]  [address] [id number]       [address]       [place], [place]  [address]       Ph: [phone] or [phone]       Fax: [phone]   RxID:   [id number] KETOCONAZOLE 2 % CREA (KETOCONAZOLE) use bid  #90 g x 3   Entered and Authorized by:   Tresa Garter MD   Signed by:   Tresa Garter MD on 11/17/2009   Method used:   [facility]        [facility]  [address] [id number]       [address]       [place], [place]  [address]       Ph: [phone] or [phone]       Fax: [phone]   RxID:   [id number]

## 2010-10-25 NOTE — Assessment & Plan Note (Signed)
Summary: 2 DAY FU /NWS  #   Vital Signs:  Patient profile:   55 year old female Weight:      243 pounds Temp:     97 degrees F oral Pulse rate:   95 / minute BP sitting:   112 / 72  (left arm)  Vitals Entered By: Tora Perches (November 24, 2009 2:45 PM) CC: f/u Is Patient Diabetic? Yes   Primary Care Provider:  Sonda Primes, MD  CC:  f/u.  History of Present Illness: The patient presents for a follow up of LLQ abd pain - same as before   Preventive Screening-Counseling & Management  Alcohol-Tobacco     Smoking Status: current  Current Medications (verified): 1)  Metformin Hcl 500 Mg  Tb24 (Metformin Hcl) .Marland Kitchen.. 1 By Mouth Bid 2)  Vicodin Hp 10-660 Mg  Tabs (Hydrocodone-Acetaminophen) .Marland Kitchen.. 1 Po Qid As Needed 3)  Valium 5 Mg  Tabs (Diazepam) .Marland Kitchen.. 1 Po Bid As Needed 4)  Zanaflex 4 Mg  Tabs (Tizanidine Hcl) .... Take 1 Tablet By Mouth Three Times A Day 5)  Promethazine Hcl 25 Mg  Tabs (Promethazine Hcl) .Marland Kitchen.. 1 or 1/2  Q 6h As Needed Nausea 6)  Furosemide 20 Mg  Tabs (Furosemide) .Marland Kitchen.. 1 By Mouth Once Daily Prn 7)  Cymbalta 60 Mg Cpep (Duloxetine Hcl) .Marland Kitchen.. 1 Tablet By Mouth Once Daily 8)  Vitamin D3 1000 Unit  Tabs (Cholecalciferol) .... 2000 International Units Once Daily 9)  Proair Hfa 108 (90 Base) Mcg/act  Aers (Albuterol Sulfate) .... 2 Inh Q4h As Needed Shortness of Breath 10)  Welchol 625 Mg  Tabs (Colesevelam Hcl) .... 1/2 Tablet By Mouth Once Daily Diarrhea 11)  Singulair 10 Mg  Tabs (Montelukast Sodium) .... Take 1 By Mouth Qd 12)  Premarin 0.9 Mg  Tabs (Estrogens Conjugated) .Marland Kitchen.. 1 By Mouth Qd 13)  Vistaril 50 Mg  Caps (Hydroxyzine Pamoate) .Marland Kitchen.. 1-2  By Mouth At Bedtime As Needed Insomnia 14)  Align  Caps (Misc Intestinal Flora Regulat) .... Once Daily 15)  Aciphex 20 Mg Tbec (Rabeprazole Sodium) .... Take 1 Tablet By Mouth Once A Day 16)  Onetouch Ultra Test  Strp (Glucose Blood) .... Use Once To Twice A Day As Directed 17)  Levbid 0.375 Mg Xr12h-Tab (Hyoscyamine  Sulfate) .... Take One By Mouth Every 12 Hours As Needed For Abd. Pain. 18)  Restasis 0.05 % Emul (Cyclosporine) .Marland Kitchen.. 1 Drop Each Eye Once Daily 19)  Duoneb 0.5-2.5 (3) Mg/60ml Soln (Ipratropium-Albuterol) .Marland Kitchen.. 1 Via Hhn Qid Prn 20)  Dulera 100-5 Mcg/act Aero (Mometasone Furo-Formoterol Fum) .... 2 Puffs Bid 21)  Cortisone Shot .... Q 3 Months 22)  Promethazine-Codeine 6.25-10 Mg/48ml Syrp (Promethazine-Codeine) .... 5-10 Ml By Mouth Q Id As Needed Cough 23)  Ketoconazole 2 % Crea (Ketoconazole) .... Use Bid 24)  Levaquin 500 Mg Tabs (Levofloxacin) .Marland Kitchen.. 1 By Mouth Qd 25)  Meperidine Hcl 50 Mg Tabs (Meperidine Hcl) .Marland Kitchen.. 1 By Mouth Three Times A Day As Needed Severe Pain  Allergies: 1)  ! Avelox (Moxifloxacin Hcl) 2)  ! Topamax (Topiramate) 3)  ! Neomycin-Bacitracin Zn-Polymyx (Neomycin-Bacitracin Zn-Polymyx) 4)  ! Toradol 5)  ! * Dilaudid 6)  ! Lyrica (Pregabalin) 7)  Actos (Pioglitazone Hcl) 8)  Augmentin (Amoxicillin-Pot Clavulanate)  Past History:  Social History: Last updated: 04/16/2008 Occupation: RN disabled Married Current Smoker Regular exercise-no Alcohol Use - no Daily Caffeine Use  Past Medical History: Reviewed history from 01/19/2009 and no changes required. Asthma COPD Depression GERD  Dr Juanda Chance Migraines Menopause IBS FMS Possible MRSA-  skin (by verbal report from pt) Diabetes mellitus, type II Peripheral neuropathy Gastroparesis  Review of Systems  The patient denies fever, dyspnea on exertion, and abdominal pain.    Physical Exam  General:  NAD, not  pale,  less sweaty Nose:  External nasal examination shows no deformity or inflammation. Nasal mucosa are pink and moist without lesions or exudates. Mouth:  No erythematous throat mucosa and intranasal erythema.  Neck:  supple, full ROM, no masses, no thyromegaly, no thyroid nodules or tenderness, no JVD, normal carotid upstroke, no carotid bruits, and no cervical lymphadenopathy.   Lungs:  No  wheezes Heart:  RRR Abdomen:  R groin is a little tender; no masses, no rebound tenderness, no inguinal hernia, no hepatomegaly, and no splenomegaly.   Msk:  Lumbar-sacral spine is tender to palpation over paraspinal muscles and painfull with the ROM  Extremities:  No clubbing, cyanosis, edema, or deformity noted with normal full range of motion of all joints.   Neurologic:  No cranial nerve deficits noted. Station and gait are normal. Plantar reflexes are down-going bilaterally. DTRs are symmetrical throughout. Sensory, motor and coordinative functions appear intact. Skin:  turgor normal, color normal,  no ecchymoses, no petechiae, no purpura, no ulcerations, and no edema.  Intertrigo present Psych:  Cognition and judgment appear intact. Alert and cooperative with normal attention span and concentration. No apparent delusions, illusions, hallucinationsgood eye contact, not suicidal, subdued, and tearful.     Impression & Recommendations:  Problem # 1:  ABDOMINAL TENDERNESS, LEFT LOWER QUADRANT (ICD-789.64) Assessment Unchanged Empiric Acyclovir D/c Levaquin CT report reviewed The labs were reviewed with the patient.   Problem # 2:  NAUSEA (ICD-787.02) Assessment: Improved  Problem # 3:  SWEATING (ICD-780.8) Assessment: Unchanged  Complete Medication List: 1)  Metformin Hcl 500 Mg Tb24 (Metformin hcl) .Marland Kitchen.. 1 by mouth bid 2)  Vicodin Hp 10-660 Mg Tabs (Hydrocodone-acetaminophen) .Marland Kitchen.. 1 po qid as needed 3)  Valium 5 Mg Tabs (Diazepam) .Marland Kitchen.. 1 po bid as needed 4)  Zanaflex 4 Mg Tabs (Tizanidine hcl) .... Take 1 tablet by mouth three times a day 5)  Promethazine Hcl 25 Mg Tabs (Promethazine hcl) .Marland Kitchen.. 1 or 1/2  q 6h as needed nausea 6)  Furosemide 20 Mg Tabs (Furosemide) .Marland Kitchen.. 1 by mouth once daily prn 7)  Cymbalta 60 Mg Cpep (Duloxetine hcl) .Marland Kitchen.. 1 tablet by mouth once daily 8)  Vitamin D3 1000 Unit Tabs (Cholecalciferol) .... 2000 international units once daily 9)  Proair Hfa 108 (90  Base) Mcg/act Aers (Albuterol sulfate) .... 2 inh q4h as needed shortness of breath 10)  Welchol 625 Mg Tabs (Colesevelam hcl) .... 1/2 tablet by mouth once daily diarrhea 11)  Singulair 10 Mg Tabs (Montelukast sodium) .... Take 1 by mouth qd 12)  Premarin 0.9 Mg Tabs (Estrogens conjugated) .Marland Kitchen.. 1 by mouth qd 13)  Vistaril 50 Mg Caps (Hydroxyzine pamoate) .Marland Kitchen.. 1-2  by mouth at bedtime as needed insomnia 14)  Align Caps (Misc intestinal flora regulat) .... Once daily 15)  Aciphex 20 Mg Tbec (Rabeprazole sodium) .... Take 1 tablet by mouth once a day 16)  Onetouch Ultra Test Strp (Glucose blood) .... Use once to twice a day as directed 17)  Levbid 0.375 Mg Xr12h-tab (Hyoscyamine sulfate) .... Take one by mouth every 12 hours as needed for abd. pain. 18)  Restasis 0.05 % Emul (Cyclosporine) .Marland Kitchen.. 1 drop each eye once daily 19)  Duoneb 0.5-2.5 (3)  Mg/74ml Soln (Ipratropium-albuterol) .Marland Kitchen.. 1 via hhn qid prn 20)  Dulera 100-5 Mcg/act Aero (Mometasone furo-formoterol fum) .... 2 puffs bid 21)  Cortisone Shot  .... Q 3 months 22)  Promethazine-codeine 6.25-10 Mg/74ml Syrp (Promethazine-codeine) .... 5-10 ml by mouth q id as needed cough 23)  Ketoconazole 2 % Crea (Ketoconazole) .... Use bid 24)  Levaquin 500 Mg Tabs (Levofloxacin) .Marland Kitchen.. 1 by mouth qd 25)  Meperidine Hcl 50 Mg Tabs (Meperidine hcl) .Marland Kitchen.. 1 by mouth three times a day as needed severe pain 26)  Acyclovir 800 Mg Tabs (Acyclovir) .Marland Kitchen.. 1 by mouth 5 times a day if rash  Patient Instructions: 1)  Please schedule a follow-up appointment in 3 months. 2)  BMP prior to visit, ICD-9: 3)  Hepatic Panel prior to visit, ICD-9: 250.00 4)  TSH prior to visit, ICD-9: 5)  HbgA1C prior to visit, ICD-9: Prescriptions: ACYCLOVIR 800 MG TABS (ACYCLOVIR) 1 by mouth 5 times a day if rash  #35 x 1   Entered and Authorized by:   Tresa Garter MD   Signed by:   Tresa Garter MD on 11/24/2009   Method used:   Print then Give to Patient   RxID:    [id number]

## 2010-10-25 NOTE — Progress Notes (Signed)
Summary: FYI  Phone Note Outgoing Call   Summary of Call: Pl let her know -I have reviewed her sleep study from 2002 & she did have moderate obstructive sleep apnea. She has gained wt since & this may be the cause of her non refreshing sleep. Can pursue sleep study - in lab or portable if she is agreeable Initial call taken by: Comer Locket. Vassie Loll MD,  March 11, 2010 2:43 PM  Follow-up for Phone Call        Pt informed of RA recs, pt states due to cost and insurance she is unable to pursue sleep study at this time. Zackery Barefoot CMA  March 11, 2010 3:13 PM

## 2010-10-25 NOTE — Progress Notes (Signed)
Summary: abd pain  Phone Note Call from Patient Call back at Home Phone [phone]   Caller: Patient Call For: Dr. Juanda Chance Reason for Call: Talk to Nurse Summary of Call: persistent abd pain... not sure what to do next... would also like to discuss "leg biopsies" Initial call taken by: Vallarie Mare,  August 12, 2010 10:39 AM  Follow-up for Phone Call        Patient calling to report results from biopsies taken from her ankles by Dr. Cheryll Cockayne at the foot center. 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 Discussed with patient that this is associated with diabetes. Patient wanted to let [name], PA know about the biopsy results and see if this could contribute to her continued Gi issues. Patient reports she had severe abdominal pain and nausea yesterday but is better today. States she is staying on the gastroparesis diet and is taking Nexium. She is not taking her Align daily because it "constipates me." Please, advise. Follow-up by: Jesse Fall RN,  August 12, 2010 11:16 AM  Additional Follow-up for Phone Call Additional follow up Details #1::        OK, STAY OFF ALIGN, SHE MAY HAVE SOME VISCERAL NEUROPATHY-MANAGEMENT  SAME AS FOR PERIPHERAL, NOTHING DIFFERENT TO DO FROM OUR STANDPOINT AT THIS TIME. Additional Follow-up by: Peterson Ao,  August 12, 2010 11:59 AM    Additional Follow-up for Phone Call Additional follow up Details #2::    Patient notified of [name], [place] recommendation. Follow-up by: Jesse Fall RN,  August 12, 2010 1:16 PM

## 2010-10-25 NOTE — Progress Notes (Signed)
Summary: PA-Pristiq  Phone Note From Pharmacy   Summary of Call:  [facility] @ [phone], PA-Pristiq is not covered by insurance, insurance will Cymbalta, Bupropion XL or Bupropion SR. [name]  July 28, 2010 3:21 PM   Follow-up for Phone Call        Ask pt pls if she tried Wellbutrin Follow-up by: Tresa Garter MD,  July 29, 2010 12:13 AM  Additional Follow-up for Phone Call Additional follow up Details #1::        left mess to call office back. .............Marland KitchenLamar Sprinkles, CMA  July 29, 2010 11:59 AM   Pt has tried welbutrin, effexor, lexapro, zoloft, celexa, seroquel and cymbalta. Pt also has new insurance and will run thru at pharm w/this new policy. It may still need PA, if this is the case, we will be able to get med approved given what she has tried and failed.   Additional Follow-up by: Lamar Sprinkles, CMA,  July 29, 2010 2:13 PM    Prescriptions: PRISTIQ 50 MG XR24H-TAB (DESVENLAFAXINE SUCCINATE) 1 by mouth qd  #30 x 6   Entered by:   Lamar Sprinkles, CMA   Authorized by:   Tresa Garter MD   Signed by:   Lamar Sprinkles, CMA on 07/29/2010   Method used:   [facility]  [address] [id number]       [address]       [place], [place]  [address]       Ph: [phone] or [phone]       Fax: [phone]   RxID:   [id number]

## 2010-10-26 ENCOUNTER — Encounter: Payer: Self-pay | Admitting: Gastroenterology

## 2010-10-27 NOTE — Progress Notes (Signed)
  Phone Note Outgoing Call   Call placed by: Jesse Fall, RN Call placed to: Radiology Summary of Call: Called radiology to see if patient came for CT abd/pelvis. Patient had r/s to 10/14/10. Initial call taken by: Jesse Fall RN,  October 12, 2010 11:06 AM

## 2010-10-27 NOTE — Medication Information (Signed)
Summary: Nexium/[facility]  Nexium/[facility]   Imported By: Sherian Rein 09/13/2010 09:25:44  _____________________________________________________________________  External Attachment:    Type:   Image     Comment:   External Document

## 2010-10-27 NOTE — Progress Notes (Signed)
Summary: Triage  Phone Note Call from Patient Call back at Home Phone [phone]   Caller: Patient Call For: dR. Lyrick Worland Reason for Call: Talk to Nurse Summary of Call: Pt wants to be seen sooner, she is having worsening abdominal pain nausea and diarrhea Initial call taken by: [place] Johnson,  September 27, 2010 10:03 AM  Follow-up for Phone Call        Patient calling to report that since last Friday she has had left upper quad. pain, nausea and diarrhea. Reports diarrhea 3-4 times/day. She is eating gastroparesis diet but continues to have severe nausea. Using Levsin, Nexium, Valium, and Phenergan without relief. States " I am so desperate that I have tried wearing motion bands for nausea." She did try Vicodan for pain but stopped it because she thought it was making her nausea worse. She is under a lot of stress at this time. Denies fever. She states she can "tolerate the pain but the nausea is my main problem." She is wondering if she can try Zofran for nausea. Patient does have an appointment scheduled with Dr. Juanda Chance on 10/05/10. Please, advise.  Follow-up by: Jesse Fall RN,  September 27, 2010 10:55 AM  Additional Follow-up for Phone Call Additional follow up Details #1::        Zofran 4 mg, #20, 1 by mouth q 6hrs  as needed nausea. Check CBC,C-met. Try  for her to see an extender if she does not want to wait till 10/05/2010. Additional Follow-up by: Hart Carwin MD,  September 27, 2010 12:55 PM    Additional Follow-up for Phone Call Additional follow up Details #2::    Spoke with patient and gave her Dr. Regino Schultze recommendations. She cannot come today for labs but will come tomorrow AM. Rx sent to pharmacy. Scheduled patient with [name], PA on 09/29/10 at 11 AM per patient request. Follow-up by: Jesse Fall RN,  September 27, 2010 1:33 PM  New/Updated Medications: ONDANSETRON HCL 4 MG TABS (ONDANSETRON HCL) Take one by mouth every 6 hours as needed  nausea Prescriptions: ONDANSETRON HCL 4 MG TABS (ONDANSETRON HCL) Take one by mouth every 6 hours as needed nausea  #20 x 0   Entered by:   Jesse Fall RN   Authorized by:   Hart Carwin MD   Signed by:   Jesse Fall RN on 09/27/2010   Method used:   [id number]       [address]       [place], [place]  [address]       Ph: [phone] or [phone]       Fax: [phone]   RxID:   [id number]

## 2010-10-27 NOTE — Medication Information (Signed)
Summary: Pristiq approved/[facility]  Pristiq approved/[facility]   Imported By: Sherian Rein 09/09/2010 12:43:43  _____________________________________________________________________  External Attachment:    Type:   Image     Comment:   External Document

## 2010-10-27 NOTE — Progress Notes (Signed)
Summary: REQ FOR RF  Phone Note Call from Patient   Summary of Call: Patient is requesting refill of prometh/codine. Initial call taken by: Lamar Sprinkles, CMA,  October 12, 2010 4:32 PM  Follow-up for Phone Call        ok to ref Follow-up by: Tresa Garter MD,  October 13, 2010 7:51 AM  Additional Follow-up for Phone Call Additional follow up Details #1::        Pt informed  Additional Follow-up by: Lamar Sprinkles, CMA,  October 13, 2010 9:13 AM    Prescriptions: PROMETHAZINE-CODEINE 6.25-10 MG/5ML SYRP (PROMETHAZINE-CODEINE) 5-10 ml by mouth q id as needed cough  #300 ml x 0   Entered by:   Lamar Sprinkles, CMA   Authorized by:   Tresa Garter MD   Signed by:   Lamar Sprinkles, CMA on 10/13/2010   Method used:   Telephoned to ...       [id number]       [address]       [place], [place]  [address]       Ph: [phone] or [phone]       Fax: [phone]   RxID:   [id number]

## 2010-10-27 NOTE — Medication Information (Signed)
Summary: Approved/[facility]  Approved/[facility]   Imported By: Lester Cl[place] 09/20/2010 11:28:56  _____________________________________________________________________  External Attachment:    Type:   Image     Comment:   External Document

## 2010-10-27 NOTE — Progress Notes (Signed)
Summary: Medication  Phone Note Call from Patient Call back at Home Phone [phone]   Caller: Patient Call For: Dr. Juanda Chance Reason for Call: Talk to Nurse Summary of Call: Needs a script for Nexium [facility] to [facility] [address] Initial call taken by: [name],  September 06, 2010 11:35 AM  Follow-up for Phone Call        Amy-patient states that you told her to take Nexium and that a 1 month sample was given to her. I dont see any mention of that and your note says to continue aciphex..... Patient now requesting Nexium...are you okay with a new prescription of Nexium or should she stay on Aciphex? Follow-up by: Lamona Curl CMA Duncan Dull),  September 06, 2010 1:24 PM  Additional Follow-up for Phone Call Additional follow up Details #1::        IF NEXIUM WORKS,MAY STAY ON NEXIUM 40 MG DAILY, SHOULD BE OFF ACIPHEX. THANKS Additional Follow-up by: Peterson Ao,  September 06, 2010 3:35 PM     Appended Document: Medication    Clinical Lists Changes  Medications: Changed medication from ACIPHEX 20 MG TBEC (RABEPRAZOLE SODIUM) Take 1 tablet by mouth once a day to NEXIUM 40 MG CPDR (ESOMEPRAZOLE MAGNESIUM) Take 1 tablet by mouth once a day (pharmacy-please d/c any prescription remaining for aciphex) - Signed Rx of NEXIUM 40 MG CPDR (ESOMEPRAZOLE MAGNESIUM) Take 1 tablet by mouth once a day (pharmacy-please d/c any prescription remaining for aciphex);  #30 x 1;  Signed;  Entered by: Lamona Curl CMA (AAMA);  Authorized by: Sammuel Cooper PA-c;  Method used: [id number] [address] [place], [place]  [address], Ph: [phone] or [phone], Fax: [phone]    Prescriptions: NEXIUM 40 MG CPDR (ESOMEPRAZOLE MAGNESIUM) Take 1 tablet by mouth once a day (pharmacy-please d/c any prescription remaining for aciphex)  #30 x 1   Entered by:   Lamona Curl CMA (AAMA)   Authorized by:   Sammuel Cooper PA-c   Signed by:    Lamona Curl CMA (AAMA) on 09/06/2010   Method used:   [facility]        [facility] [id number]       [address]       [place], [place]  [address]       Ph: [phone] or [phone]       Fax: [phone]   RxID:   [id number]    Appended Document: Medication Patient now states that she would like Amy to know that she is no longer having abdominal pain. She is still having nausea. She has been taking Nexium 40 mg every morning as soon as she gets up and has been taking 2 phenergan daily. She states that she knows her scans have been normal but wants to know if there is anything else to be done. I have reiterated to her that she should continue to follow antireflux measures and continue taking Nexium.....  Appended Document: Medication SHE SHOULD FOLLOW UP WITH DR. Juanda Chance  Appended Document: Medication Patient has been scheduled for follow up appointment with Dr Juanda Chance on 10/05/10. She has been advised to call back sooner should symptoms worsen or change.

## 2010-10-27 NOTE — Assessment & Plan Note (Signed)
Summary: nausea/Regina   History of Present Illness Primary GI MD: [name] MD Primary Provider: Sonda Primes, MD Chief Complaint: Several more frequent episodes of N/V with bloating and LUQ intermittant sharp abd pains. Pt states the last episode was last Friday that has lasted until now but the worse day was on Monday. Pt states she has no appetite and ate crackers and sipped on Gatorade. The pain medicine did not help but the Zofran did help with the nausea.Pt does have constant diarrhea, with 3-4 BM's every day. History of Present Illness:   PLEASANT 54 YO FEMALE, FORMER EMPLOYEE OF [employer] WHO COMES IN TODAY WITH RECURRENT EPISODES OF UPPER ABDOMINAL PAIN, NAUSEA,DRY HEAVES AND DIARREA. SHE SAYS SHE IS HAVING THESE EPISODES ABOUT EVERY 2-3 WEEKS NOW. THEY ARE UNPREDICTABLE, AND AT TIMES SHE IS INCONTINENT OF STOOL. HER LAST EPISODE STARTED ON 12/30 WITH A FULL BLOATED FEELING ,THEN PAIN IN THE EPIGASTRIUM, AND LUQ FOLLOWED BY NAUSEA, AND DRY HEAVES. SHE IS UNABLE TO VOMIT BECAUSE OF PRIOR FUNDOPLICATION. SHE FEELS  CLAMMY THEN GETS  URGENCY FOR BM'S AND HAS DIARRHEA. SHE REMAINED ILL, NAUSEATED OFF AND ON OVER THE NEXT 3-4 DAYS. SHE WAS CALLED IN ZOFRAN WHICH DOES HELP BUT GIVES HER A HEADACHE.SHE HAS BEEN FEELING BETTER THE PAST 2 DAYS.  SHE HAD BEEN ON ANTISPASMOTICS WITHOUT ANY BENEFIT.  GE SCAN ON 11/11 WAS NORMAL. SHE IS DIABETIC ,HAS FIBROMYALGIA, AND PERIPHERAL NEUROPATHY.NO NEW MEDS OR ABX, NO NSAIDS. LABS ON 09/28/10 UNREMARKABLE INCLUDING CMET-EXCEPT GLUCOSE 131.  SHE IS S/P MULTIPLE ABDOMINAL SURGERIES, INCLUDING CHOLECYSTECTOMY.   GI Review of Systems    Reports abdominal pain, belching, bloating, loss of appetite, nausea, and  vomiting.     Location of  Abdominal pain: LUQ.    Denies acid reflux, chest pain, dysphagia with liquids, dysphagia with solids, heartburn, vomiting blood, weight loss, and  weight gain.      Reports diarrhea.     Denies anal fissure, black tarry  stools, change in bowel habit, constipation, diverticulosis, fecal incontinence, heme positive stool, hemorrhoids, irritable bowel syndrome, jaundice, light color stool, liver problems, rectal bleeding, and  rectal pain.    Current Medications (verified): 1)  Metformin Hcl 500 Mg  Tb24 (Metformin Hcl) .... Take 1 Tablet By Mouth Two Times A Day 2)  Vicodin Hp 10-660 Mg  Tabs (Hydrocodone-Acetaminophen) .... Take 1 Tablet By Mouth Once A Day and As Needed 3)  Valium 5 Mg  Tabs (Diazepam) .... Take 2 Tab By Mouth At Bedtime and As Needed 4)  Zanaflex 4 Mg  Tabs (Tizanidine Hcl) .... Take 1 Tablet By Mouth Three Times A Day 5)  Promethazine Hcl 25 Mg  Tabs (Promethazine Hcl) .Marland Kitchen.. 1 or 1/2  Q 6h As Needed Nausea 6)  Furosemide 20 Mg  Tabs (Furosemide) .Marland Kitchen.. 1 By Mouth Once Daily Prn 7)  Vitamin D3 1000 Unit  Tabs (Cholecalciferol) .... 2000 International Units Once Daily 8)  Proair Hfa 108 (90 Base) Mcg/act  Aers (Albuterol Sulfate) .... 2 Inh Q4h As Needed Shortness of Breath 9)  Zyrtec Hives Relief 10 Mg Tabs (Cetirizine Hcl) .... Take 1 Tablet By Mouth Once A Day 10)  Premarin 0.9 Mg  Tabs (Estrogens Conjugated) .Marland Kitchen.. 1 By Mouth Qd 11)  Vistaril 50 Mg  Caps (Hydroxyzine Pamoate) .... 2  By Mouth At Bedtime As Needed Insomnia 12)  Align  Caps (Misc Intestinal Flora Regulat) .... Once Daily 13)  Nexium 40 Mg Cpdr (Esomeprazole Magnesium) .... Take 1  Tablet By Mouth Once A Day (Pharmacy-Please D/c Any Prescription Remaining For Aciphex) 14)  Onetouch Ultra Test  Strp (Glucose Blood) .... Use Once To Twice A Day As Directed 15)  Restasis 0.05 % Emul (Cyclosporine) .Marland Kitchen.. 1 Drop Each Eye Once Daily 16)  Duoneb 0.5-2.5 (3) Mg/68ml Soln (Ipratropium-Albuterol) .Marland Kitchen.. 1 Via Hhn Qid Prn 17)  Cortisone Shot .... Q 3 Months 18)  Promethazine-Codeine 6.25-10 Mg/64ml Syrp (Promethazine-Codeine) .... 5-10 Ml By Mouth Q Id As Needed Cough 19)  Triamcinolone Acetonide 0.1 % Crea (Triamcinolone Acetonide) .... Three  Times A Day As Needed Rash 20)  Pristiq 50 Mg Xr24h-Tab (Desvenlafaxine Succinate) .Marland Kitchen.. 1 By Mouth Qd 21)  Levsin/sl 0.125 Mg Subl (Hyoscyamine Sulfate) .Marland Kitchen.. 1 Sl Qid  Prn 22)  Premarin 0.625 Mg/gm Crea (Estrogens, Conjugated) .... Use 1 G Pv At Bedtime X 3 D, Then Every Other Day X 6 D, Then Weekly 23)  Ondansetron Hcl 4 Mg Tabs (Ondansetron Hcl) .... Take One By Mouth Every 6 Hours As Needed Nausea  Allergies (verified): 1)  ! Avelox (Moxifloxacin Hcl) 2)  ! Topamax (Topiramate) 3)  ! Neomycin-Bacitracin Zn-Polymyx (Neomycin-Bacitracin Zn-Polymyx) 4)  ! Toradol 5)  ! * Dilaudid 6)  ! Lyrica (Pregabalin) 7)  ! * Ivp Dye 8)  Actos (Pioglitazone Hcl) 9)  Augmentin (Amoxicillin-Pot Clavulanate) 10)  Cymbalta 11)  Seroquel (Quetiapine Fumarate)  Past History:  Past Medical History: Asthma COPD Depression GERD  Dr Juanda Chance Migraines Menopause IBS  Possible MRSA-  skin (by verbal report from pt) Diabetes mellitus, type II Peripheral neuropathy  Fibromyalgia  Past Surgical History: Hysterectomy  complete Sinus surgery  x 2  colonoscopy 2003/EGD 2009-GASTRITIS Nissen Fundoplication  1999 Surgery for Left heel spur Appendectomy VENTRAL HERNIA REPAIR WITH MESH Cholecystectomy  Family History: Reviewed history from 04/16/2008 and no changes required. Family History of Arthritis Family History Diabetes 1st degree relative Mother and Father MGrandparents Family History Hypertension B MI 4 Family History of Heart Disease: Maternal Grandmother brother died MI, Father-CHF Family History of Breast Cancer: Maternal Grandmother Family History of Colon Cancer:MGF PGF Family History of Colon Polyps:father PGF and MGF Lung Cancer-mother died age 19 Family History of Uterine Cancer:Great grandmother Family History of Esophageal Cancer:father  Social History: Reviewed history from 05/25/2010 and no changes required. Occupation: CMA disabled Married, lives with husband and  grandchild Children: yes 3 Regular exercise-no Alcohol Use - no Daily Caffeine Use Patient states former smoker. (stopped April 2011) Former Smoker 4/11  Review of Systems  The patient denies allergy/sinus, anemia, anxiety-new, arthritis/joint pain, back pain, blood in urine, breast changes/lumps, change in vision, confusion, cough, coughing up blood, depression-new, fainting, fatigue, fever, headaches-new, hearing problems, heart murmur, heart rhythm changes, itching, menstrual pain, muscle pains/cramps, night sweats, nosebleeds, pregnancy symptoms, shortness of breath, skin rash, sleeping problems, sore throat, swelling of feet/legs, swollen lymph glands, thirst - excessive , urination - excessive , urination changes/pain, urine leakage, vision changes, and voice change.         SEE HPI  Vital Signs:  Patient profile:   55 year old female Height:      65 inches Weight:      249.38 pounds BMI:     41.65 Pulse rate:   76 / minute Pulse rhythm:   regular BP sitting:   118 / 76  (left arm) Cuff size:   large  Vitals Entered By: Christie Nottingham CMA Duncan Dull) (September 29, 2010 11:14 AM)  Physical Exam  General:  Well developed, ,  no acute distress.obese.   Head:  Normocephalic and atraumatic. Eyes:  PERRLA, no icterus. Lungs:  Clear throughout to auscultation. Heart:  Regular rate and rhythm; no murmurs, rubs,  or bruits. Abdomen:  OBESE, SOFT, MILD TENDERNESS EPIGASTRIUM,LUQ, NO GUARDING OR REBOUND, NO MASS OR HSM,BS+ Rectal:  NOT DONE Extremities:  No clubbing, cyanosis, edema or deformities noted. Neurologic:  Alert and  oriented x4;  grossly normal neurologically. Psych:  Alert and cooperative. Normal mood and affect.   Impression & Recommendations:  Problem # 1:  ABDOMINAL PAIN-EPIGASTRIC (ICD-789.06) Assessment Deteriorated 54 YO FEMALE WITH RECURRENT EPIGASTRIC /LUQ PAIN WITH NAUSEA,DRY HEAVES, AND DIARRHEA. SXS MAY ALL BE IBS-POSSIBLE DIABETIC VISCERAL NEUROPATHY. WE  TREATED HER FOR BACTERIAL OVERGROWTH IN NOVEMBER  2011 WITH NO IMPROVEMENT. R/O INTERMITTENT LOW GRADE OBSTRUCTION.  SCHEDULE FOR CT ABD/PELVIS  WITH CONTAST-WILL NEED PREMEDICATED. CONTINUE NEXIUM 40 MG DAILY IN AM ANTISPASMOTICS IN EFFECTIVE IN THE PAST REFILL ZOFRAN 4 MG Q 6 HOURS AS NEEDED. SHE HAS A FOLLOW UP SCHEDULED WITH DR. Juanda Chance ON 10/05/10 WHICH SHE WILL KEEP-CONSIDER EGD IF CT IS NEGATIVE.  Problem # 2:  OBSTRUCTIVE SLEEP APNEA (ICD-780.57) Assessment: Comment Only  Problem # 3:  FIBROMYALGIA (ICD-729.1) Assessment: Comment Only  Problem # 4:  IRRITABLE BOWEL SYNDROME (ICD-564.1) Assessment: Unchanged  Problem # 5:  COPD (ICD-496) Assessment: Comment Only  Problem # 6:  DIABETES MELLITUS, TYPE II (ICD-250.00) Assessment: Comment Only  Problem # 7:  OBESITY (ICD-278.00) Assessment: Comment Only  Problem # 8:  BARRETT'S ESOPHAGUS, HX OF (ICD-V12.79) Assessment: Comment Only BX NEGATIVE  FOR BARRETTS 2009- PT S/P REMOTE NISSEN / FUNDOPLICATION  Other Orders: CT Abdomen/Pelvis w & w/o Contrast (CT A/P w&w/o Cont)  Patient Instructions: 1)  We scheduled the CT scan ABD & PELVIS for 10-03-2010 @ [facility]. 2)  Directions provided. 3)  Go to [facility] Radiology department and pick up premedication perscription and contrast today.  4)  Hold the Metformin Mon 10-03-2010 AM. 5)  We sent the Zofran RX for refill.  6)  Keep the follow up appointment with Dr. Juanda Chance on 10-05-2010.  7)  Copy sent to : Sonda Primes, MD 8)  The medication list was reviewed and reconciled.  All changed / newly prescribed medications were explained.  A complete medication list was provided to the patient / caregiver. Prescriptions: ONDANSETRON HCL 4 MG TABS (ONDANSETRON HCL) Take one by mouth every 6 hours as needed nausea  #30 x 2   Entered by:   Lowry Ram NCMA   Authorized by:   Sammuel Cooper PA-c   Signed by:   Lowry Ram NCMA on 09/29/2010   Method used:    [facility]  [address] [id number]       [address]       [place], [place]  [address]       Ph: [phone] or [phone]       Fax: [phone]   RxID:   [id number]

## 2010-10-27 NOTE — Progress Notes (Addendum)
Summary: FYI  Phone Note Call from Patient Call back at Home Phone [phone]   Caller: Patient Call For: Dr Juanda Chance Reason for Call: Talk to Nurse Summary of Call: Patient was scheduled for a ct today but was unable to make it because she was sick, pt rescheduled it for Friday and she also rescheduled hr office visit for Monday Jan. 16 @ 2:15pm. Initial call taken by: Tawni Levy,  October 03, 2010 10:42 AM  Follow-up for Phone Call        Spoke with patient. She is sick today and her dad had an MI on Saturday. She has to make some decisions about her dad and needed to do this so she could not keep the CT scan appt.  She has r/s it to Friday and has f/u with Dr. Juanda Chance on 10/10/10. Follow-up by: Hart Carwin MD,  October 03, 2010 8:28 PM     Appended Document: FYI Patient no showed her CT on 10/14/10 per Radiology. She has an upcoming appointment with Dr Juanda Chance on 11/16/10. We will discuss with her at that time.  Appended Document: FYI reviewed, several cancellations recently., would not refill any meds till her appointment.

## 2010-11-07 ENCOUNTER — Ambulatory Visit: Payer: Self-pay | Admitting: Internal Medicine

## 2010-11-16 ENCOUNTER — Ambulatory Visit: Payer: Self-pay | Admitting: Internal Medicine

## 2010-11-16 DIAGNOSIS — R69 Illness, unspecified: Secondary | ICD-10-CM

## 2010-11-23 ENCOUNTER — Encounter: Payer: Self-pay | Admitting: Internal Medicine

## 2010-11-23 ENCOUNTER — Ambulatory Visit (INDEPENDENT_AMBULATORY_CARE_PROVIDER_SITE_OTHER): Payer: Medicare Other | Admitting: Internal Medicine

## 2010-11-23 DIAGNOSIS — G43909 Migraine, unspecified, not intractable, without status migrainosus: Secondary | ICD-10-CM | POA: Insufficient documentation

## 2010-11-23 DIAGNOSIS — J45909 Unspecified asthma, uncomplicated: Secondary | ICD-10-CM

## 2010-11-23 DIAGNOSIS — E119 Type 2 diabetes mellitus without complications: Secondary | ICD-10-CM

## 2010-11-23 DIAGNOSIS — E669 Obesity, unspecified: Secondary | ICD-10-CM

## 2010-11-28 ENCOUNTER — Telehealth: Payer: Self-pay | Admitting: Internal Medicine

## 2010-11-29 ENCOUNTER — Other Ambulatory Visit: Payer: Medicare Other

## 2010-12-02 ENCOUNTER — Telehealth: Payer: Self-pay | Admitting: Internal Medicine

## 2010-12-02 ENCOUNTER — Ambulatory Visit (INDEPENDENT_AMBULATORY_CARE_PROVIDER_SITE_OTHER): Payer: BC Managed Care – PPO | Admitting: Psychology

## 2010-12-02 ENCOUNTER — Other Ambulatory Visit: Payer: BC Managed Care – PPO

## 2010-12-02 ENCOUNTER — Encounter (INDEPENDENT_AMBULATORY_CARE_PROVIDER_SITE_OTHER): Payer: Self-pay | Admitting: *Deleted

## 2010-12-02 ENCOUNTER — Other Ambulatory Visit: Payer: Self-pay | Admitting: Internal Medicine

## 2010-12-02 DIAGNOSIS — N3000 Acute cystitis without hematuria: Secondary | ICD-10-CM

## 2010-12-02 DIAGNOSIS — F331 Major depressive disorder, recurrent, moderate: Secondary | ICD-10-CM

## 2010-12-02 LAB — URINALYSIS
Hgb urine dipstick: NEGATIVE
Leukocytes, UA: NEGATIVE
Specific Gravity, Urine: >=1.03 (ref 1.000–1.030)
Urobilinogen, UA: 0.2 (ref 0.0–1.0)

## 2010-12-06 NOTE — Assessment & Plan Note (Signed)
Summary: 3 mos f/u apt   Vital Signs:  Patient profile:   55 year old female Height:      65 inches Weight:      249 pounds BMI:     41.59 Temp:     98.4 degrees F oral Pulse rate:   84 / minute Pulse rhythm:   regular Resp:     16 per minute BP sitting:   114 / 90  (left arm) Cuff size:   large  Vitals Entered By: Lanier Prude, CMA(AAMA) (November 23, 2010 11:11 AM) CC: 3 mo f/u  Is Patient Diabetic? No Comments pt is not taking Promethazine   Primary Care Provider:  Sonda Primes, MD  CC:  3 mo f/u .  History of Present Illness: The patient presents for a follow up of back pain, anxiety, depression and headaches and DM C/o feet swelling and sensory deficit, cramps in legs  Current Medications (verified): 1)  Metformin Hcl 500 Mg  Tb24 (Metformin Hcl) .... Take 1 Tablet By Mouth Two Times A Day 2)  Vicodin Hp 10-660 Mg  Tabs (Hydrocodone-Acetaminophen) .... Take 1 Tablet By Mouth Once A Day and As Needed 3)  Valium 5 Mg  Tabs (Diazepam) .... Take 2 Tab By Mouth At Bedtime and As Needed 4)  Zanaflex 4 Mg  Tabs (Tizanidine Hcl) .... Take 1 Tablet By Mouth Three Times A Day 5)  Promethazine Hcl 25 Mg  Tabs (Promethazine Hcl) .Marland Kitchen.. 1 or 1/2  Q 6h As Needed Nausea 6)  Furosemide 20 Mg  Tabs (Furosemide) .Marland Kitchen.. 1 By Mouth Once Daily Prn 7)  Vitamin D3 1000 Unit  Tabs (Cholecalciferol) .... 2000 International Units Once Daily 8)  Proair Hfa 108 (90 Base) Mcg/act  Aers (Albuterol Sulfate) .... 2 Inh Q4h As Needed Shortness of Breath 9)  Zyrtec Hives Relief 10 Mg Tabs (Cetirizine Hcl) .... Take 1 Tablet By Mouth Once A Day 10)  Premarin 0.9 Mg  Tabs (Estrogens Conjugated) .Marland Kitchen.. 1 By Mouth Qd 11)  Vistaril 50 Mg  Caps (Hydroxyzine Pamoate) .... 2  By Mouth At Bedtime As Needed Insomnia 12)  Align  Caps (Misc Intestinal Flora Regulat) .... Once Daily 13)  Nexium 40 Mg Cpdr (Esomeprazole Magnesium) .... Take 1 Tablet By Mouth Once A Day (Pharmacy-Please D/c Any Prescription Remaining  For Aciphex) 14)  Onetouch Ultra Test  Strp (Glucose Blood) .... Use Once To Twice A Day As Directed 15)  Restasis 0.05 % Emul (Cyclosporine) .Marland Kitchen.. 1 Drop Each Eye Once Daily 16)  Duoneb 0.5-2.5 (3) Mg/57ml Soln (Ipratropium-Albuterol) .Marland Kitchen.. 1 Via Hhn Qid Prn 17)  Cortisone Shot .... Q 3 Months 18)  Promethazine-Codeine 6.25-10 Mg/82ml Syrp (Promethazine-Codeine) .... 5-10 Ml By Mouth Q Id As Needed Cough 19)  Triamcinolone Acetonide 0.1 % Crea (Triamcinolone Acetonide) .... Three Times A Day As Needed Rash 20)  Pristiq 50 Mg Xr24h-Tab (Desvenlafaxine Succinate) .Marland Kitchen.. 1 By Mouth Qd 21)  Levsin/sl 0.125 Mg Subl (Hyoscyamine Sulfate) .Marland Kitchen.. 1 Sl Qid  Prn 22)  Premarin 0.625 Mg/gm Crea (Estrogens, Conjugated) .... Use 1 G Pv At Bedtime X 3 D, Then Every Other Day X 6 D, Then Weekly 23)  Ondansetron Hcl 4 Mg Tabs (Ondansetron Hcl) .... Take One By Mouth Every 6 Hours As Needed Nausea  Allergies (verified): 1)  ! Avelox (Moxifloxacin Hcl) 2)  ! Topamax (Topiramate) 3)  ! Neomycin-Bacitracin Zn-Polymyx (Neomycin-Bacitracin Zn-Polymyx) 4)  ! Toradol 5)  ! * Dilaudid 6)  ! Lyrica (Pregabalin) 7)  ! *  Ivp Dye 8)  Actos (Pioglitazone Hcl) 9)  Augmentin (Amoxicillin-Pot Clavulanate) 10)  Cymbalta 11)  Seroquel (Quetiapine Fumarate)  Past History:  Past Surgical History: Last updated: 09/29/2010 Hysterectomy  complete Sinus surgery  x 2  colonoscopy 2003/EGD 2009-GASTRITIS Nissen Fundoplication  1999 Surgery for Left heel spur Appendectomy VENTRAL HERNIA REPAIR WITH MESH Cholecystectomy  Social History: Last updated: 05/25/2010 Occupation: CMA disabled Married, lives with husband and grandchild Children: yes 3 Regular exercise-no Alcohol Use - no Daily Caffeine Use Patient states former smoker. (stopped April 2011) Former Smoker 4/11  Past Medical History: Asthma COPD Depression GERD and gastroparesis   Dr Juanda Chance Migraines Menopause IBS Possible MRSA-  skin (by verbal report  from pt) Diabetes mellitus, type II Peripheral neuropathy LEs Fibromyalgia Obesity  Review of Systems  The patient denies fever, weight loss, weight gain, chest pain, abdominal pain, and melena.    Physical Exam  General:  NAD, not  pale,  less sweaty Eyes:  No corneal or conjunctival inflammation noted. EOMI. Perrla.  Ears:  R ear normal and L ear normal.   Nose:  External nasal examination shows no deformity or inflammation. Nasal mucosa are pink and moist without lesions or exudates. Mouth:  No erythematous throat mucosa and intranasal erythema.  Neck:  supple, full ROM, no masses, no thyromegaly, no thyroid nodules or tenderness, no JVD, normal carotid upstroke, no carotid bruits, and no cervical lymphadenopathy.   Lungs:  No wheezes Heart:  RRR Abdomen:  R groin is a little tender; no masses, no rebound tenderness, no inguinal hernia, no hepatomegaly, and no splenomegaly.   Msk:  Lumbar-sacral spine is tender to palpation over paraspinal muscles and painfull with the ROM   Neurologic:  No cranial nerve deficits noted. Station and gait are normal. Plantar reflexes are down-going bilaterally. DTRs are symmetrical throughout. Sensory, motor and coordinative functions appear intact. Skin:  turgor normal, color normal,  no ecchymoses, no petechiae, no purpura, no ulcerations, and no edema.  Intertrigo present Psych:  Oriented X3, not suicidal, and subdued.  depressed affect, labile affect, and tearful.     Impression & Recommendations:  Problem # 1:  FIBROMYALGIA (ICD-729.1) Assessment Unchanged  Her updated medication list for this problem includes:    Vicodin Hp 10-660 Mg Tabs (Hydrocodone-acetaminophen) .Marland Kitchen... Take 1 tablet by mouth once a day and as needed    Zanaflex 4 Mg Tabs (Tizanidine hcl) .Marland Kitchen... Take 1 tablet by mouth three times a day  Problem # 2:  OBESITY (ICD-278.00) Assessment: Unchanged  Problem # 3:  DIABETES MELLITUS, TYPE II (ICD-250.00) Assessment:  Unchanged  Her updated medication list for this problem includes:    Metformin Hcl 500 Mg Tb24 (Metformin hcl) .Marland Kitchen... Take 1 tablet by mouth two times a day  Problem # 4:  DEPRESSION (ICD-311) Assessment: Unchanged  Her updated medication list for this problem includes:    Valium 5 Mg Tabs (Diazepam) .Marland Kitchen... Take 2 tab by mouth at bedtime and as needed    Vistaril 50 Mg Caps (Hydroxyzine pamoate) .Marland Kitchen... 2  by mouth at bedtime as needed insomnia    Pristiq 50 Mg Xr24h-tab (Desvenlafaxine succinate) .Marland Kitchen... 1 by mouth qd  Problem # 5:  MIGRAINE HEADACHE (ICD-346.90) post-BOTOX IMPROVED Assessment: Improved  Her updated medication list for this problem includes:    Vicodin Hp 10-660 Mg Tabs (Hydrocodone-acetaminophen) .Marland Kitchen... Take 1 tablet by mouth once a day and as needed  Complete Medication List: 1)  Metformin Hcl 500 Mg Tb24 (Metformin hcl) .... Take  1 tablet by mouth two times a day 2)  Vicodin Hp 10-660 Mg Tabs (Hydrocodone-acetaminophen) .... Take 1 tablet by mouth once a day and as needed 3)  Valium 5 Mg Tabs (Diazepam) .... Take 2 tab by mouth at bedtime and as needed 4)  Zanaflex 4 Mg Tabs (Tizanidine hcl) .... Take 1 tablet by mouth three times a day 5)  Furosemide 20 Mg Tabs (Furosemide) .Marland Kitchen.. 1 by mouth once daily prn 6)  Vitamin D3 1000 Unit Tabs (Cholecalciferol) .... 2000 international units once daily 7)  Proair Hfa 108 (90 Base) Mcg/act Aers (Albuterol sulfate) .... 2 inh q4h as needed shortness of breath 8)  Zyrtec Hives Relief 10 Mg Tabs (Cetirizine hcl) .... Take 1 tablet by mouth once a day 9)  Premarin 0.9 Mg Tabs (Estrogens conjugated) .Marland Kitchen.. 1 by mouth qd 10)  Vistaril 50 Mg Caps (Hydroxyzine pamoate) .... 2  by mouth at bedtime as needed insomnia 11)  Align Caps (Misc intestinal flora regulat) .... Once daily 12)  Nexium 40 Mg Cpdr (Esomeprazole magnesium) .... Take 1 tablet by mouth once a day (pharmacy-please d/c any prescription remaining for aciphex) 13)  Onetouch  Ultra Test Strp (Glucose blood) .... Use once to twice a day as directed 14)  Restasis 0.05 % Emul (Cyclosporine) .Marland Kitchen.. 1 drop each eye once daily 15)  Duoneb 0.5-2.5 (3) Mg/65ml Soln (Ipratropium-albuterol) .Marland Kitchen.. 1 via hhn qid prn 16)  Triamcinolone Acetonide 0.1 % Crea (Triamcinolone acetonide) .... Three times a day as needed rash 17)  Pristiq 50 Mg Xr24h-tab (Desvenlafaxine succinate) .Marland Kitchen.. 1 by mouth qd 18)  Levsin/sl 0.125 Mg Subl (Hyoscyamine sulfate) .Marland Kitchen.. 1 sl qid  prn 19)  Premarin 0.625 Mg/gm Crea (Estrogens, conjugated) .... Use 1 g pv at bedtime x 3 d, then every other day x 6 d, then weekly 20)  Ondansetron Hcl 4 Mg Tabs (Ondansetron hcl) .... Take one by mouth every 6 hours as needed nausea 21)  Cortisone Shot  .... Q 3 months  Patient Instructions: 1)  Please schedule a follow-up appointment in 3 months. 2)  BMP prior to visit, ICD-9: 3)  Hepatic Panel prior to visit, ICD-9: 4)  HbgA1C prior to visit, ICD-9:250.00   995.20 Prescriptions: METFORMIN HCL 500 MG  TB24 (METFORMIN HCL) Take 1 tablet by mouth two times a day  #180 x 3   Entered and Authorized by:   Tresa Garter MD   Signed by:   Tresa Garter MD on 11/23/2010   Method used:   Faxed to ...       [facility] (mail-order)             , [place]         Ph: [phone]       Fax: [phone]   RxID:   [id number] FUROSEMIDE 20 MG  TABS (FUROSEMIDE) 1 by mouth once daily prn  #90 x 3   Entered and Authorized by:   Tresa Garter MD   Signed by:   Tresa Garter MD on 11/23/2010   Method used:   Faxed to ...       [facility] (mail-order)             , [place]         Ph: [phone]       Fax: [phone]   RxID:   [id number] ONDANSETRON HCL 4 MG TABS (ONDANSETRON HCL) Take one by mouth every 6 hours as needed nausea  #30 x 2  Entered and Authorized by:   Tresa Garter MD   Signed by:   Tresa Garter MD on 11/23/2010   Method used:   Faxed to ...       [facility]  (mail-order)             , [place]         Ph: [phone]       Fax: [phone]   RxID:   [id number] PROAIR HFA 108 (90 BASE) MCG/ACT  AERS (ALBUTEROL SULFATE) 2 inh q4h as needed shortness of breath  #3 x 3   Entered and Authorized by:   Tresa Garter MD   Signed by:   Tresa Garter MD on 11/23/2010   Method used:   Faxed to ...       [facility] (mail-order)             , [place]         Ph: [phone]       Fax: [phone]   RxID:   [id number]    Orders Added: 1)  Est. Patient Level IV [63016]

## 2010-12-06 NOTE — Progress Notes (Signed)
Summary: PLOT PT - UTI?   Phone Note Call from Patient   Summary of Call: Pt c/o another UTI, urinary burning, pain & dark urine. Patient is requesting rx for antibiotic [facility] [address]. Please advise.  Initial call taken by: Lamar Sprinkles, CMA,  November 28, 2010 12:24 PM  Follow-up for Phone Call        U/A this afternoon with treatment if positive  Follow-up by: Jacques Navy MD,  November 28, 2010 2:54 PM  Additional Follow-up for Phone Call Additional follow up Details #1::        Pt will come in tomorrow am for u/a. Hold phone note open for results.  Additional Follow-up by: Lamar Sprinkles, CMA,  November 28, 2010 3:24 PM

## 2010-12-12 ENCOUNTER — Ambulatory Visit (INDEPENDENT_AMBULATORY_CARE_PROVIDER_SITE_OTHER): Payer: BC Managed Care – PPO | Admitting: Psychology

## 2010-12-12 DIAGNOSIS — F331 Major depressive disorder, recurrent, moderate: Secondary | ICD-10-CM

## 2010-12-13 LAB — DIFFERENTIAL
Basophils Absolute: 0.1 10*3/uL (ref 0.0–0.1)
Basophils Relative: 1 % (ref 0–1)
Eosinophils Absolute: 0.2 10*3/uL (ref 0.0–0.7)
Eosinophils Relative: 2 % (ref 0–5)
Monocytes Absolute: 0.5 10*3/uL (ref 0.1–1.0)
Monocytes Relative: 5 % (ref 3–12)
Neutro Abs: 4.3 10*3/uL (ref 1.7–7.7)

## 2010-12-13 LAB — CBC
HCT: 38.5 % (ref 36.0–46.0)
Hemoglobin: 13.5 g/dL (ref 12.0–15.0)
MCHC: 35.2 g/dL (ref 30.0–36.0)
MCV: 91.6 fL (ref 78.0–100.0)
RBC: 4.2 MIL/uL (ref 3.87–5.11)
RDW: 12.3 % (ref 11.5–15.5)

## 2010-12-13 LAB — POCT CARDIAC MARKERS
Myoglobin, poc: 26.7 ng/mL (ref 12–200)
Myoglobin, poc: 30.7 ng/mL (ref 12–200)

## 2010-12-13 LAB — BASIC METABOLIC PANEL
CO2: 25 mEq/L (ref 19–32)
Calcium: 8.6 mg/dL (ref 8.4–10.5)
Chloride: 106 mEq/L (ref 96–112)
GFR calc Af Amer: 59 mL/min — ABNORMAL LOW (ref >60)
Glucose, Bld: 141 mg/dL — ABNORMAL HIGH (ref 70–99)
Sodium: 137 mEq/L (ref 135–145)

## 2010-12-13 NOTE — Progress Notes (Signed)
Summary: ?'s  Phone Note Call from Patient Call back at Home Phone [phone] [facility]   Call back at [phone]   Summary of Call: 1. Pt thinks she has a UTI, see u/a results, please advise. 2. Pt was seen by Dr Dellia Cloud today. She wants to stay on Pristiqu but w/wants med to help in addition. Would like klonopin.  Initial call taken by: Lamar Sprinkles, CMA,  December 02, 2010 1:34 PM  Follow-up for Phone Call        we can do it - we will have to d/c Valium Follow-up by: Tresa Garter MD,  December 02, 2010 5:46 PM  Additional Follow-up for Phone Call Additional follow up Details #1::        Patient is requesting to stop valium, How should she decrease and start klonopin?  Also advised pt that her u/a is clear.  Additional Follow-up by: Lamar Sprinkles, CMA,  December 02, 2010 6:16 PM    Additional Follow-up for Phone Call Additional follow up Details #2::    Take last Valium at hs. Start Klonopin next am Follow-up by: Tresa Garter MD,  December 02, 2010 10:33 PM  Additional Follow-up for Phone Call Additional follow up Details #3:: Details for Additional Follow-up Action Taken: left mess to call office back..................Marland KitchenLamar Sprinkles, CMA  December 05, 2010 10:34 AM   Pt informed  Additional Follow-up by: Lamar Sprinkles, CMA,  December 05, 2010 11:14 AM  New/Updated Medications: CLONAZEPAM 1 MG TABS (CLONAZEPAM) 1 by mouth three times a day as needed anxiety Prescriptions: CLONAZEPAM 1 MG TABS (CLONAZEPAM) 1 by mouth three times a day as needed anxiety  #90 x 3   Entered by:   Lamar Sprinkles, CMA   Authorized by:   Tresa Garter MD   Signed by:   Lamar Sprinkles, CMA on 12/05/2010   Method used:   [facility]ph[facility] to ...       [facility]  [address] [id number]       [address]       [place], [place]  [address]       Ph: [phone] or [phone]       Fax: [phone]   RxID:   [id number]

## 2010-12-13 NOTE — Medication Information (Signed)
Summary: Clarification/[facility]  [facility]   Imported By: Sherian Rein 12/06/2010 12:23:59  _____________________________________________________________________  External Attachment:    Type:   Image     Comment:   External Document

## 2010-12-14 LAB — BASIC METABOLIC PANEL
BUN: 20 mg/dL (ref 6–23)
CO2: 27 mEq/L (ref 19–32)
CO2: 27 mEq/L (ref 19–32)
CO2: 30 mEq/L (ref 19–32)
Calcium: 9.2 mg/dL (ref 8.4–10.5)
Calcium: 9.3 mg/dL (ref 8.4–10.5)
Chloride: 101 mEq/L (ref 96–112)
Chloride: 101 mEq/L (ref 96–112)
Chloride: 99 mEq/L (ref 96–112)
Creatinine, Ser: 0.85 mg/dL (ref 0.4–1.2)
GFR calc Af Amer: >60 mL/min (ref >60)
GFR calc Af Amer: >60 mL/min (ref >60)
GFR calc non Af Amer: 59 mL/min — ABNORMAL LOW (ref >60)
GFR calc non Af Amer: >60 mL/min (ref >60)
Glucose, Bld: 176 mg/dL — ABNORMAL HIGH (ref 70–99)
Glucose, Bld: 304 mg/dL — ABNORMAL HIGH (ref 70–99)
Glucose, Bld: 306 mg/dL — ABNORMAL HIGH (ref 70–99)
Potassium: 4.2 mEq/L (ref 3.5–5.1)
Potassium: 4.3 mEq/L (ref 3.5–5.1)
Potassium: 4.7 mEq/L (ref 3.5–5.1)
Potassium: 5.1 mEq/L (ref 3.5–5.1)
Sodium: 133 mEq/L — ABNORMAL LOW (ref 135–145)
Sodium: 134 mEq/L — ABNORMAL LOW (ref 135–145)
Sodium: 138 mEq/L (ref 135–145)

## 2010-12-14 LAB — DIFFERENTIAL
Basophils Absolute: 0 10*3/uL (ref 0.0–0.1)
Basophils Relative: 0 % (ref 0–1)
Eosinophils Relative: 0 % (ref 0–5)
Monocytes Absolute: 0.2 10*3/uL (ref 0.1–1.0)
Monocytes Relative: 2 % — ABNORMAL LOW (ref 3–12)
Neutro Abs: 10.9 10*3/uL — ABNORMAL HIGH (ref 1.7–7.7)

## 2010-12-14 LAB — CBC
HCT: 37 % (ref 36.0–46.0)
HCT: 39.8 % (ref 36.0–46.0)
HCT: 45.2 % (ref 36.0–46.0)
Hemoglobin: 12.4 g/dL (ref 12.0–15.0)
Hemoglobin: 13.6 g/dL (ref 12.0–15.0)
Hemoglobin: 15.2 g/dL — ABNORMAL HIGH (ref 12.0–15.0)
MCHC: 33.5 g/dL (ref 30.0–36.0)
MCHC: 33.7 g/dL (ref 30.0–36.0)
MCHC: 34.2 g/dL (ref 30.0–36.0)
MCV: 93.2 fL (ref 78.0–100.0)
MCV: 93.4 fL (ref 78.0–100.0)
Platelets: 243 10*3/uL (ref 150–400)
Platelets: 263 10*3/uL (ref 150–400)
RBC: 4.19 MIL/uL (ref 3.87–5.11)
RBC: 4.81 MIL/uL (ref 3.87–5.11)
RDW: 12.7 % (ref 11.5–15.5)
RDW: 12.7 % (ref 11.5–15.5)
RDW: 12.8 % (ref 11.5–15.5)
WBC: 14.7 10*3/uL — ABNORMAL HIGH (ref 4.0–10.5)

## 2010-12-14 LAB — GLUCOSE, CAPILLARY
Glucose-Capillary: 131 mg/dL — ABNORMAL HIGH (ref 70–99)
Glucose-Capillary: 160 mg/dL — ABNORMAL HIGH (ref 70–99)
Glucose-Capillary: 198 mg/dL — ABNORMAL HIGH (ref 70–99)
Glucose-Capillary: 270 mg/dL — ABNORMAL HIGH (ref 70–99)
Glucose-Capillary: 399 mg/dL — ABNORMAL HIGH (ref 70–99)

## 2010-12-21 ENCOUNTER — Telehealth: Payer: Self-pay | Admitting: *Deleted

## 2010-12-21 ENCOUNTER — Other Ambulatory Visit: Payer: Self-pay | Admitting: Internal Medicine

## 2010-12-21 NOTE — Telephone Encounter (Signed)
Rec Rf req for Hydroco/APAP 10-660 1 po qid prn pain..........Marland Kitchen#120  Last filled 09-02-10...... Ok to Rf?

## 2010-12-23 ENCOUNTER — Ambulatory Visit: Payer: BC Managed Care – PPO | Admitting: Psychology

## 2010-12-23 MED ORDER — HYDROCODONE-ACETAMINOPHEN 10-660 MG PO TABS: 1.0000 | ORAL_TABLET | Freq: Four times a day (QID) | ORAL | Status: DC | PRN

## 2010-12-23 NOTE — Telephone Encounter (Signed)
rf phoned in 

## 2010-12-23 NOTE — Telephone Encounter (Signed)
OK to fill this prescription with additional refills x0 Thank you!  

## 2011-01-24 ENCOUNTER — Encounter: Payer: Self-pay | Admitting: Internal Medicine

## 2011-01-31 ENCOUNTER — Telehealth: Payer: Self-pay | Admitting: Internal Medicine

## 2011-01-31 NOTE — Telephone Encounter (Signed)
Patient calling to report that she had been feeling better and did not keep her appointment in February but in the last 2-3 weeks, she started feeling bad again. C/O chronic nausea, abdominal bloating, burping. She is using Zofran which helps the nausea but it is causing some blurred vision. States she did not get the repeat CT scan that was ordered earlier this year because she felt better and she had not met her deductible at that time. She is willing to do the CT scan now(would need to be done at [facility] due to contrast allergy) Also, states she has a lab visit for diabetic check up on 02/17/11 if she needs lab work done. Scheduled patient to see Dr. Juanda Chance on 03/09/11 at 8:45 AM.

## 2011-01-31 NOTE — Telephone Encounter (Signed)
Did she cancel on time? She should not get any meds or order any test till seen in the office

## 2011-02-01 NOTE — Telephone Encounter (Signed)
Patient no showed on 11/16/10.

## 2011-02-04 ENCOUNTER — Emergency Department (HOSPITAL_COMMUNITY)
Admission: EM | Admit: 2011-02-04 | Discharge: 2011-02-04 | Disposition: A | Discharge status: Home / Self Care | Payer: Medicare Other | Attending: Emergency Medicine | Admitting: Emergency Medicine

## 2011-02-04 DIAGNOSIS — R110 Nausea: Secondary | ICD-10-CM | POA: Insufficient documentation

## 2011-02-04 DIAGNOSIS — R1012 Left upper quadrant pain: Secondary | ICD-10-CM | POA: Insufficient documentation

## 2011-02-04 DIAGNOSIS — E119 Type 2 diabetes mellitus without complications: Secondary | ICD-10-CM | POA: Insufficient documentation

## 2011-02-04 DIAGNOSIS — K589 Irritable bowel syndrome without diarrhea: Secondary | ICD-10-CM | POA: Insufficient documentation

## 2011-02-04 DIAGNOSIS — J4489 Other specified chronic obstructive pulmonary disease: Secondary | ICD-10-CM | POA: Insufficient documentation

## 2011-02-04 DIAGNOSIS — IMO0001 Reserved for inherently not codable concepts without codable children: Secondary | ICD-10-CM | POA: Insufficient documentation

## 2011-02-04 DIAGNOSIS — J449 Chronic obstructive pulmonary disease, unspecified: Secondary | ICD-10-CM | POA: Insufficient documentation

## 2011-02-04 DIAGNOSIS — R51 Headache: Secondary | ICD-10-CM | POA: Insufficient documentation

## 2011-02-04 LAB — URINALYSIS, ROUTINE W REFLEX MICROSCOPIC
Bilirubin Urine: NEGATIVE
Ketones, ur: 15 mg/dL — AB
Nitrite: NEGATIVE
Protein, ur: NEGATIVE mg/dL
Urobilinogen, UA: 0.2 mg/dL (ref 0.0–1.0)
pH: 5 (ref 5.0–8.0)

## 2011-02-04 LAB — COMPREHENSIVE METABOLIC PANEL
ALT: 13 U/L (ref 0–35)
Albumin: 4 g/dL (ref 3.5–5.2)
Alkaline Phosphatase: 71 U/L (ref 39–117)
Calcium: 9.8 mg/dL (ref 8.4–10.5)
GFR calc Af Amer: >60 mL/min (ref >60)
Glucose, Bld: 122 mg/dL — ABNORMAL HIGH (ref 70–99)
Potassium: 4.9 mEq/L (ref 3.5–5.1)
Sodium: 136 mEq/L (ref 135–145)
Total Protein: 7.3 g/dL (ref 6.0–8.3)

## 2011-02-04 LAB — DIFFERENTIAL
Basophils Absolute: 0 10*3/uL (ref 0.0–0.1)
Basophils Relative: 1 % (ref 0–1)
Eosinophils Absolute: 0.1 10*3/uL (ref 0.0–0.7)
Eosinophils Relative: 2 % (ref 0–5)
Lymphocytes Relative: 42 % (ref 12–46)
Monocytes Absolute: 0.4 10*3/uL (ref 0.1–1.0)

## 2011-02-04 LAB — CBC
HCT: 40.1 % (ref 36.0–46.0)
MCHC: 35.7 g/dL (ref 30.0–36.0)
Platelets: 239 10*3/uL (ref 150–400)
RDW: 11.6 % (ref 11.5–15.5)
WBC: 8.4 10*3/uL (ref 4.0–10.5)

## 2011-02-04 LAB — LIPASE, BLOOD: Lipase: 30 U/L (ref 11–59)

## 2011-02-06 ENCOUNTER — Telehealth: Payer: Self-pay | Admitting: Internal Medicine

## 2011-02-06 NOTE — Telephone Encounter (Signed)
I spoke with the patient she reports she was seen in the ER for severe epigastric pain on Saturday.  She was sent home on pain meds.  Pain is not as severe, but still there and she is taking pain meds.  Per ER report to follow up with her GI MD.  Patient is scheduled to see [name] PA on 02/07/11 1:30

## 2011-02-06 NOTE — Telephone Encounter (Signed)
Phone is busy I will continue to try and reach the patient  

## 2011-02-06 NOTE — Telephone Encounter (Signed)
Phone is still busy I will continue to try and reach the patient

## 2011-02-06 NOTE — Telephone Encounter (Signed)
Phone is still busy 

## 2011-02-07 ENCOUNTER — Encounter: Payer: Self-pay | Admitting: Physician Assistant

## 2011-02-07 ENCOUNTER — Ambulatory Visit: Payer: Medicare Other | Admitting: Physician Assistant

## 2011-02-07 ENCOUNTER — Ambulatory Visit (INDEPENDENT_AMBULATORY_CARE_PROVIDER_SITE_OTHER): Payer: Medicare Other | Admitting: Physician Assistant

## 2011-02-07 VITALS — BP 136/84 | HR 72 | Ht 65.0 in | Wt 250.0 lb

## 2011-02-07 DIAGNOSIS — R1013 Epigastric pain: Secondary | ICD-10-CM

## 2011-02-07 DIAGNOSIS — R112 Nausea with vomiting, unspecified: Secondary | ICD-10-CM

## 2011-02-07 DIAGNOSIS — R1011 Right upper quadrant pain: Secondary | ICD-10-CM

## 2011-02-07 MED ORDER — PROMETHAZINE HCL 12.5 MG PO TABS: 25.0000 mg | ORAL_TABLET | Freq: Four times a day (QID) | ORAL | Status: DC | PRN

## 2011-02-07 MED ORDER — OXYCODONE-ACETAMINOPHEN 5-325 MG PO TABS: 1.0000 | ORAL_TABLET | Freq: Four times a day (QID) | ORAL | Status: DC | PRN

## 2011-02-07 NOTE — Patient Instructions (Signed)
We have scheduled the Endoscopy with Dr. Juanda Chance at [facility]. Directions and brochure provided. We have scheduled the Ct scan at [facility].  Directions and contrast provided. We have given you the Percocet prescription to take to your pharmacy.

## 2011-02-07 NOTE — Progress Notes (Signed)
Subjective:    Patient ID: [name], female    DOB: [birth year], 55 y.o.   MRN: [id number]  HPI [name] is a 55 year old white female known to Dr. Julio Alm who has multiple GI issues. She is status post cholecystectomy hysterectomy Nissen fundoplication and repair of a ventral hernia. She has diagnosis of chronic GERD, and previous history of gastroparesis. She also carries diagnosis of COPD adult-onset diabetes mellitus peripheral the end fibromyalgia. She was last seen in January 2012 with complaints of upper abdominal pain nausea and vomiting which eventually improved with anti-meds and PPI therapy. She was to have a CT scan of the abdomen and pelvis done at that time but this was not completed. She said she had done well for a few months and then 3-4 weeks ago began having upper abdominal pain again. She describes episodes of stabbing upper abdominal pain which is located in the epigastrium and left upper quadrant and generally associated with worsening nausea. She says she has been having nausea on a daily basis. This past weekend on 5/12 she had a severe episode of upper abdominal pain radiating into her back and associated with nausea vomiting and dry heaves. She went to the emergency room and was seen and evaluated there. Laboratory studies with CBC ,CMET,  amylase and lipase were all unremarkable. She did not have any imaging done at that time. She was given a prescription for oxycodone and continued on her anti-emetics and Nexium. Today she comes in stating she still hurting though not quite as severe as she  was  on the weekend. She says the pain is "horrible" and that oxycodone does seem to help. She has not had any associated fever or chills has been able to eat intermittently. Has not had any change in her bowel habits no melena or hematochezia no fever or chills. She has not been on any aspirin or NSAIDs and has been maintained on Nexium. She did take a course of doxycycline for an infected  toe but has not had any over the past 2 weeks.  Jakala had a CT of the abdomen and pelvis done in March of 2000 711 which was unremarkable, a gastric emptying scan in November of 2011 which was normal. Last EGD done in July 2009 showed acute gastritis and duodenitis and last colonoscopy was done in 2003 with diverticulosis noted.    Review of Systems  Constitutional: Positive for appetite change.  Eyes: Negative.   Respiratory: Negative.   Cardiovascular: Negative.   Gastrointestinal: Positive for nausea, abdominal pain and abdominal distention.  Musculoskeletal: Positive for myalgias and arthralgias.  Skin: Negative.   Neurological: Negative.   Hematological: Negative.    Outpatient Encounter Prescriptions as of 02/07/2011  Medication Sig Dispense Refill  . albuterol (PROAIR HFA) 108 (90 BASE) MCG/ACT inhaler Inhale 2 puffs into the lungs every 4 (four) hours as needed.        . cetirizine (ZYRTEC HIVES RELIEF) 10 MG tablet Take 10 mg by mouth daily.        . Cholecalciferol (VITAMIN D3) 1000 UNITS CAPS Take by mouth. 2000 international units once daily       . clonazePAM (KLONOPIN) 1 MG tablet Take 1 mg by mouth 3 (three) times daily as needed.        . cycloSPORINE (RESTASIS) 0.05 % ophthalmic emulsion Place 1 drop into both eyes daily.        Marland Kitchen desvenlafaxine (PRISTIQ) 50 MG 24 hr tablet Take 50 mg by  mouth daily.        Marland Kitchen esomeprazole (NEXIUM) 40 MG capsule Take 40 mg by mouth daily before breakfast.        . estrogens, conjugated, (PREMARIN) 0.625 MG tablet Take 0.625 mg by mouth daily. Use 1 g pv at bedtime as needed      . estrogens, conjugated, (PREMARIN) 0.9 MG tablet Take 0.9 mg by mouth daily. Take daily for 21 days then do not take for 7 days.       . furosemide (LASIX) 20 MG tablet Take 20 mg by mouth daily. As needed       . glucose blood test strip 1 each by Other route once. Use as instructed       . Hydrocodone-Acetaminophen 10-660 MG TABS Take 1 tablet by mouth 4 (four)  times daily as needed (for pain).  120 each  0  . hydrOXYzine (VISTARIL) 50 MG capsule Take 50 mg by mouth once. 2 tab by mouth as needed for insomnia      . hyoscyamine (LEVSIN SL) 0.125 MG SL tablet Place 0.125 mg under the tongue every 4 (four) hours as needed.        Marland Kitchen ipratropium-albuterol (DUONEB) 0.5-2.5 (3) MG/3ML SOLN Take 3 mLs by nebulization. 1 via HHN qid prn       . metFORMIN (GLUCOPHAGE) 500 MG tablet Take 500 mg by mouth 2 (two) times daily with a meal.        . ondansetron (ZOFRAN) 4 MG tablet Take 4 mg by mouth every 6 (six) hours.        Marland Kitchen oxyCODONE-acetaminophen (PERCOCET) 5-325 MG per tablet Take 1-2 tablets by mouth every 6 hours as needed for pain       . Probiotic Product (ALIGN) 4 MG CAPS Take 4 mg by mouth as needed. Daily      . tiZANidine (ZANAFLEX) 4 MG tablet TAKE 1 TABLET THREE TIMES A DAY AS NEEDED  90 tablet  1  . triamcinolone (KENALOG) 0.1 % cream Apply topically 3 (three) times daily. As needed for rash            Objective:   Physical Exam Well-developed obese white female in no acute distress, pleasant, alert and oriented x3 HEENT nontraumatic normocephalic EOMI PERRLA wearing glasses Neck; Supple no JVD, Cardiovascula;r regular rate and rhythm with S1-S2 no murmur or gallop  Pulmonary; clear bilaterally  Abdomen; Large soft bowel sounds active, she is tender in the epigastrium and left upper quadrant there is no guarding or rebound no palpable mass or hepatosplenomegaly, multiple abdominal incisional scars  Rectal;  not done  Skin; warm and dry benign healing great toe on the left foot  Psych; mood and affect normal and appropriate talkative.        Assessment & Plan:  #61 55 year old white female with 3 week history of recurrent epigastric and left upper quadrant abdominal pain, nausea and intermittent vomiting with one severe episode prompting an ER visit on 5/12. Etiology of her pain is not clear, rule out acute gastritis, peptic ulcer disease,  intermittent partial obstruction, versus other  Plan; Increase Nexium to 40 mg twice daily for 2 weeks Refill Phenergan 25-50 mg by mouth every 6 hours when necessary nausea vomiting. Patient cannot tolerate Zofran due to headaches and says that low lower dose Phenergan is ineffective. Refill Percocet 5/325 one by mouth every 6 hours when necessary pain #20 no refills Schedule for CT scan  of the abdomen and pelvis, no IV  contrast Schedule for upper endoscopy with Dr. [name]. Procedure was discussed in detail with patient she is agreeable to proceed. Continue bland diet.  #2 Adult-onset diabetes mellitus  #3 COPD, patient quit smoking approximately one year ago  #4 Fibromyalgia  #5 Peripheral neuropathy  #6 Status post cholecystectomy hysterectomy, Nissen fundoplication, appendectomy, ventral hernia repair with mesh  #7 Diverticulosis

## 2011-02-07 NOTE — Assessment & Plan Note (Signed)
[facility]                                 ON-CALL NOTE   [name], [name]                       MRN:          [id number]  DATE:12/25/2008                            DOB:          03-19-1956    [name] called to say she is having abdominal distention with  bloating.  She is passing some gas, but without much relief.  She is  taking hyoscyamine.   I instructed her to stay on liquids and to continue her hyoscyamine and  to add simethicone.  If the symptoms do not improve, she will contact  Dr. Juanda Chance early in the week.     Barbette Hair. Arlyce Dice, MD,FACG  Electronically Signed    RDK/MedQ  DD: 12/25/2008  DT: 12/26/2008  Job #: [id number]   cc:   Hedwig Morton. Juanda Chance, MD

## 2011-02-07 NOTE — Assessment & Plan Note (Signed)
[facility]                                 ON-CALL NOTE   [name], [name]                         MRN:          [id number]  DATE:12/19/2007                            DOB:          [birth year]    Date of interaction December 19, 2007 at 6:47 p.m.  Phone number is [phone].   OBJECTIVE:  The patient has a severe migraine.  She gets migraine  typically, but there are always one-sided.  This point has shifted from  one side to the other.  She has used two Imitrex and Vicodin, two at a  time every 4 hours today, and continues to have significant pain.  She  also now is getting nauseated.  As we were ending the conversation, she  also told me that her brother passed away on 03-07-2023 and became very  tearful.   OBJECTIVE:  Migraine that is prolonged.   PLAN:  Since symptoms have changed and her therapy has not helped, I  really think she needs to be evaluated to make sure not something  different is going on, although with the stress of losing her brother  probably is indeed a headache.  She obviously is not tolerating her  usual therapy and may need something else to abort the headache.  Primary care Albaro Deviney is Dr. Posey Rea, and home office is [place].     Arta Silence, MD  Electronically Signed    RNS/MedQ  DD: 12/19/2007  DT: 12/20/2007  Job #: [id number]

## 2011-02-08 ENCOUNTER — Ambulatory Visit: Payer: Self-pay | Admitting: Physician Assistant

## 2011-02-08 NOTE — Progress Notes (Signed)
Most likely functional GI issues. Appropriate to look for other causes with CT and EGD.  Agree with Ms. Oswald Hillock assessment and plan.

## 2011-02-09 ENCOUNTER — Inpatient Hospital Stay (HOSPITAL_COMMUNITY)
Admission: RE | Admit: 2011-02-09 | Discharge: 2011-02-09 | Payer: BC Managed Care – PPO | Source: Ambulatory Visit | Attending: Physician Assistant | Admitting: Physician Assistant

## 2011-02-09 ENCOUNTER — Ambulatory Visit (INDEPENDENT_AMBULATORY_CARE_PROVIDER_SITE_OTHER)
Admission: RE | Admit: 2011-02-09 | Discharge: 2011-02-09 | Disposition: A | Discharge status: Home / Self Care | Payer: BC Managed Care – PPO | Source: Ambulatory Visit | Attending: Internal Medicine | Admitting: Internal Medicine

## 2011-02-09 ENCOUNTER — Telehealth: Payer: Self-pay | Admitting: Physician Assistant

## 2011-02-09 DIAGNOSIS — R1011 Right upper quadrant pain: Secondary | ICD-10-CM

## 2011-02-09 DIAGNOSIS — R112 Nausea with vomiting, unspecified: Secondary | ICD-10-CM

## 2011-02-09 HISTORY — DX: Barrett's esophagus without dysplasia: K22.70

## 2011-02-09 NOTE — Telephone Encounter (Signed)
Called the pt and apologized for sending her to the wrong place for her CT.  We scheduled it for [facility] and I accidentally gave her instructions for [place] [facility]. She had a reaction to the IV contrast at [facility] in the past and they at that time advised her not to have the test done there in the future. That is why I scheduled it at [facility].   Rose had called me and told me they can do it at their office.  I did call [facility] and cancelled the test from there. The pt looked to see if the results were in yet, I looked and they were not.  I told her we would call when they are in.

## 2011-02-10 ENCOUNTER — Telehealth: Payer: Self-pay | Admitting: *Deleted

## 2011-02-10 MED ORDER — PROMETHAZINE HCL 25 MG PO TABS: ORAL_TABLET | ORAL | Status: DC

## 2011-02-10 NOTE — Discharge Summary (Signed)
NAMELAQUASHIA, [name]                          ACCOUNT NO.:  [id number]   MEDICAL RECORD NO.:  [id number]                   PATIENT TYPE:  INP   LOCATION:  0353                                 FACILITY:  [facility]   PHYSICIAN:  [name]. [name], M.D. [facility]         DATE OF BIRTH:  Jul 20, 1956   DATE OF ADMISSION:  11/24/2003  DATE OF DISCHARGE:  11/28/2003                                 DISCHARGE SUMMARY   ADMISSION DIAGNOSES:  1. Chronic obstructive pulmonary disease/asthma, refractory to outpatient     treatment.  2. Uncontrolled type 2 diabetes.  3. Fibromyalgia.   DISCHARGE DIAGNOSES:  1. Asthmatic bronchitis, improving.  2. Radiographic changes of bronchitis and subsegmental atelectasis versus     scarring of the lingula and minimal right base atelectasis with no acute     infiltrate.  3. Abnormal EKG with nonspecific T-changes with some improvement on serial     EKG's.  4. Diabetes, adequately controlled.   HISTORY:  The patient is a 55 year old white female who has had an  exacerbation of asthmatic bronchitis.  Unfortunately, she had continued to  smoke prior to admission.  The dyspnea progressed to the point where she had  shortness of breath at rest with an intractable paroxysmal cough and  purulent sputum.  This had been unresponsive to oral antibiotics, oral  steroids, and nebulizer treatments.  There was only transient improvement  with nebulizer treatments.   She was admitted with aggressive pulmonary toilet with parenteral steroids.  She was seen in consultation by Dr. Sandrea [name] who questioned a component  of esophageal reflux.  By history, she has had a Nissen fundoplication.  Her  last upper endoscopy was in September 2002, which revealed no stricture or  Barrett's.   She remained afebrile throughout the hospitalization.  Blood pressure was  for the most part well controlled.  On isolated occasions her blood pressure  was as high as 153 systolic.   Serial  chest x-rays showed static changes with the atelectasis and possible  lingular scarring.   Room air O2 saturations were normal.  White count was elevated to 17,000.  Her diabetes control was good with a hemoglobin A1C of 5.7.   Lovenox prophylaxis was continued in the hospital because of the risk of  deep vein thrombosis.   On the day of discharge, O2 saturations were 94% on room air.  Her cough was  becoming more productive with yellow-green sputum.  She was afebrile and had  a resting respiratory rate of 18, and a pulse of 55.  Blood pressure 115/68.  She did have low-grade wheezing, but no increased work of breathing.  A  loose cough was noted.   The results of the chest x-ray and EKG were reviewed with her.  The  discharge summary was dictated in her presence to facilitate continuity of  care.   For optimal diabetic control, it was recommended that she consider  carbohydrate restrictions such as ____________Sugar Busters or Nebo,  avoiding [place]stone.  She states that her total cholesterol has been well within  normal limits at 172;  she believes her HDL is also normal.  She has been on  hormone replacement therapy since her hysterectomy remotely.  The  cardiovascular risks of smoking were discussed with her, particularly with  the non-specific T-changes.  She states that Dr. Chales [name] had seen her  previously.  She asked about the risk of hormone replacement.  Certainly,  there is increased risk if she continues to smoke.  In addition to the  cardiovascular risks, it is virtually impossible for her asthmatic  bronchitis to become quiescent if she continues to smoke.   She will be discharged on her home medications, including Floredil and  albuterol and Atrovent q.4h. nebulizer as needed.  She will continue her  Lexapro, Seroquel, diazepam, and Vicodin as per prescription.  Guaifenesin  such as Mucinex q.12h. will be recommended to thin secretions.  Forced  hydration with at  least 40 ounces of water a day will be encouraged.  She  will continue Protonix or Prilosec over-the-counter before breakfast and  before the evening meal.  It will be recommended that she avoid triggers for  esophageal reflux such as the aspirin family which can include ibuprofen,  Naproxen, aspirin, or any other non-steroidals.  Tylenol use would be okay.  It is also recommended that she avoid excessive alcohol, peppermint,  tobacco, and caffeine, coffee, tea, cola, and chocolate.  Most importantly,  she is to be n.p.o. for at least two to three hours prior to bedtime to  prevent reflux at night which will aggravate her asthmatic bronchitis.  She  will complete oral steroids.  If she wants to continue the oral antibiotic  _________.  She will be asked to follow up with Dr. Sherene [name] five days post  discharge for medication adjustment.   DISCHARGE STATUS:  Improved.   PROGNOSIS:  Depends on addressing the cardiopulmonary risk factors.                                               [name]. [name], M.D. [facility]    WFH/MEDQ  D:  11/28/2003  T:  11/28/2003  Job:  [id number]   cc:   [name]. [name], M.D. [facility]

## 2011-02-10 NOTE — Procedures (Signed)
[facility]  Patient:    [name], [name] Visit Number: [id number] MRN: [id number]          Service Type: END Location: ENDO Attending Physician:  Mervin Hack Dictated by:   Hedwig Morton. Juanda Chance, M.D. [facility] Admit Date:  06/19/2001                             Procedure Report  PROCEDURE:  Upper endoscopy.  ENDOSCOPIST:  Hedwig Morton. Juanda Chance, M.D.  INDICATION:  This 55 year old white female with severe gastroesophageal reflux disease underwent Nissen fundoplication several years ago and was well until approximately a year ago, when she developed epigastric and substernal and subxiphoid chest pain.  It initially came intermittently but recently it has been on a daily basis, somewhat positional, relieved in the left decubital position and lying down, but worse standing up.  There has been no dysphagia, although patient has not been able to belch.  She also relates increased pain with eating and relief of the pain with belching.  She had a barium swallow which did not show any evidence of reflux or any deformity in the stomach -- she has a positive family history of gastric cancer -- with the diagnosis of Barretts esophagus made several years ago but on the last endoscopy, biopsies were indeterminate.  She is undergoing upper endoscopy with possible esophageal dilation of Nissen fundoplication and re-biopsy of the distal esophagus.  ENDOSCOPE:  Olympus single-channel videoscope.  SEDATION:  Versed 12.5 mg IV, Demerol 100 mg IV.  FINDINGS:  Olympus single-channel videoscope was passed under direct vision in through the posterior pharynx into the esophagus.  Patient was monitored by a pulse oximeter; her oxygen saturations were normal.  She was very cooperative. Proximal, mid and distal esophagus was normal.  Squamocolumnar junction was at the diaphragmatic hiatus.  There was no hiatal hernia.  Endoscope traversed into the stomach without resistance,  although the diaphragmatic hiatus was closed.  The lower esophageal sphincter seemed to be closed at all times but not hypertensive.  We were able to advance the scope back and forth through the LES without any difficulty.  Multiple biopsies were taken from the GE junction to rule out Barretts esophagus.  Stomach:  Stomach was insufflated with air and showed normal gastric folds, antrum and pyloric outlet.  Retroflexion of endoscope revealed functioning Nissen fundoplication.  Duodenum:  Duodenal bulb and descending duodenum were normal.  Guidewire was then placed into the stomach and Savary dilators passed over the guidewire using 18- and 19-mm dilators, which passed through the LES with mild resistance.  Patient tolerated procedure well.  IMPRESSION: 1. Status post Nissen fundoplication. 2. Status post Savary dilatation with 18 and 19 mm. 3. Biopsies of the gastroesophageal junction.  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 Molli Hazard B. Daphine Deutscher concerning her Nissen fundoplication. Dictated by:   Hedwig Morton. Juanda Chance, M.D. [facility] Attending Physician:  Mervin Hack DD:  06/19/01 TD:  06/19/01 Job: [id number]

## 2011-02-10 NOTE — Op Note (Signed)
[name], [name]                          ACCOUNT NO.:  [id number]   MEDICAL RECORD NO.:  [id number]                   PATIENT TYPE:  INP   LOCATION:  0455                                 FACILITY:  [facility]   PHYSICIAN:  Wilhemina Bonito. Marina Goodell, M.D. [facility]             DATE OF BIRTH:  12-Apr-1956   DATE OF PROCEDURE:  01/23/2003  DATE OF DISCHARGE:                                 OPERATIVE REPORT   PROCEDURE:  Endoscopic retrograde cholangiopancreatography.   INDICATIONS FOR PROCEDURE:  Abdominal pain, elevated liver function tests  and gallstones.   HISTORY:  This is a 55 year old white female with a history of reflux  disease status post fundoplication who presented to the emergency room  earlier today with severe epigastric pain. Initial liver function tests were  abnormal with SGOT 325, SGPT 149, and total bilirubin of 1.5.  Alkaline  phosphatase was normal. White blood cell count 8.7. She was afebrile.  Abdominal ultrasound revealed gallstones. She was felt to have cholecystitis  and was placed on antibiotics. Repeat set of liver function tests showed  somewhat worsening transaminases. The patient was seen in consultation by  Dr. Arlyce Dice. It was felt by Dr. Arlyce Dice and surgery that the patient should  undergo preoperative ERCP to exclude common bile duct stone. The nature of  the procedure as well as the risks, benefits, and alternatives were  reviewed. She understood and agreed to proceed.   PHYSICAL EXAMINATION:  GENERAL:  Moderately ill appearing female in no acute  distress. She is alert and oriented.  VITAL SIGNS:  Stable.  LUNGS:  Clear.  HEART:  Regular.  ABDOMEN:  Obese and soft with mild epigastric tenderness to palpation, good  bowel sounds heard.   DESCRIPTION OF PROCEDURE:  After informed consent was obtained, the patient  was sedated with 20 mg of Demerol and 3 mg of Versed IV. Glucagon 0.5 mg IV  was given as a duodenal relaxant. The Olympus side viewing endoscope was  passed blindly into the esophagus. 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 The stomach was normal, otherwise. The duodenal bulb was  normal. The post bulbar duodenum revealed a normal appearing ampulla with a  small periampullary diverticulum.   AXILLARY FINDINGS:  1. Scout radiograph of the abdomen with the endoscope in position was     unremarkable.  2. Initial injection of contrast yielded partial pancreatogram which was     normal.  3. Common bile duct was then selectively and deeper cannulated. Complete     filling of the biliary tree revealed no abnormalities. The biliary tree     was of normal diameter with no evidence of stones or obstruction. The     cystic duct was noted to fill.   IMPRESSION:  1. Abdominal pain and gallstones and elevated liver tests, rule out     cholecystitis.  2. Normal preoperative endoscopic retrograde cholangiopancreatography.  RECOMMENDATIONS:  1. Proceed to laparoscopic cholecystectomy in a.m.  2. If gallbladder not significantly inflamed then consider acute hepatitis     serologies.                                               Wilhemina Bonito. Marina Goodell, M.D. [facility]    JNP/MEDQ  D:  01/23/2003  T:  01/23/2003  Job:  [id number]   cc:   Anselm Pancoast. Zachery Dakins, M.D.  [address]  [place]  [place] [address]  Fax: [phone]   Ollen Gross. Vernell Morgans, M.D.  [address]  [place]  [place] [address]  Fax: [phone]   Barbette Hair. Arlyce Dice, M.D. [facility]   [name], M.D. [facility]

## 2011-02-10 NOTE — Op Note (Signed)
[name], [name]                          ACCOUNT NO.:  [id number]   MEDICAL RECORD NO.:  [id number]                   PATIENT TYPE:  INP   LOCATION:  0455                                 [facility]ACILITY:  [facility]   PHYSICIAN:  Ollen Gross. Vernell Morgans, M.D.              DATE OF BIRTH:  03-06-1956   DATE OF PROCEDURE:  01/24/2003  DATE OF DISCHARGE:  01/25/2003                                 OPERATIVE REPORT   PREOPERATIVE DIAGNOSIS:  Cholelithiasis.   POSTOPERATIVE DIAGNOSIS:  Cholelithiasis.   PROCEDURE:  Laparoscopic cholecystectomy with intraoperative cholangiogram.   SURGEON:  Ollen Gross. Carolynne Edouard, M.D.   ASSISTANT:  Lebron Conners, M.D.   ANESTHESIA:  General endotracheal anesthesia.   DESCRIPTION OF PROCEDURE:  After informed consent was obtained the patient  was brought  to the operating room and placed in the supine position on the  operating table. After adequate induction of general anesthesia the  patient's  abdomen was prepped with Betadine and draped in the usual sterile  manner. The area below the umbilicus was infiltrated with 0.25% Marcaine.   A small incision was made with the #15 blade knife. This incision was  carried down through the subcutaneous tissue bluntly using the Kelly clamp  and [employer] retractors until the linea alba was identified. The linea alba  was incised with the #15 blade knife. Each side was grasped with a Kocher  clamp and elevated anteriorly.   The preperitoneal space was probed bluntly with the hemostat until the  peritoneum was opened and access was gained to the abdominal cavity. A 0  Vicryl pursestring stitch was placed in the fascia surrounding the opening.  A Hasson cannula was placed through the opening and anchored in place with a  previously placed Vicryl pursestring stitch. The abdomen was then  insufflated with CO2 without difficulty.   The patient was placed in a head-up position. A laparoscope was placed  through the Hasson cannula and  the right upper quadrant was inspected. The  dome of the gallbladder and liver were readily identified. The epigastric  region was then infiltrated with 0.25% Marcaine and a small incision was  made with the #15 blade knife and a 10-mm port was placed bluntly through  this incision into the abdominal cavity under direct visualization.   There was a small adhesion of omentum to the anterior abdominal wall in the  right upper quadrant which was taken down bluntly with the dissector. The  sites were then chosen on the right side of the abdomen for placement of the  5-mm ports and each of these area was infiltrated with 0.25% Marcaine. Soft  stab incisions were made with the #15 blade  knife and 5-mm ports were  placed bluntly through these incisions into the abdominal cavity under  direct vision.   A blunt grasper was then placed through the lateral-most 5-mm port and used  to grasp  the dome of the gallbladder and elevated anteriorly and superiorly.  Another blunt grasper was placed through the other 5-mm port and used to  retract on the body and neck of the gallbladder. The dissector was placed  through the upper midline port, and using the electrocautery, the peritoneal  reflection at the gallbladder neck area was opened.   Blunt dissection was then carried out in this area until the gallbladder  neck and cystic duct junction was readily identified and dissected  circumferentially until a good window was created. A single clip was placed  on the gallbladder neck. A small ductotomy was made.   A 14-guage angiocath was placed percutaneously through the anterior  abdominal wall under direct vision. A Reddick cholangiogram catheter was  then placed through the angiocath and into the cystic duct. The Reddick  catheter was anchored in place with a clip. The cholangiogram was obtained  which showed 2 filling defects distally in the common bile duct.   The anchoring clip was then removed and  the catheter was fed several times  without difficulty down the bile duct. The balloon was inflated and gently  the catheter was retracted. After doing several times the catheter was then  anchored again to the cystic duct with a clip and a new cholangiogram was  obtained which showed no filling defects.   The anchoring clip and catheter was then removed from the patient. The clips  were placed proximally on the cystic duct and the duct was divided between  the 2 sets of clips. The posterior to this the cystic artery was identified  and again dissected in a circumferential manner until a good window was  created. Two clips were placed proximally and 1 distally on the artery and  the artery was divided between the 2. There were several small branches of  artery that were also identified that were also clipped and then divided  with the hook electrocautery.   The electrocautery hook was then used to separate the gallbladder from the  liver bed. Prior to  completely detaching the gallbladder from the liver  bed, the liver bed was inspected and several small bleeding points were  coagulated with the electrocautery. The gallbladder was then detached and  lifted away from the liver bed without difficulty.   The laparoscope was then moved to the upper midline port and a gallbladder  grasper was placed through the Hasson cannula and used to grasp the neck of  the gallbladder. The gallbladder was then removed with the Hasson cannula  through the infraumbilical port without difficulty. The fascial defect was  closed with the previously placed Vicryl pursestring stitch as well as with  another interrupted 0 Vicryl stitch.   The abdomen was then irrigated with copious amounts of saline until the  effluent was clear. The ports were then all removed under direct  visualization and found to be hemostatic. The gas was allowed to escape. The skin incisions were all closed with interrupted 4-0 Monocryl  subcuticular  stitches. Benzoin and Steri-Strips were applied.   The patient tolerated the procedure well. At the end of the case all sponge,  instrument and needle counts were correct. The patient was  then awakened  and taken to the recovery room in stable condition.  Ollen Gross. Vernell Morgans, M.D.    PST/MEDQ  D:  01/25/2003  T:  01/26/2003  Job:  [id number]

## 2011-02-10 NOTE — Telephone Encounter (Signed)
Patient given results of CT scan. Patient states that [name], PA told her to take Phenergan 25 mg 1-2 tab every 6 hours prn nausea but Phenergan 12.5 mg 1-2 every 6 hours was sent to her pharmacy. Verified with note and Lowry Ram , CMA that patient was to have Phenergan 25 mg 1-2 every 6 hours prn. New rx sent to patient's pharmacy. Patient aware.

## 2011-02-10 NOTE — Telephone Encounter (Signed)
Message copied by Jesse Fall on Fri Feb 10, 2011  2:08 PM ------      Message from: [place], [place]      Created: Fri Feb 10, 2011  1:59 PM       PLEASE CALL [place] AND LET HER KNOW THE CT SCAN IS NORMAL. SHE IS SCHEDULED FOR EGD SOON WITH DR. Juanda Chance

## 2011-02-10 NOTE — Consult Note (Signed)
NAMEKORRYN, [name]                          ACCOUNT NO.:  [id number]   MEDICAL RECORD NO.:  [id number]                   PATIENT TYPE:  INP   LOCATION:  0353                                 FACILITY:  [facility]   PHYSICIAN:  Charlaine Dalton. Sherene Sires, M.D. [facility]           DATE OF BIRTH:  06/03/1956   DATE OF CONSULTATION:  11/25/2003  DATE OF DISCHARGE:                                   CONSULTATION   REASON FOR CONSULTATION:  Coughing and dyspnea.   HISTORY:  This is a 55 year old white female active smoker with a history of  asthmatic bronchitis with a baseline in November of 2004 of not requiring  any form of daily inhaler or prednisone, but activity smoking.  Since  December, she says she has gone downhill.  Whereas previously she had  dyspnea only with anything more than slow activities of daily living (such  as vacuuming or heavy housework), she now has dyspnea at rest associated  with a hacking cough, worsening hoarseness, and intermittently purulent  sputum that has failed to respond to outpatient treatment with both  antibiotics (Ketek, although he did not take it correctly) and prednisone,  as well as nebulizers every 4 hours.  She was admitted by Dr. Posey Rea  yesterday for these refractory symptoms, and continues to feel continually  short of breath at rest with generalized chest tightness.  Her tightness has  been present for at least 2 weeks, and only improves transiently with  nebulizer therapy.  It is not pleuritic in nature, and does not worsen with  activity.  She denies any overt reflux or sinus symptoms, fevers, chills,  sweats, chest pain, or abdominal pain.   PAST MEDICAL HISTORY:  1. Significant for GERD, status post Nissen fundoplication remotely.  Last     EGD in September of 2002 negative for Barrett's (although this was     suspected previously.  2. She also carries a diagnosis of fibromyalgia.  3. She has had sinus surgery twice before.  4. History of depression.  5. Irritable bowel syndrome.  6. Migraines.  7. Type 2 diabetes, diet controlled.   MEDICATIONS:  1. A nebulizer with albuterol and Atrovent.  2. Foradil.  3. Lexapro.  4. Seroquel.  5. Diazepam.  6. Vicodin.   ALLERGIES:  1. AVELOX.  2. TOPAMAX.  3. TORADOL.   SOCIAL HISTORY:  She is an active smoker.  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  She is married.   FAMILY HISTORY:  Positive for asthma in her sister and son.   REVIEW OF SYSTEMS:  Taken in detail, and essentially negative, except as  noted above.   PHYSICAL EXAMINATION:  GENERAL:  This is an anxious white female able lie  back at less than 30 degrees, although obviously hoarse and with mild  increased work of breathing at rest.  HEENT:  Unremarkable.  Pharynx clear.  No evidence of excessive postnasal  drainage.  Ear canals clear bilaterally.  NECK:  Supple without cervical adenopathy or tenderness.  LUNGS:  The lung fields revealed more pseudowheeze than true wheeze  bilaterally; however, diminished breath sounds.  HEART:  Regular rate and rhythm without murmur, gallop, rub, or __________,  S1, S2.  ABDOMEN:  Soft and benign with no palpable organomegaly, masses, or  tenderness, and excursion was normal in the supine position.  EXTREMITIES:  Warm without calf tenderness, clubbing, cyanosis, or edema.  NEUROLOGIC:  No focal defects __________.   X-RAY:  Chest x-ray revealed slight increased markings only.   LABORATORY DATA:  Lab studies were remarkable for normal cardiac enzymes,  and a BMP of less than 100.   IMPRESSIONS:  Chronic obstructive pulmonary disease with atypical asthma  symptoms with marked pseudowheezing in this patient previously documented to  have gastroesophageal reflux disease.  Status post esophageal stretching in  2002, and previous fundoplication in 1999.  I suspect at this point, because  she is refractory to both outpatient therapy with nebulizers and prednisone,  as  well as multiple antibiotics, that she either has active sinusitis,  active gastroesophageal reflux disease, or both, in the setting of continued  smoking against medical advice.  The atypical chest pain suggests to me  gastroesophageal reflux disease as a possible unifying diagnosis here (which  would be worse, of course, related to weight gain and related to active  smoking).   RECOMMENDATIONS:  1. Pursue sinus CT scan to rule out this possibility.  2. Repeat Ketek, this time taking 400 mg tablets two daily for at least 5     day, pending the results of the sinus CT scan.  3. Increase PPI therapy up to b.i.d.  4. Teach flutter valve technique, as well as pursed lip, to prevent     pseudowheeze from upper airway irritability and/or lower airway passive     collapse during expiration.   Absolutely, she needs to commit to stop smoking.  Hopefully, she will take  advantage of this hospitalization to maintain smoking abstinence at the time  of discharge.                                               Charlaine Dalton. Sherene Sires, M.D. [facility]    MBW/MEDQ  D:  11/25/2003  T:  11/26/2003  Job:  [id number]   cc:   Georgina Quint. Plotnikov, M.D. [facility]

## 2011-02-10 NOTE — Op Note (Signed)
[name], BROOKER                ACCOUNT NO.:  [id number]   MEDICAL RECORD NO.:  [id number]          PATIENT TYPE:  OBV   LOCATION:  0098                         FACILITY:  [facility]   PHYSICIAN:  Thornton Park. Daphine Deutscher, MD  DATE OF BIRTH:  August 26, 1956   DATE OF PROCEDURE:  11/15/2004  DATE OF DISCHARGE:                                 OPERATIVE REPORT   PREOPERATIVE DIAGNOSIS:  Ventral incisional hernia at the umbilicus.   POSTOPERATIVE DIAGNOSIS:  Ventral incisional hernia at the umbilicus,  incarcerated.   PROCEDURE:  Takedown of incarcerated ventral incisional hernia, and repair  with medium-sized Ventralex mesh (6.4 cm in diameter).   SURGEON:  Thornton Park. Daphine Deutscher, M.D.   DESCRIPTION OF PROCEDURE:  The patient was taken to room #1 on November 15, 2004, and given general anesthesia.  The abdomen was prepped with Betadine  and draped sterilely.  A longitudinal incision was made, and the hernia sac  was encountered.  I stripped it away down to the fascia, at which time I  opened and went around the perimeter.  This was incarcerated with fat.  I  took all of this down, freeing up plenty of the fascia underneath it.  I  then inserted a medium-sized Ventralex which seemed to fit nicely.  I then  repaired the defect with symmetrically-placed eight sutures around the  periphery, suturing it in to the Marlex portion of the mesh, leaving the  Gore-Tex exposure to the inside of the abdomen.  These were tied down and a  ninth stitch was added to snug up everything and to complete the closure.  The wound was irrigated with saline and closed with #4-0 subcutaneously, and  with staples.   The patient seemed to tolerate the procedure well.  She will be admitted for  observation.      MBM/MEDQ  D:  11/15/2004  T:  11/15/2004  Job:  [id number]   cc:   Georgina Quint. Plotnikov, M.D. [facility]

## 2011-02-10 NOTE — H&P (Signed)
[name], [name]                          ACCOUNT NO.:  [id number]   MEDICAL RECORD NO.:  [id number]                   PATIENT TYPE:  INP   LOCATION:  0353                                 FACILITY:  [facility]   PHYSICIAN:  Georgina Quint. Plotnikov, M.D. [facility]      DATE OF BIRTH:  [birth year]   DATE OF ADMISSION:  11/24/2003  DATE OF DISCHARGE:                                HISTORY & PHYSICAL   CHIEF COMPLAINT:  Shortness of breath.   HISTORY OF PRESENT ILLNESS:  The patient is a 55 year old female who was  seen on February 23rd for a COPD asthma exacerbation and bronchitis and  treated with Ketek, prednisone, AlleRx, Foradil and she has not improved.  In fact, she is feeling bad over past day, quite short of breath today,  thinks that she may pass out.  States she has been unable to eat or drink  today due to nausea and weakness.   PAST MEDICAL HISTORY:  1. COPD.  2. Fibromyalgia.  3. Irritable bowel syndrome.  4. Migraines.  5. Type 2 diabetes diet controlled.  6. Depression.  7. GERD.   MEDICATIONS:  As above, hand-held nebulizer with Atrovent and albuterol,  Foradil, Lexapro 20 mg a day, Seroquel 200 mg q.h.s., diazepam 5 mg one to  two a day, Vicodin 7.5/750 p.r.n., tizanidine 4 mg p.r.n., Imitrex p.r.n.  migraines.   ALLERGIES:  AVELOX, TOPAMAX, TORADOL.   SOCIAL HISTORY:  She continues to smoke.  She works at [employer].  She is married.   FAMILY HISTORY:  Sister and son have asthma.   REVIEW OF SYSTEMS:  No chest pain.  She is very short of breath.  No leg  pain, no syncope, no neurologic complaints.  Continues to be under a lot of  stress.  Sugars at home are normal and the rest is as above or negative.   PHYSICAL EXAMINATION:  VITAL SIGNS:  Blood pressure 120/80, pulse 66, heart  rate 97, weight 248 pounds.  GENERAL:  She is in mild acute distress.  She is dyspneic at rest and more  with exertion.  HEENT:  With moist mucosa.  NECK:   Supple, no meningeal signs.  LUNGS:  With prolonged expiratory phase.  HEART:  With S1 & S2, tachycardic.  ABDOMEN:  Soft, nontender, no organomegaly.  No masses felt.  EXTREMITIES:  Lower extremities with trace edema.  SKIN:  With some ecchymosis on the wrists at random distribution  NEUROLOGICAL:  She is anxious, alert, cooperative.   LABORATORY DATA:  O2 saturation 97% on room air.   ASSESSMENT AND PLAN:  1. Chronic obstructive pulmonary disease/asthma refractory to outpatient     treatment.  Will admit for IV steroids, hand-held nebulization with     albuterol and Atrovent.  I will obtain pulmonary consultation tomorrow.     She has finished Ketek, will not resume.  She understands that she needs  to stop smoking as soon as possible.  2. Type 2 diabetes on a diet.  Check A1C, Humalog sliding scale.  3. Anxiety.  Continue current therapy.  4. Fibromyalgia.  Continue current therapy.  5. Lower extremity edema/trace.  Will keep legs elevated.  6. Deep vein thrombosis prophylaxis with Lovenox.                                               Georgina Quint. Plotnikov, M.D. [facility]    AVP/MEDQ  D:  11/24/2003  T:  11/24/2003  Job:  [id number]   cc:   [name], M.D. [facility]

## 2011-02-17 ENCOUNTER — Other Ambulatory Visit: Payer: Medicare Other

## 2011-02-17 ENCOUNTER — Other Ambulatory Visit: Payer: Self-pay | Admitting: Internal Medicine

## 2011-02-17 DIAGNOSIS — E119 Type 2 diabetes mellitus without complications: Secondary | ICD-10-CM

## 2011-02-17 DIAGNOSIS — T887XXA Unspecified adverse effect of drug or medicament, initial encounter: Secondary | ICD-10-CM

## 2011-02-21 ENCOUNTER — Encounter: Payer: Self-pay | Admitting: Internal Medicine

## 2011-02-21 ENCOUNTER — Other Ambulatory Visit: Payer: Self-pay | Admitting: Internal Medicine

## 2011-02-21 ENCOUNTER — Ambulatory Visit (HOSPITAL_COMMUNITY)
Admission: RE | Admit: 2011-02-21 | Discharge: 2011-02-21 | Disposition: A | Discharge status: Home / Self Care | Payer: Medicare Other | Source: Ambulatory Visit | Attending: Internal Medicine | Admitting: Internal Medicine

## 2011-02-21 ENCOUNTER — Encounter: Payer: Medicare Other | Admitting: Internal Medicine

## 2011-02-21 DIAGNOSIS — R109 Unspecified abdominal pain: Secondary | ICD-10-CM | POA: Insufficient documentation

## 2011-02-21 DIAGNOSIS — Z01812 Encounter for preprocedural laboratory examination: Secondary | ICD-10-CM | POA: Insufficient documentation

## 2011-02-21 DIAGNOSIS — K219 Gastro-esophageal reflux disease without esophagitis: Secondary | ICD-10-CM | POA: Insufficient documentation

## 2011-02-21 DIAGNOSIS — K2990 Gastroduodenitis, unspecified, without bleeding: Secondary | ICD-10-CM | POA: Insufficient documentation

## 2011-02-21 DIAGNOSIS — K2970 Gastritis, unspecified, without bleeding: Secondary | ICD-10-CM | POA: Insufficient documentation

## 2011-02-22 ENCOUNTER — Encounter: Payer: Self-pay | Admitting: Internal Medicine

## 2011-02-22 ENCOUNTER — Ambulatory Visit (INDEPENDENT_AMBULATORY_CARE_PROVIDER_SITE_OTHER): Payer: Medicare Other | Admitting: Internal Medicine

## 2011-02-22 ENCOUNTER — Other Ambulatory Visit (INDEPENDENT_AMBULATORY_CARE_PROVIDER_SITE_OTHER): Payer: Medicare Other

## 2011-02-22 ENCOUNTER — Telehealth: Payer: Self-pay | Admitting: Internal Medicine

## 2011-02-22 DIAGNOSIS — F4321 Adjustment disorder with depressed mood: Secondary | ICD-10-CM

## 2011-02-22 DIAGNOSIS — E119 Type 2 diabetes mellitus without complications: Secondary | ICD-10-CM

## 2011-02-22 DIAGNOSIS — T887XXA Unspecified adverse effect of drug or medicament, initial encounter: Secondary | ICD-10-CM

## 2011-02-22 DIAGNOSIS — R1012 Left upper quadrant pain: Secondary | ICD-10-CM

## 2011-02-22 DIAGNOSIS — IMO0001 Reserved for inherently not codable concepts without codable children: Secondary | ICD-10-CM

## 2011-02-22 DIAGNOSIS — K219 Gastro-esophageal reflux disease without esophagitis: Secondary | ICD-10-CM

## 2011-02-22 LAB — BASIC METABOLIC PANEL
CO2: 24 mEq/L (ref 19–32)
Calcium: 8.9 mg/dL (ref 8.4–10.5)
Chloride: 107 mEq/L (ref 96–112)
Creatinine, Ser: 0.8 mg/dL (ref 0.4–1.2)
Sodium: 138 mEq/L (ref 135–145)

## 2011-02-22 LAB — HEPATIC FUNCTION PANEL
ALT: 15 U/L (ref 0–35)
Alkaline Phosphatase: 59 U/L (ref 39–117)
Bilirubin, Direct: 0.1 mg/dL (ref 0.0–0.3)
Total Bilirubin: 0.3 mg/dL (ref 0.3–1.2)
Total Protein: 6.6 g/dL (ref 6.0–8.3)

## 2011-02-22 MED ORDER — HYDROCODONE-ACETAMINOPHEN 10-660 MG PO TABS: 1.0000 | ORAL_TABLET | Freq: Four times a day (QID) | ORAL | Status: DC | PRN

## 2011-02-22 NOTE — Telephone Encounter (Signed)
Message copied by Janeal Holmes on Wed Feb 22, 2011 10:33 PM ------      Message from: Corwin Levins      Created: Wed Feb 22, 2011  5:33 PM      Regarding: lab sent to me in error - see lab results this date                   ----- Message -----         From: Lab In [place] Interface         Sent: 02/22/2011   4:15 PM           To: Oliver Barre, MD

## 2011-02-22 NOTE — Assessment & Plan Note (Signed)
Not better 

## 2011-02-22 NOTE — Telephone Encounter (Signed)
Please, mail the labs to the patient.     

## 2011-02-22 NOTE — Progress Notes (Signed)
  Subjective:    Patient ID: KASI LASKY, female    DOB: [birth year], 55 y.o.   MRN: [id number]  HPI  The patient presents for a follow-up of  chronic hypertension, chronic dyslipidemia, type 2 diabetes controlled with medicines  C/o abd pain thought to be of ? Etiol,  - PUD vs  billiary pancreatitis vs other. She went to ER and then had seen Dr Juanda Chance and had an endoscopy. She had a CT 2 wks ago too. C/o FMS, pains  Review of Systems  Constitutional: Positive for chills and fatigue. Negative for fever and unexpected weight change.       [Obese HENT: Positive for dental problem and sinus pressure.   Eyes: Negative for pain.  Respiratory: Positive for choking and wheezing.   Gastrointestinal: Positive for abdominal pain and diarrhea. Negative for blood in stool.  Genitourinary: Negative for hematuria and pelvic pain.  Musculoskeletal: Positive for back pain.  Neurological: Positive for headaches. Negative for syncope.  Psychiatric/Behavioral: Positive for dysphoric mood. Negative for suicidal ideas. The patient is nervous/anxious.    Wt Readings from Last 3 Encounters:  02/22/11 249 lb (112.946 kg)  02/07/11 250 lb (113.399 kg)  11/23/10 249 lb (112.946 kg)       Objective:   Physical Exam  Constitutional: She appears well-developed. No distress.       Obese  HENT:  Head: Normocephalic.  Right Ear: External ear normal.  Left Ear: External ear normal.  Nose: Nose normal.  Mouth/Throat: Oropharynx is clear and moist.  Eyes: Conjunctivae are normal. Pupils are equal, round, and reactive to light. Right eye exhibits no discharge. Left eye exhibits no discharge.  Neck: Normal range of motion. Neck supple. No JVD present. No tracheal deviation present. No thyromegaly present.  Cardiovascular: Normal rate, regular rhythm and normal heart sounds.   Pulmonary/Chest: No stridor. No respiratory distress. She has no wheezes.  Abdominal: Soft. Bowel sounds are normal. She exhibits no  distension and no mass. There is no tenderness. There is no rebound and no guarding.  Musculoskeletal: She exhibits no edema and no tenderness.  Lymphadenopathy:    She has no cervical adenopathy.  Neurological: She displays normal reflexes. No cranial nerve deficit. She exhibits normal muscle tone. Coordination normal.  Skin: No rash noted. No erythema.  Psychiatric: She has a normal mood and affect. Her behavior is normal. Judgment and thought content normal.       Lab Results  Component Value Date   WBC 8.4 02/04/2011   HGB 14.3 02/04/2011   HCT 40.1 02/04/2011   PLT 239 02/04/2011   CHOL 167 07/26/2010   TRIG 332.0* 07/26/2010   HDL 43.10 07/26/2010   LDLDIRECT 80.3 07/26/2010   ALT 13 02/04/2011   AST 20 SLIGHT HEMOLYSIS 02/04/2011   NA 136 02/04/2011   K 4.9 02/04/2011   CL 100 02/04/2011   CREATININE 0.70 02/04/2011   BUN 13 02/04/2011   CO2 19 02/04/2011   TSH 3.68 07/26/2010   HGBA1C 7.1* 07/26/2010   MICROALBUR 0.4 07/26/2010      Assessment & Plan:  OBESITY, MORBID Not better  Abdominal Pain, Left Upper Quadrant Thought to be pancreatitis related F/up with GI  FIBROMYALGIA On Rx - waxing and waining    Depression under a lot of stres  OnRx

## 2011-02-22 NOTE — Assessment & Plan Note (Signed)
On Rx - waxing and waining

## 2011-02-22 NOTE — Assessment & Plan Note (Signed)
Thought to be pancreatitis related F/up with GI

## 2011-02-23 NOTE — Telephone Encounter (Signed)
Done

## 2011-02-24 ENCOUNTER — Emergency Department (HOSPITAL_COMMUNITY)
Admission: EM | Admit: 2011-02-24 | Discharge: 2011-02-24 | Disposition: A | Discharge status: Home / Self Care | Payer: Medicare Other | Attending: Emergency Medicine | Admitting: Emergency Medicine

## 2011-02-24 ENCOUNTER — Emergency Department (HOSPITAL_COMMUNITY): Payer: Medicare Other

## 2011-02-24 DIAGNOSIS — R51 Headache: Secondary | ICD-10-CM | POA: Insufficient documentation

## 2011-02-24 DIAGNOSIS — J4489 Other specified chronic obstructive pulmonary disease: Secondary | ICD-10-CM | POA: Insufficient documentation

## 2011-02-24 DIAGNOSIS — R109 Unspecified abdominal pain: Secondary | ICD-10-CM | POA: Insufficient documentation

## 2011-02-24 DIAGNOSIS — E119 Type 2 diabetes mellitus without complications: Secondary | ICD-10-CM | POA: Insufficient documentation

## 2011-02-24 DIAGNOSIS — Z79899 Other long term (current) drug therapy: Secondary | ICD-10-CM | POA: Insufficient documentation

## 2011-02-24 DIAGNOSIS — R197 Diarrhea, unspecified: Secondary | ICD-10-CM | POA: Insufficient documentation

## 2011-02-24 DIAGNOSIS — F341 Dysthymic disorder: Secondary | ICD-10-CM | POA: Insufficient documentation

## 2011-02-24 DIAGNOSIS — J449 Chronic obstructive pulmonary disease, unspecified: Secondary | ICD-10-CM | POA: Insufficient documentation

## 2011-02-24 DIAGNOSIS — R52 Pain, unspecified: Secondary | ICD-10-CM | POA: Insufficient documentation

## 2011-02-24 DIAGNOSIS — R110 Nausea: Secondary | ICD-10-CM | POA: Insufficient documentation

## 2011-02-24 DIAGNOSIS — R0602 Shortness of breath: Secondary | ICD-10-CM | POA: Insufficient documentation

## 2011-02-24 DIAGNOSIS — G8929 Other chronic pain: Secondary | ICD-10-CM | POA: Insufficient documentation

## 2011-02-24 DIAGNOSIS — R10819 Abdominal tenderness, unspecified site: Secondary | ICD-10-CM | POA: Insufficient documentation

## 2011-02-24 LAB — CBC
Hemoglobin: 14.4 g/dL (ref 12.0–15.0)
MCH: 29.7 pg (ref 26.0–34.0)
MCHC: 35.5 g/dL (ref 30.0–36.0)
MCV: 83.7 fL (ref 78.0–100.0)
RBC: 4.85 MIL/uL (ref 3.87–5.11)

## 2011-02-24 LAB — URINALYSIS, ROUTINE W REFLEX MICROSCOPIC
Hgb urine dipstick: NEGATIVE
Nitrite: NEGATIVE
Protein, ur: NEGATIVE mg/dL
Specific Gravity, Urine: 1.017 (ref 1.005–1.030)
Urobilinogen, UA: 0.2 mg/dL (ref 0.0–1.0)

## 2011-02-24 LAB — COMPREHENSIVE METABOLIC PANEL
AST: 17 U/L (ref 0–37)
BUN: 12 mg/dL (ref 6–23)
CO2: 21 mEq/L (ref 19–32)
Calcium: 9.5 mg/dL (ref 8.4–10.5)
Chloride: 102 mEq/L (ref 96–112)
Creatinine, Ser: 0.75 mg/dL (ref 0.4–1.2)
GFR calc non Af Amer: >60 mL/min (ref >60)
Glucose, Bld: 139 mg/dL — ABNORMAL HIGH (ref 70–99)
Total Bilirubin: 0.3 mg/dL (ref 0.3–1.2)

## 2011-02-24 LAB — DIFFERENTIAL
Basophils Relative: 1 % (ref 0–1)
Lymphs Abs: 2.9 10*3/uL (ref 0.7–4.0)
Monocytes Absolute: 0.4 10*3/uL (ref 0.1–1.0)
Monocytes Relative: 5 % (ref 3–12)
Neutro Abs: 5.5 10*3/uL (ref 1.7–7.7)
Neutrophils Relative %: 61 % (ref 43–77)

## 2011-02-24 LAB — LIPASE, BLOOD: Lipase: 31 U/L (ref 11–59)

## 2011-02-26 ENCOUNTER — Encounter: Payer: Self-pay | Admitting: Internal Medicine

## 2011-02-27 ENCOUNTER — Telehealth: Payer: Self-pay | Admitting: Internal Medicine

## 2011-02-27 NOTE — Telephone Encounter (Signed)
Patient notified of Dr. Brodie's recommendation 

## 2011-02-27 NOTE — Telephone Encounter (Signed)
Patient calling to report she went to ER on Friday due to severe nausea, diarrhea. She asked for the ER to call Dr. Juanda Chance but they did not. ER doctor saw her and patient states her abdominal xray showed she had lots of air in her stomach. ER MD gave her Reglan po every 6 hours prn. She has been taking this BID since Friday and it has helped the nausea and she feels like "things are moving around in there." She is using her Carafate also. She has a follow up appointment with Dr. Juanda Chance on 03/09/11 and wants to know if it is okay to keep taking the Reglan until the visit. Please, advise.

## 2011-02-27 NOTE — Telephone Encounter (Signed)
It is OK to  Take Reglan till she sees me

## 2011-02-28 ENCOUNTER — Telehealth: Payer: Self-pay | Admitting: *Deleted

## 2011-02-28 NOTE — Telephone Encounter (Signed)
Patient calling because she has realized her appointment with Dr. Juanda Chance is on 03/09/11 and she will not have enough of the Reglan the ER gave her until the appointment. She only got #15 from the ER.  Wants to know what to do. Please, advise

## 2011-03-01 NOTE — Telephone Encounter (Signed)
May refill Reglan 5 mg po ac and 10 mg po qhs, #120, 1 refill

## 2011-03-01 NOTE — Telephone Encounter (Signed)
Unexpected adverse effects many occur when Reglan and Pristiq are coadministered. Symptoms may include signs of serotonin syndrome with extrapyramidal movements. Should this be refilled?

## 2011-03-01 NOTE — Telephone Encounter (Signed)
Thanks for letting me know, we cannot give her this medication.

## 2011-03-02 MED ORDER — METOCLOPRAMIDE HCL 5 MG PO TABS: ORAL_TABLET | ORAL | Status: DC

## 2011-03-02 NOTE — Telephone Encounter (Signed)
Spoke with Dr. Juanda Chance re: patient concerns. Per Dr. Juanda Chance, may give patient Reglan 5 mg BID prn #18. Enough to get to her OV and then will discuss how to manage long term.

## 2011-03-02 NOTE — Telephone Encounter (Signed)
I agree with giving her Reglan bid as long as she realizes potential side effects.[name]

## 2011-03-02 NOTE — Telephone Encounter (Signed)
Patient is very concerned that when she is out of Reglan she will have the pain again. States the ER doctor told her about the adverse effects but he told her with her problems she needed the Reglan. She has been much better taking the Reglan BID per patient. Wants to know what to do until her OV on 03/09/11.

## 2011-03-09 ENCOUNTER — Ambulatory Visit (INDEPENDENT_AMBULATORY_CARE_PROVIDER_SITE_OTHER): Payer: Medicare Other | Admitting: Internal Medicine

## 2011-03-09 ENCOUNTER — Encounter: Payer: Self-pay | Admitting: Internal Medicine

## 2011-03-09 ENCOUNTER — Ambulatory Visit: Payer: Self-pay | Admitting: Internal Medicine

## 2011-03-09 DIAGNOSIS — R112 Nausea with vomiting, unspecified: Secondary | ICD-10-CM

## 2011-03-09 DIAGNOSIS — R1012 Left upper quadrant pain: Secondary | ICD-10-CM

## 2011-03-09 MED ORDER — PEG-KCL-NACL-NASULF-NA ASC-C 100 G PO SOLR: 1.0000 | Freq: Once | ORAL | Status: DC

## 2011-03-09 MED ORDER — METOCLOPRAMIDE HCL 5 MG PO TABS: ORAL_TABLET | ORAL | Status: DC

## 2011-03-09 MED ORDER — AMBULATORY NON FORMULARY MEDICATION: Status: DC

## 2011-03-09 NOTE — Patient Instructions (Addendum)
We have sent a refill of Reglan 5 mg to your pharmacy for you to take every 6 hours as needed until your domperidone arrives. We have send Domperidone 10 mg to [facility] in [place]. You should take 1 tablet by mouth three times daily. You have been scheduled for a colonoscopy. Please follow written instructions given to you at your visit today.  Please pick up your Moviprep kit at the pharmacy within the next 2-3 days. CC:Dr Plotnikov

## 2011-03-09 NOTE — Progress Notes (Signed)
[name] 21-Nov-1955 MRN [id number]     History of Present Illness:  This is a 55 year old white female diabetic with chronic nausea who is status post recent upper endoscopy on 02/23/2011 which was unremarkable. She is status post remote Nissen fundoplication in 2003. She has a prior diagnosis of Barrett's esophagus which was not confirmed on her last endoscopy. A recent CT scan of the abdomen for acute abdominal pain was negative. She had a remote cholecystectomy for cholelithiasis in 2005. Her last gastric emptying scan in November 2011 was normal. Her last colonoscopy in 2003 showed mild diverticulosis of the left colon. She has episodic severe left upper quadrant abdominal pain for which she often seeks emergency room treatment. Her blood tests and radiographic studies have been normal. Reglan seems to relieve her symptoms but it interferes with Pristiq. She has a history of IBS, predominantly diarrhea. Patient is currently constipated. She is on a probiotic.   Past Medical History  Diagnosis Date  . Asthma   . COPD (chronic obstructive pulmonary disease)   . Depression   . GERD (gastroesophageal reflux disease)     Dr Juanda Chance  . Gastroparesis   . Migraines   . Menopause   . IBS (irritable bowel syndrome)   . Diabetes mellitus type II   . Peripheral neuropathy   . Fibromyalgia   . Obesity   . Barrett's esophagus   . MRSA (methicillin resistant Staphylococcus aureus)     possible (by verbal report from pt)  . OSA (obstructive sleep apnea)   . History of hiatal hernia    Past Surgical History  Procedure Date  . Abdominal hysterectomy     Complete  . Sinus surgery with instatrak     x3  . Surgery left heel spur   . Appendectomy   . Ventral hernia repair     with mesh  . Cholecystectomy   . Nissen fundoplication 1999  . Mouth surgery     reports that she quit smoking about 14 months ago. She does not have any smokeless tobacco history on file. She reports that she  does not drink alcohol or use illicit drugs. family history includes Arthritis in an unspecified family member; Breast cancer in her maternal aunt and maternal grandmother; Cancer in her father, maternal grandmother, and mother; Colon cancer in her maternal grandfather and paternal grandfathers; Colon polyps in her fathers, maternal grandfather, and paternal grandfather; Diabetes in her father, maternal grandfather, maternal grandmother, and mother; Esophageal cancer in her father; Heart attack (age of onset:55) in her brother; Heart disease in her father and maternal grandmother; Hypertension in an unspecified family member; Lung cancer in her mother; and Uterine cancer in her other. Allergies  Allergen Reactions  . Ciprofloxacin     sensitivity  . Duloxetine     REACTION: diarrhea  . Hydromorphone Hcl   . Iohexol      Code: HIVES, Desc: PT developed 3 hives post injection of Omni 300. Observed by Dr Clifton James. Given 50 mg PO benedryl.Hives subsided before being released., Onset Date: [id number]   . UUV:OZDGUYQIHKV+QQVZDGLOV+FIEPPIRJJO Acid+Aspartame     REACTION: diarrhea  . Ketorolac Tromethamine   . Moxifloxacin   . Penicillins     sensitivity  . Pioglitazone     REACTION: weight gain  . Pregabalin     REACTION: anaphylaxis  . Quetiapine     REACTION: nightmares  . Topiramate   . Triple Antibiotic         Review of  Systems: Denies dysphagia or heartburn. Denies shortness of breath or chest pain  The remainder of the 10  point ROS is negative except as outlined in H&P   Physical Exam: General appearance  Well developed, in no distress. Eyes- non icteric. HEENT nontraumatic, normocephalic. Mouth no lesions, tongue papillated, no cheilosis. Neck supple without adenopathy, thyroid not enlarged, no carotid bruits, no JVD. Lungs Clear to auscultation bilaterally. Cor normal S1 normal S2, regular rhythm , no murmur,  quiet precordium. Abdomen obese, tender in left upper  quadrant and epigastrium. Normal active bowel sounds. No palpable mass. Rectal: Not done. Extremities no pedal edema. Skin no lesions. Neurological alert and oriented x 3. Psychological normal mood and affect.  Assessment and Plan:  Problem #1 Chronic abdominal pain. Patient has acute episodes of abdominal pain in the left upper quadrant which is consistent with gas-bloat syndrome related to her prior Nissen fundoplication. There is no radiographic evidence of small bowel obstruction. Splenic flexure syndrome may be another possibility. We will start her on Domperidone 10 mg 3 times a day in place of Reglan. However, we will give her Reglan #30 until she can get domperidone filled. We will proceed with a colonoscopy. Her last exam was in 2003.   03/09/2011 [name]

## 2011-03-27 ENCOUNTER — Other Ambulatory Visit: Payer: Self-pay | Admitting: Internal Medicine

## 2011-04-26 ENCOUNTER — Emergency Department (HOSPITAL_COMMUNITY)
Admission: EM | Admit: 2011-04-26 | Discharge: 2011-04-26 | Disposition: A | Discharge status: Home / Self Care | Payer: Medicare Other | Attending: Emergency Medicine | Admitting: Emergency Medicine

## 2011-04-26 ENCOUNTER — Emergency Department (HOSPITAL_COMMUNITY): Payer: Medicare Other

## 2011-04-26 ENCOUNTER — Telehealth: Payer: Self-pay | Admitting: Internal Medicine

## 2011-04-26 DIAGNOSIS — R112 Nausea with vomiting, unspecified: Secondary | ICD-10-CM | POA: Insufficient documentation

## 2011-04-26 DIAGNOSIS — R1013 Epigastric pain: Secondary | ICD-10-CM | POA: Insufficient documentation

## 2011-04-26 DIAGNOSIS — K589 Irritable bowel syndrome without diarrhea: Secondary | ICD-10-CM | POA: Insufficient documentation

## 2011-04-26 DIAGNOSIS — G8929 Other chronic pain: Secondary | ICD-10-CM | POA: Insufficient documentation

## 2011-04-26 DIAGNOSIS — F341 Dysthymic disorder: Secondary | ICD-10-CM | POA: Insufficient documentation

## 2011-04-26 DIAGNOSIS — R197 Diarrhea, unspecified: Secondary | ICD-10-CM | POA: Insufficient documentation

## 2011-04-26 DIAGNOSIS — R142 Eructation: Secondary | ICD-10-CM | POA: Insufficient documentation

## 2011-04-26 DIAGNOSIS — Z79899 Other long term (current) drug therapy: Secondary | ICD-10-CM | POA: Insufficient documentation

## 2011-04-26 DIAGNOSIS — E119 Type 2 diabetes mellitus without complications: Secondary | ICD-10-CM | POA: Insufficient documentation

## 2011-04-26 DIAGNOSIS — R141 Gas pain: Secondary | ICD-10-CM | POA: Insufficient documentation

## 2011-04-26 LAB — DIFFERENTIAL
Basophils Absolute: 0 10*3/uL (ref 0.0–0.1)
Basophils Relative: 0 % (ref 0–1)
Eosinophils Absolute: 0 10*3/uL (ref 0.0–0.7)
Monocytes Absolute: 0.4 10*3/uL (ref 0.1–1.0)
Monocytes Relative: 3 % (ref 3–12)
Neutrophils Relative %: 85 % — ABNORMAL HIGH (ref 43–77)

## 2011-04-26 LAB — BASIC METABOLIC PANEL
Calcium: 9.9 mg/dL (ref 8.4–10.5)
GFR calc Af Amer: >60 mL/min (ref >60)
GFR calc non Af Amer: >60 mL/min (ref >60)
Glucose, Bld: 196 mg/dL — ABNORMAL HIGH (ref 70–99)
Sodium: 137 mEq/L (ref 135–145)

## 2011-04-26 LAB — CBC
MCH: 29.7 pg (ref 26.0–34.0)
MCHC: 35.5 g/dL (ref 30.0–36.0)
Platelets: 237 10*3/uL (ref 150–400)
RBC: 4.85 MIL/uL (ref 3.87–5.11)

## 2011-04-26 NOTE — Telephone Encounter (Signed)
Charge cancellation fee,

## 2011-04-26 NOTE — Telephone Encounter (Signed)
Charge canc. Fee.

## 2011-04-27 ENCOUNTER — Other Ambulatory Visit: Payer: Medicare Other | Admitting: Internal Medicine

## 2011-04-27 ENCOUNTER — Telehealth: Payer: Self-pay | Admitting: Internal Medicine

## 2011-04-27 NOTE — Telephone Encounter (Signed)
Patient reports she went to ER on Wednesday at 3:30 AM and had labs and CT. She wants Dr. Juanda Chance to know and review them. She has r/s her colonoscopy to 05/04/11 at 11:00 AM.

## 2011-05-02 ENCOUNTER — Other Ambulatory Visit: Payer: Self-pay | Admitting: Internal Medicine

## 2011-05-04 ENCOUNTER — Encounter: Payer: Self-pay | Admitting: Internal Medicine

## 2011-05-04 ENCOUNTER — Ambulatory Visit (AMBULATORY_SURGERY_CENTER): Payer: Medicare Other | Admitting: Internal Medicine

## 2011-05-04 DIAGNOSIS — R1012 Left upper quadrant pain: Secondary | ICD-10-CM

## 2011-05-04 DIAGNOSIS — R197 Diarrhea, unspecified: Secondary | ICD-10-CM

## 2011-05-04 DIAGNOSIS — R112 Nausea with vomiting, unspecified: Secondary | ICD-10-CM

## 2011-05-04 DIAGNOSIS — D126 Benign neoplasm of colon, unspecified: Secondary | ICD-10-CM

## 2011-05-04 LAB — GLUCOSE, CAPILLARY: Glucose-Capillary: 140 mg/dL — ABNORMAL HIGH (ref 70–99)

## 2011-05-04 MED ORDER — DIPHENHYDRAMINE HCL 50 MG/ML IJ SOLN: 25.0000 mg | Freq: Once | INTRAMUSCULAR | Status: DC

## 2011-05-04 MED ORDER — SODIUM CHLORIDE 0.9 % IV SOLN: 500.0000 mL | INTRAVENOUS | Status: DC

## 2011-05-04 MED ORDER — DICYCLOMINE HCL 20 MG PO TABS: 20.0000 mg | ORAL_TABLET | Freq: Three times a day (TID) | ORAL | Status: DC | PRN

## 2011-05-04 NOTE — Patient Instructions (Addendum)
Please review discharge instructions (blue and green sheets)  Please review information on polyps  Await biopsy and polyp results  In place of Levsin , start Bentyl 20mg  three times day as needed

## 2011-05-04 NOTE — Progress Notes (Signed)
Per Dr. Regino Schultze orders- Benadryl 25mg  IV in admitting area pre-procedure.  Verified allergies with pt; no Benadryl allergy.  Benadryl 12.5mg  IV given at 1045,  Additional 12.5mg  IV given at 1049. Karl Bales RN

## 2011-05-04 NOTE — Progress Notes (Signed)
Patient stating she has a high tolerance to pain medications. Patient stating she is very nervous, taking a Klonopin this am and Vistaril for sleep last evening. Pretreated in admitting and additional Benadryl ordered during procedure. Patient comfortable once cecum reached and actually sleeping at 1202.

## 2011-05-05 ENCOUNTER — Telehealth: Payer: Self-pay

## 2011-05-05 NOTE — Telephone Encounter (Signed)
Left message on answering machine. 

## 2011-05-06 ENCOUNTER — Emergency Department (HOSPITAL_COMMUNITY)
Admission: EM | Admit: 2011-05-06 | Discharge: 2011-05-07 | Disposition: A | Discharge status: Home / Self Care | Payer: Medicare Other | Attending: Emergency Medicine | Admitting: Emergency Medicine

## 2011-05-06 DIAGNOSIS — F3289 Other specified depressive episodes: Secondary | ICD-10-CM | POA: Insufficient documentation

## 2011-05-06 DIAGNOSIS — E669 Obesity, unspecified: Secondary | ICD-10-CM | POA: Insufficient documentation

## 2011-05-06 DIAGNOSIS — R143 Flatulence: Secondary | ICD-10-CM | POA: Insufficient documentation

## 2011-05-06 DIAGNOSIS — Z79899 Other long term (current) drug therapy: Secondary | ICD-10-CM | POA: Insufficient documentation

## 2011-05-06 DIAGNOSIS — F329 Major depressive disorder, single episode, unspecified: Secondary | ICD-10-CM | POA: Insufficient documentation

## 2011-05-06 DIAGNOSIS — R141 Gas pain: Secondary | ICD-10-CM | POA: Insufficient documentation

## 2011-05-06 DIAGNOSIS — E119 Type 2 diabetes mellitus without complications: Secondary | ICD-10-CM | POA: Insufficient documentation

## 2011-05-06 DIAGNOSIS — R142 Eructation: Secondary | ICD-10-CM | POA: Insufficient documentation

## 2011-05-06 DIAGNOSIS — R110 Nausea: Secondary | ICD-10-CM | POA: Insufficient documentation

## 2011-05-06 DIAGNOSIS — R51 Headache: Secondary | ICD-10-CM | POA: Insufficient documentation

## 2011-05-06 DIAGNOSIS — G8929 Other chronic pain: Secondary | ICD-10-CM | POA: Insufficient documentation

## 2011-05-06 DIAGNOSIS — K589 Irritable bowel syndrome without diarrhea: Secondary | ICD-10-CM | POA: Insufficient documentation

## 2011-05-06 DIAGNOSIS — R109 Unspecified abdominal pain: Secondary | ICD-10-CM | POA: Insufficient documentation

## 2011-05-10 ENCOUNTER — Other Ambulatory Visit: Payer: Self-pay | Admitting: Internal Medicine

## 2011-05-10 DIAGNOSIS — Z1231 Encounter for screening mammogram for malignant neoplasm of breast: Secondary | ICD-10-CM

## 2011-05-11 ENCOUNTER — Encounter: Payer: Self-pay | Admitting: Internal Medicine

## 2011-05-30 ENCOUNTER — Ambulatory Visit: Payer: Medicare Other | Admitting: Internal Medicine

## 2011-06-01 ENCOUNTER — Ambulatory Visit (HOSPITAL_COMMUNITY)
Admission: RE | Admit: 2011-06-01 | Discharge: 2011-06-01 | Disposition: A | Discharge status: Home / Self Care | Payer: Medicare Other | Source: Ambulatory Visit | Attending: Internal Medicine | Admitting: Internal Medicine

## 2011-06-01 DIAGNOSIS — Z1231 Encounter for screening mammogram for malignant neoplasm of breast: Secondary | ICD-10-CM

## 2011-06-05 ENCOUNTER — Telehealth: Payer: Self-pay | Admitting: *Deleted

## 2011-06-05 NOTE — Telephone Encounter (Signed)
PHARM FAXED REQ - Clonazepam 1 mg 1-3 tabs qd prn #90.

## 2011-06-06 MED ORDER — CLONAZEPAM 1 MG PO TABS: 1.0000 mg | ORAL_TABLET | Freq: Three times a day (TID) | ORAL | Status: DC | PRN

## 2011-06-06 NOTE — Telephone Encounter (Signed)
OK to fill this prescription with additional refills x3 Thank you!  

## 2011-06-06 NOTE — Telephone Encounter (Signed)
Called in.

## 2011-06-20 LAB — BASIC METABOLIC PANEL
BUN: 7
CO2: 24
GFR calc non Af Amer: >60
Glucose, Bld: 148 — ABNORMAL HIGH
Potassium: 4.1

## 2011-06-20 LAB — CBC
HCT: 47.2 — ABNORMAL HIGH
Hemoglobin: 14.9
MCHC: 31.5
Platelets: 288
RDW: 12.8

## 2011-06-20 LAB — DIFFERENTIAL
Basophils Absolute: 0
Basophils Relative: 0
Eosinophils Absolute: 0.3
Eosinophils Relative: 2
Lymphocytes Relative: 29
Monocytes Absolute: 0.5

## 2011-06-20 LAB — POCT CARDIAC MARKERS: CKMB, poc: <1 — ABNORMAL LOW

## 2011-06-20 LAB — D-DIMER, QUANTITATIVE: D-Dimer, Quant: 0.34

## 2011-06-22 ENCOUNTER — Ambulatory Visit (INDEPENDENT_AMBULATORY_CARE_PROVIDER_SITE_OTHER): Payer: Medicare Other | Admitting: Psychology

## 2011-06-22 DIAGNOSIS — F331 Major depressive disorder, recurrent, moderate: Secondary | ICD-10-CM

## 2011-06-23 LAB — GLUCOSE, CAPILLARY

## 2011-06-23 LAB — CLOTEST (H. PYLORI), BIOPSY: Helicobacter screen: NEGATIVE

## 2011-07-03 ENCOUNTER — Telehealth: Payer: Self-pay | Admitting: Internal Medicine

## 2011-07-03 ENCOUNTER — Other Ambulatory Visit (INDEPENDENT_AMBULATORY_CARE_PROVIDER_SITE_OTHER): Payer: Medicare Other

## 2011-07-03 ENCOUNTER — Encounter: Payer: Self-pay | Admitting: Internal Medicine

## 2011-07-03 ENCOUNTER — Ambulatory Visit (INDEPENDENT_AMBULATORY_CARE_PROVIDER_SITE_OTHER): Payer: Medicare Other | Admitting: Internal Medicine

## 2011-07-03 VITALS — BP 120/80 | HR 88 | Temp 97.8°F | Resp 16 | Wt 253.0 lb

## 2011-07-03 DIAGNOSIS — F329 Major depressive disorder, single episode, unspecified: Secondary | ICD-10-CM

## 2011-07-03 DIAGNOSIS — G609 Hereditary and idiopathic neuropathy, unspecified: Secondary | ICD-10-CM

## 2011-07-03 DIAGNOSIS — IMO0001 Reserved for inherently not codable concepts without codable children: Secondary | ICD-10-CM

## 2011-07-03 DIAGNOSIS — E119 Type 2 diabetes mellitus without complications: Secondary | ICD-10-CM

## 2011-07-03 DIAGNOSIS — Z23 Encounter for immunization: Secondary | ICD-10-CM

## 2011-07-03 DIAGNOSIS — K219 Gastro-esophageal reflux disease without esophagitis: Secondary | ICD-10-CM

## 2011-07-03 LAB — URINALYSIS
Bilirubin Urine: NEGATIVE
Hgb urine dipstick: NEGATIVE
Total Protein, Urine: NEGATIVE
Urine Glucose: NEGATIVE
Urobilinogen, UA: 0.2 (ref 0.0–1.0)

## 2011-07-03 LAB — HEMOGLOBIN A1C: Hgb A1c MFr Bld: 8 % — ABNORMAL HIGH (ref 4.6–6.5)

## 2011-07-03 LAB — BASIC METABOLIC PANEL
BUN: 13 mg/dL (ref 6–23)
CO2: 28 mEq/L (ref 19–32)
Calcium: 9 mg/dL (ref 8.4–10.5)
Chloride: 102 mEq/L (ref 96–112)
Creatinine, Ser: 0.8 mg/dL (ref 0.4–1.2)

## 2011-07-03 MED ORDER — NORTRIPTYLINE HCL 50 MG PO CAPS: 50.0000 mg | ORAL_CAPSULE | Freq: Every day | ORAL | Status: DC

## 2011-07-03 MED ORDER — METRONIDAZOLE 0.75 % VA GEL: 1.0000 | Freq: Two times a day (BID) | VAGINAL | Status: DC

## 2011-07-03 MED ORDER — ESTROGENS, CONJUGATED 0.625 MG/GM VA CREA: TOPICAL_CREAM | Freq: Every day | VAGINAL | Status: DC

## 2011-07-03 MED ORDER — TRIAMCINOLONE ACETONIDE 0.1 % EX CREA: TOPICAL_CREAM | Freq: Three times a day (TID) | CUTANEOUS | Status: AC

## 2011-07-03 MED ORDER — ESTROGENS CONJUGATED 0.625 MG PO TABS: 0.6250 mg | ORAL_TABLET | Freq: Every day | ORAL | Status: DC

## 2011-07-03 MED ORDER — HYDROCODONE-ACETAMINOPHEN 10-660 MG PO TABS: 1.0000 | ORAL_TABLET | Freq: Four times a day (QID) | ORAL | Status: DC | PRN

## 2011-07-03 MED ORDER — PROMETHAZINE HCL 25 MG PO TABS: ORAL_TABLET | ORAL | Status: DC

## 2011-07-03 MED ORDER — GLUCOSE BLOOD VI STRP: ORAL_STRIP | Status: DC

## 2011-07-03 MED ORDER — TIZANIDINE HCL 4 MG PO TABS: 4.0000 mg | ORAL_TABLET | Freq: Three times a day (TID) | ORAL | Status: DC | PRN

## 2011-07-03 NOTE — Telephone Encounter (Signed)
[name], please, inform patient that all labs are ok.  Please, mail the labs to the patient.    AP

## 2011-07-03 NOTE — Assessment & Plan Note (Signed)
See meds - Pristique was stopped

## 2011-07-03 NOTE — Progress Notes (Signed)
  Subjective:    Patient ID: [name], female    DOB: 06-07-1956, 55 y.o.   MRN: [id number]  HPI   The patient is here to follow up on chronic depression, anxiety, headaches and chronic moderate fibromyalgia symptoms controlled with medicines, diet and exercise. C/o GI problems w/Pristique - stopped.  Wt Readings from Last 3 Encounters:  07/03/11 253 lb (114.76 kg)  05/04/11 246 lb (111.585 kg)  03/09/11 246 lb (111.585 kg)     Review of Systems  Constitutional: Positive for fatigue. Negative for chills, activity change, appetite change and unexpected weight change.  HENT: Negative for congestion, mouth sores and sinus pressure.   Eyes: Negative for visual disturbance.  Respiratory: Negative for cough and chest tightness.   Gastrointestinal: Negative for nausea and abdominal pain.  Genitourinary: Negative for frequency, difficulty urinating and vaginal pain.  Musculoskeletal: Positive for myalgias, back pain and arthralgias. Negative for gait problem.  Skin: Negative for pallor and rash.  Neurological: Negative for dizziness, tremors, weakness, numbness and headaches.  Psychiatric/Behavioral: Positive for decreased concentration. Negative for suicidal ideas, confusion and sleep disturbance. The patient is nervous/anxious.        Objective:   Physical Exam  Constitutional: She appears well-developed. No distress.       Obese  HENT:  Head: Normocephalic.  Right Ear: External ear normal.  Left Ear: External ear normal.  Nose: Nose normal.  Mouth/Throat: Oropharynx is clear and moist.  Eyes: Conjunctivae are normal. Pupils are equal, round, and reactive to light. Right eye exhibits no discharge. Left eye exhibits no discharge.  Neck: Normal range of motion. Neck supple. No JVD present. No tracheal deviation present. No thyromegaly present.  Cardiovascular: Normal rate, regular rhythm and normal heart sounds.   Pulmonary/Chest: No stridor. No respiratory distress. She has no  wheezes.  Abdominal: Soft. Bowel sounds are normal. She exhibits no distension and no mass. There is no tenderness. There is no rebound and no guarding.  Musculoskeletal: She exhibits no edema and no tenderness.  Lymphadenopathy:    She has no cervical adenopathy.  Neurological: She displays normal reflexes. No cranial nerve deficit. She exhibits normal muscle tone. Coordination normal.  Skin: No rash noted. No erythema.  Psychiatric: Her behavior is normal. Judgment and thought content normal.       depressed          Assessment & Plan:

## 2011-07-03 NOTE — Assessment & Plan Note (Signed)
Chronic  Potential benefits of a long term opioids use as well as potential risks (i.e. addiction risk, apnea etc) and complications (i.e. Somnolence, constipation and others) were explained to the patient and were aknowledged. Worse off Pristique 10/12

## 2011-07-03 NOTE — Assessment & Plan Note (Signed)
Continue with current prescription therapy as reflected on the Med list.  

## 2011-07-04 NOTE — Telephone Encounter (Signed)
Left detailed mess informing pt of below. Copies mailed.  

## 2011-07-04 NOTE — Assessment & Plan Note (Signed)
Continue with current prescription therapy as reflected on the Med list.  

## 2011-07-04 NOTE — Assessment & Plan Note (Signed)
Doing well 

## 2011-07-16 ENCOUNTER — Emergency Department (HOSPITAL_COMMUNITY)
Admission: EM | Admit: 2011-07-16 | Discharge: 2011-07-17 | Disposition: A | Discharge status: Home / Self Care | Payer: Medicare Other | Attending: Emergency Medicine | Admitting: Emergency Medicine

## 2011-07-16 DIAGNOSIS — G43909 Migraine, unspecified, not intractable, without status migrainosus: Secondary | ICD-10-CM | POA: Insufficient documentation

## 2011-07-16 DIAGNOSIS — R079 Chest pain, unspecified: Secondary | ICD-10-CM | POA: Insufficient documentation

## 2011-07-16 DIAGNOSIS — F329 Major depressive disorder, single episode, unspecified: Secondary | ICD-10-CM | POA: Insufficient documentation

## 2011-07-16 DIAGNOSIS — R1084 Generalized abdominal pain: Secondary | ICD-10-CM | POA: Insufficient documentation

## 2011-07-16 DIAGNOSIS — R112 Nausea with vomiting, unspecified: Secondary | ICD-10-CM | POA: Insufficient documentation

## 2011-07-16 DIAGNOSIS — IMO0001 Reserved for inherently not codable concepts without codable children: Secondary | ICD-10-CM | POA: Insufficient documentation

## 2011-07-16 DIAGNOSIS — K589 Irritable bowel syndrome without diarrhea: Secondary | ICD-10-CM | POA: Insufficient documentation

## 2011-07-16 DIAGNOSIS — J449 Chronic obstructive pulmonary disease, unspecified: Secondary | ICD-10-CM | POA: Insufficient documentation

## 2011-07-16 DIAGNOSIS — E559 Vitamin D deficiency, unspecified: Secondary | ICD-10-CM | POA: Insufficient documentation

## 2011-07-16 DIAGNOSIS — F411 Generalized anxiety disorder: Secondary | ICD-10-CM | POA: Insufficient documentation

## 2011-07-16 DIAGNOSIS — J4489 Other specified chronic obstructive pulmonary disease: Secondary | ICD-10-CM | POA: Insufficient documentation

## 2011-07-16 DIAGNOSIS — F3289 Other specified depressive episodes: Secondary | ICD-10-CM | POA: Insufficient documentation

## 2011-07-16 DIAGNOSIS — R197 Diarrhea, unspecified: Secondary | ICD-10-CM | POA: Insufficient documentation

## 2011-07-16 DIAGNOSIS — Z79899 Other long term (current) drug therapy: Secondary | ICD-10-CM | POA: Insufficient documentation

## 2011-07-16 DIAGNOSIS — E119 Type 2 diabetes mellitus without complications: Secondary | ICD-10-CM | POA: Insufficient documentation

## 2011-07-17 ENCOUNTER — Emergency Department (HOSPITAL_COMMUNITY): Payer: Medicare Other

## 2011-07-17 LAB — DIFFERENTIAL
Basophils Absolute: 0 10*3/uL (ref 0.0–0.1)
Basophils Relative: 0 % (ref 0–1)
Eosinophils Absolute: 0 10*3/uL (ref 0.0–0.7)
Lymphocytes Relative: 14 % (ref 12–46)
Monocytes Absolute: 0.4 10*3/uL (ref 0.1–1.0)
Neutro Abs: 10.5 10*3/uL — ABNORMAL HIGH (ref 1.7–7.7)
Neutrophils Relative %: 83 % — ABNORMAL HIGH (ref 43–77)

## 2011-07-17 LAB — COMPREHENSIVE METABOLIC PANEL
Albumin: 3.3 g/dL — ABNORMAL LOW (ref 3.5–5.2)
Alkaline Phosphatase: 63 U/L (ref 39–117)
BUN: 14 mg/dL (ref 6–23)
Calcium: 8.1 mg/dL — ABNORMAL LOW (ref 8.4–10.5)
Creatinine, Ser: 0.79 mg/dL (ref 0.50–1.10)
GFR calc Af Amer: >90 mL/min (ref >90)
Glucose, Bld: 184 mg/dL — ABNORMAL HIGH (ref 70–99)
Total Protein: 6.3 g/dL (ref 6.0–8.3)

## 2011-07-17 LAB — URINALYSIS, ROUTINE W REFLEX MICROSCOPIC
Bilirubin Urine: NEGATIVE
Leukocytes, UA: NEGATIVE
Nitrite: NEGATIVE
Specific Gravity, Urine: 1.024 (ref 1.005–1.030)
Urobilinogen, UA: 0.2 mg/dL (ref 0.0–1.0)
pH: 5 (ref 5.0–8.0)

## 2011-07-17 LAB — CBC
MCH: 29.4 pg (ref 26.0–34.0)
MCV: 86.8 fL (ref 78.0–100.0)
Platelets: 189 10*3/uL (ref 150–400)
RDW: 11.8 % (ref 11.5–15.5)

## 2011-07-17 LAB — URINE MICROSCOPIC-ADD ON

## 2011-07-17 LAB — POCT I-STAT TROPONIN I: Troponin i, poc: 0 ng/mL (ref 0.00–0.08)

## 2011-07-17 LAB — LIPASE, BLOOD: Lipase: 45 U/L (ref 11–59)

## 2011-07-18 ENCOUNTER — Telehealth: Payer: Self-pay

## 2011-07-18 LAB — URINE CULTURE

## 2011-07-18 NOTE — Telephone Encounter (Signed)
Patient called triage LMOVM c/o cough, wheezing and congestion. She was seen at ER on 07/16/11 and told fluid around lungs and sent home w/o antibiotic. Patient feel as though she was not examined completely is requesting that MD look over xray done in ER and possibly send rx for antibiotic. Per pt, Levaquin has worked really well in the past

## 2011-07-18 NOTE — Telephone Encounter (Signed)
Possible L lung pneumonia on CXR. OV w/any MD if not well. Thx

## 2011-07-19 ENCOUNTER — Telehealth: Payer: Self-pay | Admitting: Internal Medicine

## 2011-07-19 ENCOUNTER — Ambulatory Visit (INDEPENDENT_AMBULATORY_CARE_PROVIDER_SITE_OTHER)
Admission: RE | Admit: 2011-07-19 | Discharge: 2011-07-19 | Disposition: A | Discharge status: Home / Self Care | Payer: Medicare Other | Source: Ambulatory Visit | Attending: Endocrinology | Admitting: Endocrinology

## 2011-07-19 ENCOUNTER — Ambulatory Visit (INDEPENDENT_AMBULATORY_CARE_PROVIDER_SITE_OTHER): Payer: Medicare Other | Admitting: Endocrinology

## 2011-07-19 VITALS — BP 138/90 | HR 72 | Temp 98.3°F | Wt 256.4 lb

## 2011-07-19 DIAGNOSIS — R918 Other nonspecific abnormal finding of lung field: Secondary | ICD-10-CM

## 2011-07-19 NOTE — Telephone Encounter (Signed)
Patient came up to our lobby and requested an OV to f/u since she went to the ER on 07/16/11 for abdominal pain and bloating. She is seeing her PCP today for breathing problems. Scheduled patient to see [name], PA on 07/20/11 at 10:00 AM

## 2011-07-19 NOTE — Telephone Encounter (Signed)
Left a message for patient to call me. 

## 2011-07-19 NOTE — Patient Instructions (Addendum)
Let's recheck the chest-x-ray today.  please call [phone] to hear your test results.  You will be prompted to enter the 9-digit "MRN" number that appears at the top left of this page, followed by #.  Then you will hear the message. I hope you feel better soon.  If you don't feel better by next week, please call dr plotnikov.   (update: i left message on phone-tree:  rx as we discussed)

## 2011-07-19 NOTE — Progress Notes (Signed)
Subjective:    Patient ID: [name], female    DOB: 12-28-1955, 55 y.o.   MRN: [id number]  HPI Pt has long h/o gi sxs.  She has had the most recent episode x a few days: severe abdominal pain, and assoc diarrhea.  cxr was suggestive of pneumonia.  She has doe (which she attributes to abdominal distention).  She was rx'ed reglan, percocet, and carafate.  Pt says she can tolerate avelox.   Past Medical History  Diagnosis Date  . Asthma   . COPD (chronic obstructive pulmonary disease)   . Depression   . GERD (gastroesophageal reflux disease)     Dr Juanda Chance  . Gastroparesis   . Migraines   . Menopause   . IBS (irritable bowel syndrome)   . Diabetes mellitus type II   . Peripheral neuropathy   . Fibromyalgia   . Obesity   . Barrett's esophagus   . MRSA (methicillin resistant Staphylococcus aureus)     possible (by verbal report from pt)  . OSA (obstructive sleep apnea)   . History of hiatal hernia     Past Surgical History  Procedure Date  . Abdominal hysterectomy     Complete  . Sinus surgery with instatrak     x3  . Surgery left heel spur   . Appendectomy   . Ventral hernia repair     with mesh  . Cholecystectomy   . Nissen fundoplication 1999  . Mouth surgery     History   Social History  . Marital Status: Married    Spouse Name: N/A    Number of Children: 3  . Years of Education: N/A   Occupational History  . CMA disabled    Social History Main Topics  . Smoking status: Former Smoker    Quit date: 12/24/2009  . Smokeless tobacco: Not on file   Comment: Stopped April 2011  . Alcohol Use: No     occasional (1-2 times per month)  . Drug Use: No  . Sexually Active: Not on file   Other Topics Concern  . Not on file   Social History Narrative  . No narrative on file    Current Outpatient Prescriptions on File Prior to Visit  Medication Sig Dispense Refill  . albuterol (PROAIR HFA) 108 (90 BASE) MCG/ACT inhaler Inhale 2 puffs into the lungs every  4 (four) hours as needed.        . cetirizine (ZYRTEC HIVES RELIEF) 10 MG tablet Take 10 mg by mouth. As needed      . clonazePAM (KLONOPIN) 1 MG tablet Take 1 tablet (1 mg total) by mouth 3 (three) times daily as needed.  90 tablet  3  . conjugated estrogens (PREMARIN) vaginal cream Place vaginally daily. Use pv 2-3/wk  42.5 g  12  . cycloSPORINE (RESTASIS) 0.05 % ophthalmic emulsion Place 1 drop into both eyes daily.        Marland Kitchen dicyclomine (BENTYL) 20 MG tablet Take 1 tablet (20 mg total) by mouth 3 (three) times daily as needed.  90 tablet  0  . esomeprazole (NEXIUM) 40 MG capsule Take 40 mg by mouth daily before breakfast.        . furosemide (LASIX) 20 MG tablet Take 20 mg by mouth daily. As needed       . glucose blood test strip Use as instructed  100 each  3  . Hydrocodone-Acetaminophen 10-660 MG TABS Take 1 tablet by mouth 4 (four) times daily as  needed (for pain).  120 each  2  . hydrOXYzine (VISTARIL) 50 MG capsule Take 50 mg by mouth once. 2 tab by mouth as needed for insomnia      . hyoscyamine (LEVSIN SL) 0.125 MG SL tablet Place 0.125 mg under the tongue every 4 (four) hours as needed.        Marland Kitchen ipratropium-albuterol (DUONEB) 0.5-2.5 (3) MG/3ML SOLN USE 1 VIAL IN NEBULIZER 4 TIMES DAILY AS NEEDED  180 mL  0  . metFORMIN (GLUCOPHAGE) 500 MG tablet TAKE 2 TABLETS BY MOUTH TWICE A DAY  120 tablet  3  . Probiotic Product (ALIGN) 4 MG CAPS Take 4 mg by mouth as needed. Daily      . promethazine (PHENERGAN) 25 MG tablet Take 1-2 tablets every 6 hours prn  90 tablet  0  . tiZANidine (ZANAFLEX) 4 MG tablet Take 1 tablet (4 mg total) by mouth every 8 (eight) hours as needed.  90 tablet  1  . triamcinolone (KENALOG) 0.1 % cream Apply topically 3 (three) times daily. As needed for rash  45 g  1  . metroNIDAZOLE (METROGEL) 0.75 % vaginal gel Place 1 Applicatorful vaginally 2 (two) times daily.  70 g  1  . nortriptyline (PAMELOR) 50 MG capsule Take 1-2 capsules (50-100 mg total) by mouth at  bedtime.  60 capsule  5  . oxyCODONE-acetaminophen (PERCOCET) 5-325 MG per tablet Take 1 tablet by mouth every 6 (six) hours as needed for pain. Take 1-2 tablets by mouth every 6 hours as needed for pain  15 tablet  0    Allergies  Allergen Reactions  . Ciprofloxacin     sensitivity  . Duloxetine     REACTION: diarrhea  . Hydromorphone Hcl   . Iohexol      Code: HIVES, Desc: PT developed 3 hives post injection of Omni 300. Observed by Dr Clifton James. Given 50 mg PO benedryl.Hives subsided before being released., Onset Date: [id number]   . WJX:BJYNWGNFAOZ+HYQMVHQIO+NGEXBMWUXL Acid+Aspartame     REACTION: diarrhea  . Ketorolac Tromethamine   . Moxifloxacin   . Penicillins     sensitivity  . Pioglitazone     REACTION: weight gain  . Pregabalin     REACTION: anaphylaxis  . Pristiq (Desvenlafaxine Succinate Monohydrate)     abd pains  . Quetiapine     REACTION: nightmares  . Topiramate     Family History  Problem Relation Age of Onset  . Diabetes Mother   . Lung cancer Mother   . Cancer Mother     lung  . Esophageal cancer Father   . Heart disease Father   . Cancer Father     esophageal  . Diabetes Father   . Colon polyps Father     Family History  . Breast cancer Maternal Grandmother   . Heart disease Maternal Grandmother   . Diabetes Maternal Grandmother   . Cancer Maternal Grandmother     breast, colon  . Heart attack Brother 55  . Colon cancer Maternal Grandfather   . Colon polyps Maternal Grandfather   . Diabetes Maternal Grandfather   . Colon polyps Paternal Grandfather   . Colon cancer Paternal Grandfather   . Breast cancer Maternal Aunt   . Arthritis    . Hypertension    . Uterine cancer Other     great grandmother    BP 138/90  Pulse 72  Temp(Src) 98.3 F (36.8 C) (Oral)  Wt 256 lb 6.4 oz (116.302 kg)  SpO2 97%    Review of Systems She has nausea, but no vomiting. No fever, but she has excessive diaphoresis.  She denies cough.      Objective:   Physical Exam VITAL SIGNS:  See vs page GENERAL: no distress LUNGS:  Clear to auscultation ABDOMEN:  abdomen is soft, nontender.  no hepatosplenomegaly.   Diffusely distended.  no hernia.    Cxr: nad    Assessment & Plan:  cxr infiltrate, resolved Gi sxs, acute + chronic, improved Htn, mild, prob with a situational component

## 2011-07-20 ENCOUNTER — Ambulatory Visit: Payer: Medicare Other | Admitting: Physician Assistant

## 2011-07-20 ENCOUNTER — Telehealth (INDEPENDENT_AMBULATORY_CARE_PROVIDER_SITE_OTHER): Payer: Self-pay | Admitting: General Surgery

## 2011-07-20 NOTE — Telephone Encounter (Signed)
The patient contacted the office and spoke with Germaine. She is needing direction on where to go from here. States she has been seen in the ER 5 times in the last 6 months was last seen Sunday, Her PCP and GI could not explain her intermittent pain. The patient is questioning if it could be the mesh that was used during her hernia surgery in 2006. Has had several CTs that do not explain her symptoms.

## 2011-07-21 ENCOUNTER — Ambulatory Visit: Payer: Medicare Other | Admitting: Physician Assistant

## 2011-07-21 ENCOUNTER — Telehealth: Payer: Self-pay | Admitting: *Deleted

## 2011-07-21 ENCOUNTER — Ambulatory Visit (INDEPENDENT_AMBULATORY_CARE_PROVIDER_SITE_OTHER): Payer: BC Managed Care – PPO | Admitting: Psychology

## 2011-07-21 DIAGNOSIS — F331 Major depressive disorder, recurrent, moderate: Secondary | ICD-10-CM

## 2011-07-21 NOTE — Telephone Encounter (Signed)
Pt left vm req call back. She states she cant take antidepressant Dr. Dellia Cloud gave her due to GI side effects and is requesting appt with Dr. Posey Rea for next week.    I called pt- no answer  Left mess for patient to call back.

## 2011-07-24 NOTE — Telephone Encounter (Signed)
Pt scheduled 07-26-11 with PCP.

## 2011-07-26 ENCOUNTER — Encounter: Payer: Self-pay | Admitting: Internal Medicine

## 2011-07-26 ENCOUNTER — Ambulatory Visit (INDEPENDENT_AMBULATORY_CARE_PROVIDER_SITE_OTHER): Payer: Medicare Other | Admitting: Internal Medicine

## 2011-07-26 DIAGNOSIS — K589 Irritable bowel syndrome without diarrhea: Secondary | ICD-10-CM

## 2011-07-26 DIAGNOSIS — K3184 Gastroparesis: Secondary | ICD-10-CM

## 2011-07-26 DIAGNOSIS — R197 Diarrhea, unspecified: Secondary | ICD-10-CM

## 2011-07-26 DIAGNOSIS — G43909 Migraine, unspecified, not intractable, without status migrainosus: Secondary | ICD-10-CM

## 2011-07-26 DIAGNOSIS — G4730 Sleep apnea, unspecified: Secondary | ICD-10-CM

## 2011-07-26 DIAGNOSIS — E119 Type 2 diabetes mellitus without complications: Secondary | ICD-10-CM

## 2011-07-26 DIAGNOSIS — IMO0001 Reserved for inherently not codable concepts without codable children: Secondary | ICD-10-CM

## 2011-07-26 MED ORDER — METOCLOPRAMIDE HCL 10 MG PO TABS: 5.0000 mg | ORAL_TABLET | Freq: Four times a day (QID) | ORAL | Status: DC

## 2011-07-26 MED ORDER — ESCITALOPRAM OXALATE 20 MG PO TABS: 10.0000 mg | ORAL_TABLET | Freq: Every day | ORAL | Status: DC

## 2011-07-26 NOTE — Assessment & Plan Note (Signed)
Continue with current prescription therapy as reflected on the Med list.  

## 2011-07-26 NOTE — Assessment & Plan Note (Signed)
On CPAP. ?

## 2011-07-26 NOTE — Assessment & Plan Note (Signed)
Continue with current prescription therapy as reflected on the Med list. We can try a gluten free diet

## 2011-07-26 NOTE — Patient Instructions (Signed)
Try gluten free diet x 1-2 months

## 2011-07-26 NOTE — Progress Notes (Signed)
  Subjective:    Patient ID: [name], female    DOB: 04-09-1956, 55 y.o.   MRN: [id number]  HPI   The patient is here to follow up on chronic IBS, depression, anxiety, headaches and chronic moderate fibromyalgia symptoms controlled partially with medicines, diet and exercise. She had another attack of abd pain a few days ago (#5). The only Rx that was helping - was Reglan   Review of Systems  Constitutional: Positive for fatigue. Negative for chills, activity change, appetite change and unexpected weight change.  HENT: Negative for congestion, mouth sores and sinus pressure.   Eyes: Negative for visual disturbance.  Respiratory: Negative for cough and chest tightness.   Gastrointestinal: Positive for nausea, abdominal pain and abdominal distention.  Genitourinary: Negative for frequency, hematuria, difficulty urinating, vaginal pain and pelvic pain.  Musculoskeletal: Positive for back pain and arthralgias. Negative for gait problem.  Skin: Negative for pallor and rash.  Neurological: Negative for dizziness, tremors, weakness, numbness and headaches.  Psychiatric/Behavioral: Positive for behavioral problems. Negative for suicidal ideas, confusion and sleep disturbance. The patient is nervous/anxious.        Objective:   Physical Exam  Constitutional: She appears well-developed. No distress.       Obese, NAD  HENT:  Head: Normocephalic.  Right Ear: External ear normal.  Left Ear: External ear normal.  Nose: Nose normal.  Mouth/Throat: Oropharynx is clear and moist.  Eyes: Conjunctivae are normal. Pupils are equal, round, and reactive to light. Right eye exhibits no discharge. Left eye exhibits no discharge.  Neck: Normal range of motion. Neck supple. No JVD present. No tracheal deviation present. No thyromegaly present.  Cardiovascular: Normal rate, regular rhythm and normal heart sounds.   Pulmonary/Chest: No stridor. No respiratory distress. She has no wheezes.    Abdominal: Soft. Bowel sounds are normal. She exhibits no distension and no mass. There is no tenderness. There is no rebound and no guarding.  Musculoskeletal: She exhibits tenderness. She exhibits no edema.       LS spine is tender  Lymphadenopathy:    She has no cervical adenopathy.  Neurological: She displays normal reflexes. No cranial nerve deficit. She exhibits normal muscle tone. Coordination normal.  Skin: No rash noted. No erythema.  Psychiatric: Her behavior is normal. Judgment and thought content normal. Her mood appears anxious. Her affect is labile. Her affect is not blunt. She is is not hyperactive and not combative. Thought content is not paranoid. She exhibits a depressed mood. She expresses no suicidal plans and no homicidal plans. She exhibits normal recent memory.    Lab Results  Component Value Date   WBC 12.7* 07/17/2011   HGB 13.3 07/17/2011   HCT 39.3 07/17/2011   PLT 189 07/17/2011   GLUCOSE 184* 07/17/2011   CHOL 167 07/26/2010   TRIG 332.0* 07/26/2010   HDL 43.10 07/26/2010   LDLDIRECT 80.3 07/26/2010   ALT 13 07/17/2011   AST 15 07/17/2011   NA 140 07/17/2011   K 4.1 07/17/2011   CL 107 07/17/2011   CREATININE 0.79 07/17/2011   BUN 14 07/17/2011   CO2 26 07/17/2011   TSH 3.68 07/26/2010   HGBA1C 8.0* 07/03/2011   MICROALBUR 0.4 07/26/2010         Assessment & Plan:

## 2011-07-28 ENCOUNTER — Encounter (INDEPENDENT_AMBULATORY_CARE_PROVIDER_SITE_OTHER): Payer: Self-pay | Admitting: Surgery

## 2011-07-28 ENCOUNTER — Ambulatory Visit (INDEPENDENT_AMBULATORY_CARE_PROVIDER_SITE_OTHER): Payer: Medicare Other | Admitting: Surgery

## 2011-07-28 NOTE — Assessment & Plan Note (Signed)
Daizha has a BMI of 41. 2 goals for her would be to try to get off as many of the medicines that she is on him to have her lose some of her central pedal obesity. About one a low carbohydrate diet for her and we'll see her back in 4 weeks.

## 2011-07-28 NOTE — Patient Instructions (Signed)
Follow the low carb diet we talked about.  See  Amy Henreitta Leber regarding the preoperative bariatric diet.

## 2011-07-28 NOTE — Progress Notes (Signed)
[name] comes in today regarding her recurrent bouts of bloating diarrhea abdominal pain. Because of these she is in wanting up in the emergency room where the frequently. Since her Nissen fundoplication that I did back in and about in 1999 she has had no problems with reflux and does say that she can't vomit. Also repaired a umbilical hernia in February of 06 and used a ventral X. Patch. This is not the Kugel patch as it has no pain but had Marlex and Gore-Tex.  I spent some time discussing a low-carb type diet with her to try do get her BMI of 40 one down. We got a get her off her sugars and carbs. I gave her some information on bariatric surgery although I think acute dietary intervention should be done. I would like to get her off some of the many meds that she takes N. See her back in the office in about 4 weeks.  Regarding the diarrhea it may be that a trial of Questran would be helpful.  Plan return 4 weeks

## 2011-07-31 ENCOUNTER — Ambulatory Visit (INDEPENDENT_AMBULATORY_CARE_PROVIDER_SITE_OTHER): Payer: Medicare Other | Admitting: Psychology

## 2011-07-31 ENCOUNTER — Encounter: Payer: Self-pay | Admitting: Internal Medicine

## 2011-07-31 DIAGNOSIS — F331 Major depressive disorder, recurrent, moderate: Secondary | ICD-10-CM

## 2011-08-14 ENCOUNTER — Ambulatory Visit (INDEPENDENT_AMBULATORY_CARE_PROVIDER_SITE_OTHER): Payer: 59 | Admitting: Psychology

## 2011-08-14 ENCOUNTER — Telehealth (INDEPENDENT_AMBULATORY_CARE_PROVIDER_SITE_OTHER): Payer: Self-pay

## 2011-08-14 DIAGNOSIS — F331 Major depressive disorder, recurrent, moderate: Secondary | ICD-10-CM

## 2011-08-14 NOTE — Telephone Encounter (Signed)
The patients states that when she was in to see Dr Daphine Deutscher last, he recommended a GI referral.  He was going to forward her note to them.  She called Dr Buccini's office but they didn't get any notes.  I told her I would ask French Ana to check in to this and call her back .

## 2011-08-15 ENCOUNTER — Telehealth (INDEPENDENT_AMBULATORY_CARE_PROVIDER_SITE_OTHER): Payer: Self-pay | Admitting: General Surgery

## 2011-08-15 NOTE — Telephone Encounter (Signed)
Tried to contact the patient to advise her that Dr Daphine Deutscher sent her office note to Dr Melina Fiddler, not Dr Clent Ridges.she will need to contact them regarding her appt.

## 2011-08-16 ENCOUNTER — Telehealth: Payer: Self-pay | Admitting: Internal Medicine

## 2011-08-16 NOTE — Telephone Encounter (Signed)
Pt called to let us know she is trying to find another GI doctor. She has signed a release for her records to be sent to another GI physician.

## 2011-08-30 ENCOUNTER — Ambulatory Visit (INDEPENDENT_AMBULATORY_CARE_PROVIDER_SITE_OTHER): Payer: 59 | Admitting: Psychology

## 2011-08-30 DIAGNOSIS — F331 Major depressive disorder, recurrent, moderate: Secondary | ICD-10-CM

## 2011-10-02 ENCOUNTER — Other Ambulatory Visit: Payer: Self-pay | Admitting: Internal Medicine

## 2011-10-02 ENCOUNTER — Other Ambulatory Visit (INDEPENDENT_AMBULATORY_CARE_PROVIDER_SITE_OTHER): Payer: 59

## 2011-10-02 ENCOUNTER — Encounter: Payer: Self-pay | Admitting: Internal Medicine

## 2011-10-02 ENCOUNTER — Ambulatory Visit (INDEPENDENT_AMBULATORY_CARE_PROVIDER_SITE_OTHER): Payer: 59 | Admitting: Internal Medicine

## 2011-10-02 VITALS — BP 118/80 | HR 84 | Temp 98.1°F | Resp 16 | Wt 248.0 lb

## 2011-10-02 DIAGNOSIS — E119 Type 2 diabetes mellitus without complications: Secondary | ICD-10-CM

## 2011-10-02 DIAGNOSIS — IMO0001 Reserved for inherently not codable concepts without codable children: Secondary | ICD-10-CM

## 2011-10-02 DIAGNOSIS — K3184 Gastroparesis: Secondary | ICD-10-CM

## 2011-10-02 DIAGNOSIS — F329 Major depressive disorder, single episode, unspecified: Secondary | ICD-10-CM

## 2011-10-02 LAB — BASIC METABOLIC PANEL
BUN: 10 mg/dL (ref 6–23)
Creatinine, Ser: 0.7 mg/dL (ref 0.4–1.2)
GFR: 90.8 mL/min (ref >60.00)
Potassium: 4 mEq/L (ref 3.5–5.1)

## 2011-10-02 LAB — HEMOGLOBIN A1C: Hgb A1c MFr Bld: 6.9 % — ABNORMAL HIGH (ref 4.6–6.5)

## 2011-10-02 MED ORDER — COLESEVELAM HCL 3.75 G PO PACK: 1.0000 | PACK | ORAL | Status: DC

## 2011-10-02 MED ORDER — HYDROXYZINE PAMOATE 50 MG PO CAPS: 50.0000 mg | ORAL_CAPSULE | Freq: Once | ORAL | Status: DC

## 2011-10-02 MED ORDER — ESOMEPRAZOLE MAGNESIUM 40 MG PO CPDR: 40.0000 mg | DELAYED_RELEASE_CAPSULE | Freq: Every day | ORAL | Status: DC

## 2011-10-02 MED ORDER — DICYCLOMINE HCL 20 MG PO TABS: 20.0000 mg | ORAL_TABLET | Freq: Three times a day (TID) | ORAL | Status: DC | PRN

## 2011-10-02 MED ORDER — CLONAZEPAM 1 MG PO TABS: 1.0000 mg | ORAL_TABLET | Freq: Three times a day (TID) | ORAL | Status: DC | PRN

## 2011-10-02 MED ORDER — METFORMIN HCL 500 MG PO TABS: 500.0000 mg | ORAL_TABLET | Freq: Two times a day (BID) | ORAL | Status: DC

## 2011-10-02 MED ORDER — TIZANIDINE HCL 4 MG PO TABS: 4.0000 mg | ORAL_TABLET | Freq: Three times a day (TID) | ORAL | Status: DC | PRN

## 2011-10-02 MED ORDER — ESOMEPRAZOLE MAGNESIUM 20 MG PO CPDR: 20.0000 mg | DELAYED_RELEASE_CAPSULE | Freq: Every day | ORAL | Status: DC

## 2011-10-02 MED ORDER — HYDROCODONE-ACETAMINOPHEN 10-660 MG PO TABS: 1.0000 | ORAL_TABLET | Freq: Four times a day (QID) | ORAL | Status: DC | PRN

## 2011-10-02 MED ORDER — ESTROGENS, CONJUGATED 0.625 MG/GM VA CREA: TOPICAL_CREAM | Freq: Every day | VAGINAL | Status: DC

## 2011-10-02 MED ORDER — ALBUTEROL SULFATE HFA 108 (90 BASE) MCG/ACT IN AERS: 2.0000 | INHALATION_SPRAY | RESPIRATORY_TRACT | Status: DC | PRN

## 2011-10-02 NOTE — Progress Notes (Signed)
Patient ID: [name], female   DOB: 06-17-1956, 56 y.o.   MRN: [id number]  Subjective:    Patient ID: [name], female    DOB: 06-Jul-1956, 56 y.o.   MRN: [id number]  HPI   The patient is here to follow up on chronic depression, anxiety, headaches and chronic moderate fibromyalgia/LBP symptoms controlled with medicines. F/u chronic GI complaints. She had GI problems w/Pristique - stopped.  Wt Readings from Last 3 Encounters:  10/02/11 248 lb (112.492 kg)  07/28/11 250 lb (113.399 kg)  07/19/11 256 lb 6.4 oz (116.302 kg)     Review of Systems  Constitutional: Positive for fatigue. Negative for chills, activity change, appetite change and unexpected weight change.  HENT: Negative for congestion, mouth sores and sinus pressure.   Eyes: Negative for visual disturbance.  Respiratory: Negative for cough and chest tightness.   Gastrointestinal: Negative for nausea and abdominal pain.  Genitourinary: Negative for frequency, difficulty urinating and vaginal pain.  Musculoskeletal: Positive for myalgias, back pain and arthralgias. Negative for gait problem.  Skin: Negative for pallor and rash.  Neurological: Negative for dizziness, tremors, weakness, numbness and headaches.  Psychiatric/Behavioral: Positive for decreased concentration. Negative for suicidal ideas, confusion and sleep disturbance. The patient is nervous/anxious.        Objective:   Physical Exam  Constitutional: She appears well-developed. No distress.       Obese  HENT:  Head: Normocephalic.  Right Ear: External ear normal.  Left Ear: External ear normal.  Nose: Nose normal.  Mouth/Throat: Oropharynx is clear and moist.  Eyes: Conjunctivae are normal. Pupils are equal, round, and reactive to light. Right eye exhibits no discharge. Left eye exhibits no discharge.  Neck: Normal range of motion. Neck supple. No JVD present. No tracheal deviation present. No thyromegaly present.  Cardiovascular: Normal rate,  regular rhythm and normal heart sounds.   Pulmonary/Chest: No stridor. No respiratory distress. She has no wheezes.  Abdominal: Soft. Bowel sounds are normal. She exhibits no distension and no mass. There is no tenderness. There is no rebound and no guarding.  Musculoskeletal: She exhibits no edema and no tenderness.  Lymphadenopathy:    She has no cervical adenopathy.  Neurological: She displays normal reflexes. No cranial nerve deficit. She exhibits normal muscle tone. Coordination normal.  Skin: No rash noted. No erythema.  Psychiatric: Her behavior is normal. Judgment and thought content normal.       depressed          Assessment & Plan:

## 2011-10-02 NOTE — Assessment & Plan Note (Signed)
Continue with current prescription therapy as reflected on the Med list. Chronic. Situational. F/u w/Dr Dellia Cloud.

## 2011-10-02 NOTE — Assessment & Plan Note (Signed)
Continue with current prescription therapy as reflected on the Med list.  

## 2011-10-02 NOTE — Assessment & Plan Note (Signed)
She will see Dr Benita Gutter has been helping her lately.Marland KitchenMarland Kitchen

## 2011-10-02 NOTE — Progress Notes (Signed)
  Subjective:    Patient ID: [name], female    DOB: April 06, 1956, 56 y.o.   MRN: [id number]  HPI    Review of Systems     Objective:   Physical Exam        Assessment & Plan:

## 2011-10-02 NOTE — Assessment & Plan Note (Signed)
She lost wt

## 2011-10-03 ENCOUNTER — Other Ambulatory Visit: Payer: Self-pay | Admitting: Internal Medicine

## 2011-10-04 ENCOUNTER — Telehealth: Payer: Self-pay | Admitting: *Deleted

## 2011-10-04 NOTE — Telephone Encounter (Signed)
Pt left vm stating someone has been calling her and she is returning the call. I advised her it was not me and I'm not sure who is calling her but I do not see any documentation of this.

## 2011-10-09 ENCOUNTER — Ambulatory Visit: Payer: 59 | Admitting: Psychology

## 2011-10-09 ENCOUNTER — Ambulatory Visit (INDEPENDENT_AMBULATORY_CARE_PROVIDER_SITE_OTHER): Payer: 59 | Admitting: Psychology

## 2011-10-09 DIAGNOSIS — F331 Major depressive disorder, recurrent, moderate: Secondary | ICD-10-CM

## 2011-11-22 ENCOUNTER — Other Ambulatory Visit: Payer: Self-pay | Admitting: Gastroenterology

## 2011-11-22 DIAGNOSIS — R110 Nausea: Secondary | ICD-10-CM

## 2011-11-28 ENCOUNTER — Ambulatory Visit (INDEPENDENT_AMBULATORY_CARE_PROVIDER_SITE_OTHER): Payer: 59 | Admitting: Internal Medicine

## 2011-11-28 ENCOUNTER — Encounter: Payer: Self-pay | Admitting: Internal Medicine

## 2011-11-28 VITALS — BP 120/92 | HR 85 | Temp 98.3°F

## 2011-11-28 DIAGNOSIS — N390 Urinary tract infection, site not specified: Secondary | ICD-10-CM

## 2011-11-28 DIAGNOSIS — J45901 Unspecified asthma with (acute) exacerbation: Secondary | ICD-10-CM

## 2011-11-28 LAB — POCT URINALYSIS DIPSTICK
Ketones, UA: NEGATIVE
Protein, UA: NEGATIVE
Spec Grav, UA: 1.02
pH, UA: 5

## 2011-11-28 MED ORDER — METHYLPREDNISOLONE ACETATE 80 MG/ML IJ SUSP
120.0000 mg | Freq: Once | INTRAMUSCULAR | Status: AC
  Administered 2011-11-28: 120 mg via INTRAMUSCULAR

## 2011-11-28 MED ORDER — LEVOFLOXACIN 500 MG PO TABS: 500.0000 mg | ORAL_TABLET | Freq: Every day | ORAL | Status: AC

## 2011-11-28 NOTE — Progress Notes (Signed)
HPI: complains of UTI symptoms Onset 4 days ago, progressively worse associated with dysuria and small volume voiding with increased frequency denies hematuria, flank pain or fever The patient has a history of prior UTI  Also cough x 4 weeks -consistent with asthma flare and bronchitis per pt associated with wheeze and shortness of breath, exertional and supine   PMH: reviewed  ROS:  Gen.: No unexpected weight change, no night sweats Lungs: + cough and shortness of breath x several weeks - see above HPI Cardiovascular: No palpitations or chest pain  PE: BP 120/92  Pulse 85  Temp(Src) 98.3 F (36.8 C) (Oral)  SpO2 99% General: Obese, No acute distress Lungs: Good air movement. No increased work of breathing. Few rhonchi with end expiratory wheeze Cardiovascular: Regular rate rhythm, no edema Abdomen: Mild to moderate discomfort of her suprapubic region, no flank tenderness to palpation  Lab Results  Component Value Date   WBC 12.7* 07/17/2011   HGB 13.3 07/17/2011   HCT 39.3 07/17/2011   PLT 189 07/17/2011   GLUCOSE 103* 10/02/2011   CHOL 167 07/26/2010   TRIG 332.0* 07/26/2010   HDL 43.10 07/26/2010   LDLDIRECT 80.3 07/26/2010   ALT 13 07/17/2011   AST 15 07/17/2011   NA 141 10/02/2011   K 4.0 10/02/2011   CL 106 10/02/2011   CREATININE 0.7 10/02/2011   BUN 10 10/02/2011   CO2 27 10/02/2011   TSH 3.68 07/26/2010   HGBA1C 6.9* 10/02/2011   MICROALBUR 0.4 07/26/2010    Assessment/Plan: UTI, classic symptoms with history of same Asthmatic bronchitis diabetes mellitus type 2  Empiric antibiotic x7 days - reports tolerance of Levaquin despite allergy to Cipro and Avelox Urine culture for identification and sensitivities IM medrol for asthmatic symptoms today - patient advised on anticipated increase in cbgs due to steroid effect for next several days - pt will call if cbg>200 Hydration recommended, education provided

## 2011-11-28 NOTE — Patient Instructions (Signed)
It was good to see you today. Levaquin antibiotics - Your prescription(s) have been submitted to your pharmacy. Please take as directed and contact our office if you believe you are having problem(s) with the medication(s). Urine culture ordered today. Your results will be called to you after review (48-72hours after test completion). If any changes need to be made, you will be notified at that time. Steroid shot given today for inflammation and asthmatic bronchitis wheeze Watch your sugars and call if over 200 in next few days - steroids will increase your sugars more than usual for next few days!

## 2011-12-01 ENCOUNTER — Other Ambulatory Visit: Payer: 59

## 2011-12-07 ENCOUNTER — Other Ambulatory Visit: Payer: Self-pay | Admitting: Internal Medicine

## 2011-12-08 ENCOUNTER — Other Ambulatory Visit: Payer: 59

## 2012-01-03 ENCOUNTER — Telehealth: Payer: Self-pay | Admitting: *Deleted

## 2012-01-03 MED ORDER — PROMETHAZINE-CODEINE 6.25-10 MG/5ML PO SYRP: 5.0000 mL | ORAL_SOLUTION | Freq: Four times a day (QID) | ORAL | Status: AC | PRN

## 2012-01-03 NOTE — Telephone Encounter (Signed)
OK to fill this prescription with additional refills x0 Thank you!  

## 2012-01-03 NOTE — Telephone Encounter (Signed)
Rf req for Prometh/Codeine syr 1-2 po qid prn cough. Ok to Rf?

## 2012-01-03 NOTE — Telephone Encounter (Signed)
Done

## 2012-01-05 ENCOUNTER — Ambulatory Visit (INDEPENDENT_AMBULATORY_CARE_PROVIDER_SITE_OTHER): Payer: 59 | Admitting: Internal Medicine

## 2012-01-05 ENCOUNTER — Encounter: Payer: Self-pay | Admitting: Internal Medicine

## 2012-01-05 VITALS — BP 110/70 | HR 80 | Temp 98.2°F | Resp 16

## 2012-01-05 DIAGNOSIS — IMO0001 Reserved for inherently not codable concepts without codable children: Secondary | ICD-10-CM

## 2012-01-05 DIAGNOSIS — E119 Type 2 diabetes mellitus without complications: Secondary | ICD-10-CM

## 2012-01-05 DIAGNOSIS — G43909 Migraine, unspecified, not intractable, without status migrainosus: Secondary | ICD-10-CM

## 2012-01-05 DIAGNOSIS — R05 Cough: Secondary | ICD-10-CM

## 2012-01-05 MED ORDER — PREDNISONE 10 MG PO TABS: ORAL_TABLET | ORAL | Status: DC

## 2012-01-05 MED ORDER — LEVOFLOXACIN 500 MG PO TABS: 500.0000 mg | ORAL_TABLET | Freq: Every day | ORAL | Status: AC

## 2012-01-05 NOTE — Progress Notes (Signed)
Patient ID: [name], female   DOB: [birth year], 56 y.o.   MRN: [id number] Patient ID: [name], female   DOB: 05-05-1956, 56 y.o.   MRN: [id number]  Subjective:    Patient ID: [name], female    DOB: 1956/04/27, 56 y.o.   MRN: [id number]  Cough Associated symptoms include headaches, myalgias, shortness of breath and wheezing. Pertinent negatives include no chills or rash.  Wheezing  Associated symptoms include coughing, headaches and shortness of breath. Pertinent negatives include no abdominal pain, chills or rash.  Shortness of Breath Associated symptoms include headaches and wheezing. Pertinent negatives include no abdominal pain or rash.  Headache  Associated symptoms include back pain and coughing. Pertinent negatives include no abdominal pain, dizziness, nausea, numbness, sinus pressure or weakness.     The patient is here to follow up on chronic depression, anxiety, headaches and chronic moderate fibromyalgia/LBP symptoms controlled with medicines. F/u chronic GI complaints.  C/o URI, wheezing x 1 mo  Wt Readings from Last 3 Encounters:  10/02/11 248 lb (112.492 kg)  07/28/11 250 lb (113.399 kg)  07/19/11 256 lb 6.4 oz (116.302 kg)   BP Readings from Last 3 Encounters:  01/05/12 110/70  11/28/11 120/92  10/02/11 118/80     Review of Systems  Constitutional: Positive for fatigue. Negative for chills, activity change, appetite change and unexpected weight change.  HENT: Negative for congestion, mouth sores and sinus pressure.   Eyes: Negative for visual disturbance.  Respiratory: Positive for cough, shortness of breath and wheezing. Negative for chest tightness.   Gastrointestinal: Negative for nausea and abdominal pain.  Genitourinary: Negative for frequency, difficulty urinating and vaginal pain.  Musculoskeletal: Positive for myalgias, back pain and arthralgias. Negative for gait problem.  Skin: Negative for pallor and rash.  Neurological: Positive  for headaches. Negative for dizziness, tremors, weakness and numbness.  Psychiatric/Behavioral: Positive for decreased concentration. Negative for suicidal ideas, confusion and sleep disturbance. The patient is nervous/anxious.        Objective:   Physical Exam  Constitutional: She appears well-developed. No distress.       Obese  HENT:  Head: Normocephalic.  Right Ear: External ear normal.  Left Ear: External ear normal.  Nose: Nose normal.  Mouth/Throat: Oropharynx is clear and moist.  Eyes: Conjunctivae are normal. Pupils are equal, round, and reactive to light. Right eye exhibits no discharge. Left eye exhibits no discharge.  Neck: Normal range of motion. Neck supple. No JVD present. No tracheal deviation present. No thyromegaly present.  Cardiovascular: Normal rate, regular rhythm and normal heart sounds.   Pulmonary/Chest: No stridor. No respiratory distress. She has wheezes. She has rales.  Abdominal: Soft. Bowel sounds are normal. She exhibits no distension and no mass. There is no tenderness. There is no rebound and no guarding.  Musculoskeletal: She exhibits no edema and no tenderness.  Lymphadenopathy:    She has no cervical adenopathy.  Neurological: She displays normal reflexes. No cranial nerve deficit. She exhibits normal muscle tone. Coordination normal.  Skin: No rash noted. No erythema.  Psychiatric: Her behavior is normal. Judgment and thought content normal.       depressed   Lab Results  Component Value Date   WBC 12.7* 07/17/2011   HGB 13.3 07/17/2011   HCT 39.3 07/17/2011   PLT 189 07/17/2011   GLUCOSE 103* 10/02/2011   CHOL 167 07/26/2010   TRIG 332.0* 07/26/2010   HDL 43.10 07/26/2010   LDLDIRECT 80.3 07/26/2010   ALT 13  07/17/2011   AST 15 07/17/2011   NA 141 10/02/2011   K 4.0 10/02/2011   CL 106 10/02/2011   CREATININE 0.7 10/02/2011   BUN 10 10/02/2011   CO2 27 10/02/2011   TSH 3.68 07/26/2010   HGBA1C 6.9* 10/02/2011   MICROALBUR 0.4 07/26/2010            Assessment & Plan:

## 2012-01-07 ENCOUNTER — Encounter: Payer: Self-pay | Admitting: Internal Medicine

## 2012-01-07 NOTE — Assessment & Plan Note (Signed)
Continue with current prescription therapy as reflected on the Med list.  

## 2012-01-07 NOTE — Assessment & Plan Note (Signed)
Acute on chronic bronchitis 4/13 See meds

## 2012-01-22 ENCOUNTER — Ambulatory Visit
Admission: RE | Admit: 2012-01-22 | Discharge: 2012-01-22 | Disposition: A | Discharge status: Home / Self Care | Payer: 59 | Source: Ambulatory Visit | Attending: Gastroenterology | Admitting: Gastroenterology

## 2012-01-22 DIAGNOSIS — R110 Nausea: Secondary | ICD-10-CM

## 2012-01-26 ENCOUNTER — Other Ambulatory Visit: Payer: 59

## 2012-02-06 ENCOUNTER — Other Ambulatory Visit: Payer: Self-pay

## 2012-02-06 MED ORDER — ESOMEPRAZOLE MAGNESIUM 20 MG PO CPDR: 20.0000 mg | DELAYED_RELEASE_CAPSULE | Freq: Every day | ORAL | Status: DC

## 2012-02-09 ENCOUNTER — Telehealth: Payer: Self-pay | Admitting: *Deleted

## 2012-02-09 NOTE — Telephone Encounter (Signed)
PA for Nexium approved 02/09/12-02/09/2015. Pt and pharmacy informed.

## 2012-02-11 ENCOUNTER — Inpatient Hospital Stay (HOSPITAL_COMMUNITY)
Admission: EM | Admit: 2012-02-11 | Discharge: 2012-02-15 | DRG: 372 | Disposition: A | Discharge status: Home / Self Care | Payer: 59 | Source: Ambulatory Visit | Attending: Internal Medicine | Admitting: Internal Medicine

## 2012-02-11 ENCOUNTER — Encounter (HOSPITAL_COMMUNITY): Payer: Self-pay | Admitting: Emergency Medicine

## 2012-02-11 ENCOUNTER — Emergency Department (HOSPITAL_COMMUNITY): Payer: 59

## 2012-02-11 DIAGNOSIS — E669 Obesity, unspecified: Secondary | ICD-10-CM | POA: Diagnosis present

## 2012-02-11 DIAGNOSIS — K2970 Gastritis, unspecified, without bleeding: Secondary | ICD-10-CM | POA: Diagnosis present

## 2012-02-11 DIAGNOSIS — K209 Esophagitis, unspecified without bleeding: Secondary | ICD-10-CM | POA: Diagnosis present

## 2012-02-11 DIAGNOSIS — G609 Hereditary and idiopathic neuropathy, unspecified: Secondary | ICD-10-CM | POA: Diagnosis present

## 2012-02-11 DIAGNOSIS — E119 Type 2 diabetes mellitus without complications: Secondary | ICD-10-CM | POA: Diagnosis present

## 2012-02-11 DIAGNOSIS — K2990 Gastroduodenitis, unspecified, without bleeding: Secondary | ICD-10-CM | POA: Diagnosis present

## 2012-02-11 DIAGNOSIS — Z79899 Other long term (current) drug therapy: Secondary | ICD-10-CM

## 2012-02-11 DIAGNOSIS — K589 Irritable bowel syndrome without diarrhea: Secondary | ICD-10-CM | POA: Diagnosis present

## 2012-02-11 DIAGNOSIS — A0472 Enterocolitis due to Clostridium difficile, not specified as recurrent: Principal | ICD-10-CM | POA: Diagnosis present

## 2012-02-11 DIAGNOSIS — K2270 Barrett's esophagus without dysplasia: Secondary | ICD-10-CM | POA: Diagnosis present

## 2012-02-11 DIAGNOSIS — J4489 Other specified chronic obstructive pulmonary disease: Secondary | ICD-10-CM | POA: Diagnosis present

## 2012-02-11 DIAGNOSIS — K5900 Constipation, unspecified: Secondary | ICD-10-CM | POA: Diagnosis not present

## 2012-02-11 DIAGNOSIS — J441 Chronic obstructive pulmonary disease with (acute) exacerbation: Secondary | ICD-10-CM | POA: Diagnosis present

## 2012-02-11 DIAGNOSIS — R109 Unspecified abdominal pain: Secondary | ICD-10-CM

## 2012-02-11 DIAGNOSIS — IMO0001 Reserved for inherently not codable concepts without codable children: Secondary | ICD-10-CM | POA: Diagnosis present

## 2012-02-11 DIAGNOSIS — J449 Chronic obstructive pulmonary disease, unspecified: Secondary | ICD-10-CM | POA: Diagnosis present

## 2012-02-11 DIAGNOSIS — J45901 Unspecified asthma with (acute) exacerbation: Secondary | ICD-10-CM | POA: Diagnosis present

## 2012-02-11 DIAGNOSIS — G4733 Obstructive sleep apnea (adult) (pediatric): Secondary | ICD-10-CM | POA: Diagnosis present

## 2012-02-11 DIAGNOSIS — K219 Gastro-esophageal reflux disease without esophagitis: Secondary | ICD-10-CM | POA: Diagnosis present

## 2012-02-11 DIAGNOSIS — R197 Diarrhea, unspecified: Secondary | ICD-10-CM | POA: Diagnosis present

## 2012-02-11 LAB — DIFFERENTIAL
Basophils Relative: 0 % (ref 0–1)
Eosinophils Absolute: 0.3 10*3/uL (ref 0.0–0.7)
Eosinophils Relative: 2 % (ref 0–5)
Lymphs Abs: 4.2 10*3/uL — ABNORMAL HIGH (ref 0.7–4.0)
Monocytes Relative: 5 % (ref 3–12)
Neutrophils Relative %: 65 % (ref 43–77)

## 2012-02-11 LAB — COMPREHENSIVE METABOLIC PANEL
ALT: 23 U/L (ref 0–35)
AST: 22 U/L (ref 0–37)
Albumin: 3.9 g/dL (ref 3.5–5.2)
CO2: 22 mEq/L (ref 19–32)
Chloride: 102 mEq/L (ref 96–112)
Creatinine, Ser: 0.78 mg/dL (ref 0.50–1.10)
GFR calc non Af Amer: >90 mL/min (ref >90)
Sodium: 139 mEq/L (ref 135–145)
Total Bilirubin: 0.3 mg/dL (ref 0.3–1.2)

## 2012-02-11 LAB — CBC
Hemoglobin: 15 g/dL (ref 12.0–15.0)
MCH: 30 pg (ref 26.0–34.0)
MCHC: 34.8 g/dL (ref 30.0–36.0)
MCV: 86.2 fL (ref 78.0–100.0)
Platelets: 254 10*3/uL (ref 150–400)
RBC: 5 MIL/uL (ref 3.87–5.11)

## 2012-02-11 LAB — CARDIAC PANEL(CRET KIN+CKTOT+MB+TROPI)
CK, MB: 2.6 ng/mL (ref 0.3–4.0)
Relative Index: INVALID (ref 0.0–2.5)
Total CK: 68 U/L (ref 7–177)
Troponin I: <0.3 ng/mL (ref <0.30)

## 2012-02-11 LAB — LACTIC ACID, PLASMA: Lactic Acid, Venous: 3.6 mmol/L — ABNORMAL HIGH (ref 0.5–2.2)

## 2012-02-11 LAB — D-DIMER, QUANTITATIVE: D-Dimer, Quant: 0.42 ug/mL-FEU (ref 0.00–0.48)

## 2012-02-11 MED ORDER — ASPIRIN 81 MG PO CHEW
324.0000 mg | CHEWABLE_TABLET | Freq: Once | ORAL | Status: AC
  Administered 2012-02-11: 324 mg via ORAL
  Filled 2012-02-11: qty 4

## 2012-02-11 MED ORDER — METOCLOPRAMIDE HCL 5 MG/ML IJ SOLN: 5.0000 mg | Freq: Once | INTRAMUSCULAR | Status: DC

## 2012-02-11 MED ORDER — MORPHINE SULFATE 4 MG/ML IJ SOLN
6.0000 mg | Freq: Once | INTRAMUSCULAR | Status: AC
  Administered 2012-02-11: 6 mg via INTRAVENOUS
  Filled 2012-02-11: qty 2

## 2012-02-11 MED ORDER — METOCLOPRAMIDE HCL 5 MG/ML IJ SOLN
5.0000 mg | Freq: Once | INTRAMUSCULAR | Status: AC
  Administered 2012-02-11: 5 mg via INTRAVENOUS
  Filled 2012-02-11: qty 2

## 2012-02-11 MED ORDER — MORPHINE SULFATE 4 MG/ML IJ SOLN: 6.0000 mg | Freq: Once | INTRAMUSCULAR | Status: DC

## 2012-02-11 MED ORDER — SODIUM CHLORIDE 0.9 % IV SOLN
INTRAVENOUS | Status: DC
  Administered 2012-02-11: 19:00:00 via INTRAVENOUS
  Administered 2012-02-11: 125 mL/h via INTRAVENOUS

## 2012-02-11 MED ORDER — METOCLOPRAMIDE HCL 5 MG/ML IJ SOLN
10.0000 mg | Freq: Once | INTRAMUSCULAR | Status: AC
  Administered 2012-02-11: 10 mg via INTRAVENOUS
  Filled 2012-02-11: qty 2

## 2012-02-11 NOTE — ED Notes (Signed)
Patient also used a bedside.

## 2012-02-11 NOTE — ED Provider Notes (Cosign Needed)
History     CSN: [id number]  Arrival date & time 02/11/12  1753   First MD Initiated Contact with Patient 02/11/12 1757      Chief Complaint  Patient presents with  . Abdominal Pain  . Shortness of Breath    (Consider location/radiation/quality/duration/timing/severity/associated sxs/prior treatment) HPI Complains of shortness of breath for the past 4 days and developed diffuse abdominal pain and bloating of abdomen this morning patient has had multiple episodes of abdominal discomfort similar to this in the past, etiology unclear the last similar episode was November 2012 which resolved spontaneously after approximately 12 hours without treatment. Pain is severe, diffuse, similar to pain that she's had in the past etiology unclear. Nothing makes symptoms better or worse last bowel movement today normal no fever no other associated symptoms No treatment prior to coming here for abdominal pain. No other associated symptoms. She has been treating her dyspnea with albuterol, without relief   Past Medical History  Diagnosis Date  . Asthma   . COPD (chronic obstructive pulmonary disease)   . Depression   . GERD (gastroesophageal reflux disease)     Dr Juanda Chance  . Gastroparesis   . Migraines   . Menopause   . IBS (irritable bowel syndrome)   . Diabetes mellitus type II   . Peripheral neuropathy   . Fibromyalgia   . Obesity   . Barrett's esophagus   . MRSA (methicillin resistant Staphylococcus aureus)     possible (by verbal report from pt)  . OSA (obstructive sleep apnea)   . History of hiatal hernia     Past Surgical History  Procedure Date  . Abdominal hysterectomy     Complete  . Sinus surgery with instatrak     x3  . Surgery left heel spur   . Appendectomy   . Ventral hernia repair     with mesh  . Cholecystectomy   . Nissen fundoplication 1999  . Mouth surgery     Family History  Problem Relation Age of Onset  . Diabetes Mother   . Lung cancer Mother   . Cancer  Mother     lung  . Esophageal cancer Father   . Heart disease Father   . Cancer Father     esophageal  . Diabetes Father   . Colon polyps Father     Family History  . Breast cancer Maternal Grandmother   . Heart disease Maternal Grandmother   . Diabetes Maternal Grandmother   . Cancer Maternal Grandmother     breast, colon  . Heart attack Brother 55  . Colon cancer Maternal Grandfather   . Colon polyps Maternal Grandfather   . Diabetes Maternal Grandfather   . Colon polyps Paternal Grandfather   . Colon cancer Paternal Grandfather   . Breast cancer Maternal Aunt   . Arthritis    . Hypertension    . Uterine cancer Other     great grandmother    History  Substance Use Topics  . Smoking status: Former Smoker    Quit date: 12/24/2009  . Smokeless tobacco: Not on file   Comment: Stopped April 2011  . Alcohol Use: No     occasional (1-2 times per month)    OB History    Grav Para Term Preterm Abortions TAB SAB Ect Mult Living                  Review of Systems  Constitutional: Negative.   HENT: Negative.   Respiratory:  Positive for shortness of breath.   Cardiovascular: Negative.   Gastrointestinal: Positive for abdominal pain.  Musculoskeletal: Negative.   Skin: Negative.   Neurological: Negative.   Hematological: Negative.   Psychiatric/Behavioral: Negative.   All other systems reviewed and are negative.    Allergies  Droperidol; Amoxicillin-pot clavulanate; Ciprofloxacin; Duloxetine; Hydromorphone hcl; Iohexol; Ketorolac tromethamine; Moxifloxacin; Penicillins; Pioglitazone; Pregabalin; Pristiq; Quetiapine; and Topiramate  Home Medications   Current Outpatient Rx  Name Route Sig Dispense Refill  . ALBUTEROL SULFATE HFA 108 (90 BASE) MCG/ACT IN AERS Inhalation Inhale 2 puffs into the lungs every 4 (four) hours as needed. 3.7 g 3  . ONABOTULINUMTOXINA 200 UNITS IJ SOLR  Every 3 months for headaches, given by Neurologist, Dr. Neale Burly     . CETIRIZINE HCL  10 MG PO TABS Oral Take 10 mg by mouth. As needed    . CLONAZEPAM 1 MG PO TABS Oral Take 1 tablet (1 mg total) by mouth 3 (three) times daily as needed. 90 tablet 3  . COLESEVELAM HCL 3.75 G PO PACK Oral Take 1 each by mouth 1 day or 1 dose. 30 each 11  . ESTROGENS, CONJUGATED 0.625 MG/GM VA CREA Vaginal Place vaginally daily. Use pv 2-3/wk 42.5 g 12  . CYCLOSPORINE 0.05 % OP EMUL Both Eyes Place 1 drop into both eyes daily.      Marland Kitchen DICYCLOMINE HCL 20 MG PO TABS Oral Take 1 tablet (20 mg total) by mouth 3 (three) times daily as needed. 90 tablet 3  . ESCITALOPRAM OXALATE 20 MG PO TABS Oral Take 0.5-1 tablets (10-20 mg total) by mouth daily. 30 tablet 5  . ESOMEPRAZOLE MAGNESIUM 20 MG PO CPDR Oral Take 1 capsule (20 mg total) by mouth daily before breakfast. 90 capsule 3  . FUROSEMIDE 20 MG PO TABS Oral Take 20 mg by mouth daily. As needed     . GLUCOSE BLOOD VI STRP  Use as instructed 100 each 3  . HYDROCODONE-ACETAMINOPHEN 10-660 MG PO TABS Oral Take 1 tablet by mouth 4 (four) times daily as needed (for pain). 120 each 2  . HYDROXYZINE PAMOATE 50 MG PO CAPS Oral Take 1 capsule (50 mg total) by mouth once. 2 tab by mouth as needed for insomnia 90 capsule 3  . HYOSCYAMINE SULFATE 0.125 MG SL SUBL Sublingual Place 0.125 mg under the tongue every 4 (four) hours as needed.      . IPRATROPIUM-ALBUTEROL 0.5-2.5 (3) MG/3ML IN SOLN  USE 1 VIAL IN NEBULIZER 4 TIMES DAILY AS NEEDED 180 mL 3  . METFORMIN HCL 500 MG PO TABS Oral Take 1 tablet (500 mg total) by mouth 2 (two) times daily with a meal. 180 tablet 3  . METOCLOPRAMIDE HCL 10 MG PO TABS Oral Take 0.5-1 tablets (5-10 mg total) by mouth 4 (four) times daily. Use qid ac and at hs 120 tablet 3  . METRONIDAZOLE 0.75 % VA GEL Vaginal Place 1 Applicatorful vaginally 2 (two) times daily. 70 g 1  . OXYCODONE-ACETAMINOPHEN 5-325 MG PO TABS Oral Take 1 tablet by mouth every 6 (six) hours as needed for pain. Take 1-2 tablets by mouth every 6 hours as needed for  pain 15 tablet 0    Take 1 tab every 4-6 hours as needed for severe pa ...  . PATADAY 0.2 % OP SOLN  Use everyday    . PREDNISONE 10 MG PO TABS  Prednisone 10 mg: take 4 tabs a day x 3 days; then 3 tabs a day x  4 days; then 2 tabs a day x 4 days, then 1 tab a day x 6 days, then stop. Take pc. 38 tablet 1  . PREMARIN 0.9 MG PO TABS  TAKE 1 TABLET BY MOUTH EVERY DAY 30 tablet 5  . ALIGN 4 MG PO CAPS Oral Take 4 mg by mouth as needed. Daily    . PROMETHAZINE HCL 25 MG PO TABS  Take 1-2 tablets every 6 hours prn 90 tablet 0  . SUCRALFATE 1 G PO TABS Oral Take 1 g by mouth 2 (two) times daily.      Marland Kitchen TIZANIDINE HCL 4 MG PO TABS Oral Take 1 tablet (4 mg total) by mouth every 8 (eight) hours as needed. 90 tablet 1  . TRIAMCINOLONE ACETONIDE 0.1 % EX CREA Topical Apply topically 3 (three) times daily. As needed for rash 45 g 1    BP 173/97  Pulse 135  Temp(Src) 98.1 F (36.7 C) (Oral)  Resp 24  SpO2 96%  Physical Exam  Nursing note and vitals reviewed. Constitutional: She appears well-developed and well-nourished. She appears distressed.       Anxious appearing  HENT:  Head: Normocephalic and atraumatic.  Eyes: Conjunctivae are normal. Pupils are equal, round, and reactive to light.  Neck: Neck supple. No tracheal deviation present. No thyromegaly present.  Cardiovascular: Regular rhythm.   No murmur heard.      Tachycardic  Pulmonary/Chest: Effort normal and breath sounds normal.  Abdominal: Soft. Bowel sounds are normal. She exhibits no distension and no mass. There is tenderness. There is no rebound and no guarding.       Morbidly obese tender left upper quadrant  Musculoskeletal: Normal range of motion. She exhibits no edema and no tenderness.  Neurological: She is alert. Coordination normal.  Skin: Skin is warm and dry. No rash noted.  Psychiatric: She has a normal mood and affect.    ED Course  Procedures (including critical care time) 6:50 PM feels improved after treatment  with intravenous morphine and Reglan Labs Reviewed - No data to display No results found.  Date: 02/11/2012  Rate: 95  Rhythm: normal sinus rhythm  QRS Axis: normal  Intervals: normal  ST/T Wave abnormalities: nonspecific T wave changes  Conduction Disutrbances:none  Narrative Interpretation:   Old EKG Reviewed: unchanged No significant change from 07/17/2011 Results for orders placed during the hospital encounter of 02/11/12  COMPREHENSIVE METABOLIC PANEL      Component Value Range   Sodium 139  135 - 145 (mEq/L)   Potassium 3.7  3.5 - 5.1 (mEq/L)   Chloride 102  96 - 112 (mEq/L)   CO2 22  19 - 32 (mEq/L)   Glucose, Bld 134 (*) 70 - 99 (mg/dL)   BUN 12  6 - 23 (mg/dL)   Creatinine, Ser 4.09  0.50 - 1.10 (mg/dL)   Calcium 9.6  8.4 - 81.1 (mg/dL)   Total Protein 7.4  6.0 - 8.3 (g/dL)   Albumin 3.9  3.5 - 5.2 (g/dL)   AST 22  0 - 37 (U/L)   ALT 23  0 - 35 (U/L)   Alkaline Phosphatase 63  39 - 117 (U/L)   Total Bilirubin 0.3  0.3 - 1.2 (mg/dL)   GFR calc non Af Amer >90  >90 (mL/min)   GFR calc Af Amer >90  >90 (mL/min)  CBC      Component Value Range   WBC 14.8 (*) 4.0 - 10.5 (K/uL)   RBC 5.00  3.87 -  5.11 (MIL/uL)   Hemoglobin 15.0  12.0 - 15.0 (g/dL)   HCT 40.9  81.1 - 91.4 (%)   MCV 86.2  78.0 - 100.0 (fL)   MCH 30.0  26.0 - 34.0 (pg)   MCHC 34.8  30.0 - 36.0 (g/dL)   RDW 78.2  95.6 - 21.3 (%)   Platelets 254  150 - 400 (K/uL)  DIFFERENTIAL      Component Value Range   Neutrophils Relative 65  43 - 77 (%)   Neutro Abs 9.6 (*) 1.7 - 7.7 (K/uL)   Lymphocytes Relative 28  12 - 46 (%)   Lymphs Abs 4.2 (*) 0.7 - 4.0 (K/uL)   Monocytes Relative 5  3 - 12 (%)   Monocytes Absolute 0.7  0.1 - 1.0 (K/uL)   Eosinophils Relative 2  0 - 5 (%)   Eosinophils Absolute 0.3  0.0 - 0.7 (K/uL)   Basophils Relative 0  0 - 1 (%)   Basophils Absolute 0.1  0.0 - 0.1 (K/uL)  LIPASE, BLOOD      Component Value Range   Lipase 30  11 - 59 (U/L)  POCT I-STAT TROPONIN I      Component  Value Range   Troponin i, poc 0.31 (*) 0.00 - 0.08 (ng/mL)   Comment NOTIFIED PHYSICIAN     Comment 3           LACTIC ACID, PLASMA      Component Value Range   Lactic Acid, Venous 3.6 (*) 0.5 - 2.2 (mmol/L)  CARDIAC PANEL(CRET KIN+CKTOT+MB+TROPI)      Component Value Range   Total CK 68  7 - 177 (U/L)   CK, MB 2.6  0.3 - 4.0 (ng/mL)   Troponin I <0.30  <0.30 (ng/mL)   Relative Index RELATIVE INDEX IS INVALID  0.0 - 2.5   D-DIMER, QUANTITATIVE      Component Value Range   D-Dimer, Quant 0.42  0.00 - 0.48 (ug/mL-FEU)   Ct Abdomen Pelvis Wo Contrast  02/11/2012  *RADIOLOGY REPORT*  Clinical Data: Upper abdominal pain with nausea and vomiting.  CT ABDOMEN AND PELVIS WITHOUT CONTRAST  Technique:  Multidetector CT imaging of the abdomen and pelvis was performed following the standard protocol without intravenous contrast.  Comparison: CT scan dated 04/26/2011  Findings: There is gaseous distention of the stomach.  The duodenum is not distended. Large and small bowel appear normal.  Liver, spleen, pancreas, adrenal glands, and kidneys are normal. Gallbladder has been removed.  Uterus and ovaries have been removed.  Appendix is been removed.  No acute osseous abnormality. 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  IMPRESSION: Gaseous distention of the stomach without evidence of duodenal obstruction.  Otherwise benign-appearing abdomen and pelvis.  Original Report Authenticated By: Gwynn Burly, M.D.   Varney Biles Kayleen Memos Magnus Ivan Density Hans Eden  01/22/2012  *RADIOLOGY REPORT*  Clinical Data:  Emesis, bloating, abdominal pain.  UPPER GI SERIES WITH KUB  Technique:  Routine upper GI series was performed with thin and high density barium.  Fluoroscopy Time: 2.0 minutes  Comparison:  Abdominal radiographs 07/17/2011 and CT abdomen pelvis 04/26/2011.  Findings: Scout view of the abdomen shows a fair amount of stool in the colon.  Surgical clips are seen in the right upper quadrant.  Double contrast  examination of the upper gastrointestinal tract shows normal esophageal motility.  No esophageal fold thickening, stricture or obstruction.  Tiny hiatal hernia.  Stomach and duodenal bulb are normal.  IMPRESSION:  1.  Unremarkable  double contrast examination of the upper gastrointestinal tract. 2.  Constipation.  Original Report Authenticated By: Reyes Ivan, M.D.     No diagnosis found.  11:30 PM pain improved since she has been passing gas per rectum and having diarrhea breathing feels improved after she has passed gas and had diarrhea and abdomen feels less distended. MDM  Initial plan of care troponin likely factitious Symptoms not not suggestive of acute coronary syndrome Spoke with Dr. Jarrett Soho Plan 23 hour observation to med surge floor Diagnosis #1 abdominal pain #2 Dyspnea        Doug Sou, MD 02/11/12 2348

## 2012-02-11 NOTE — ED Notes (Addendum)
C/o LUQ pain x 2 hours with nausea.  Reports wheezing over the last 2 days- using home nebs.  States nebs not helping today.  Denies urinary complaints. Pt belching a lot during triage exam.

## 2012-02-11 NOTE — ED Notes (Signed)
Spoke to [facility]ad about pt. Going to talk to [facility] for orders.

## 2012-02-12 ENCOUNTER — Encounter (HOSPITAL_COMMUNITY): Payer: Self-pay | Admitting: *Deleted

## 2012-02-12 ENCOUNTER — Inpatient Hospital Stay (HOSPITAL_COMMUNITY): Payer: 59

## 2012-02-12 DIAGNOSIS — E1165 Type 2 diabetes mellitus with hyperglycemia: Secondary | ICD-10-CM

## 2012-02-12 DIAGNOSIS — K589 Irritable bowel syndrome without diarrhea: Secondary | ICD-10-CM

## 2012-02-12 DIAGNOSIS — J45901 Unspecified asthma with (acute) exacerbation: Secondary | ICD-10-CM

## 2012-02-12 DIAGNOSIS — R109 Unspecified abdominal pain: Secondary | ICD-10-CM

## 2012-02-12 DIAGNOSIS — R1013 Epigastric pain: Secondary | ICD-10-CM

## 2012-02-12 LAB — DIFFERENTIAL
Eosinophils Relative: 0 % (ref 0–5)
Lymphocytes Relative: 15 % (ref 12–46)
Lymphs Abs: 1.4 10*3/uL (ref 0.7–4.0)
Monocytes Absolute: 0.1 10*3/uL (ref 0.1–1.0)
Monocytes Relative: 1 % — ABNORMAL LOW (ref 3–12)

## 2012-02-12 LAB — CBC
HCT: 38 % (ref 36.0–46.0)
MCV: 86 fL (ref 78.0–100.0)
RDW: 12.6 % (ref 11.5–15.5)
WBC: 8.8 10*3/uL (ref 4.0–10.5)

## 2012-02-12 LAB — GLUCOSE, CAPILLARY
Glucose-Capillary: 190 mg/dL — ABNORMAL HIGH (ref 70–99)
Glucose-Capillary: 212 mg/dL — ABNORMAL HIGH (ref 70–99)

## 2012-02-12 LAB — CARDIAC PANEL(CRET KIN+CKTOT+MB+TROPI)
CK, MB: 3.1 ng/mL (ref 0.3–4.0)
Total CK: 65 U/L (ref 7–177)

## 2012-02-12 LAB — COMPREHENSIVE METABOLIC PANEL
AST: 42 U/L — ABNORMAL HIGH (ref 0–37)
Albumin: 3.3 g/dL — ABNORMAL LOW (ref 3.5–5.2)
CO2: 21 mEq/L (ref 19–32)
Calcium: 8.2 mg/dL — ABNORMAL LOW (ref 8.4–10.5)
Creatinine, Ser: 0.56 mg/dL (ref 0.50–1.10)
GFR calc non Af Amer: >90 mL/min (ref >90)

## 2012-02-12 MED ORDER — HYDROXYZINE HCL 50 MG PO TABS
50.0000 mg | ORAL_TABLET | Freq: Every evening | ORAL | Status: DC | PRN
  Administered 2012-02-13: 50 mg via ORAL
  Filled 2012-02-12 (×2): qty 1

## 2012-02-12 MED ORDER — CYCLOSPORINE 0.05 % OP EMUL
1.0000 [drp] | Freq: Every day | OPHTHALMIC | Status: DC
  Administered 2012-02-12 – 2012-02-14 (×3): 1 [drp] via OPHTHALMIC
  Filled 2012-02-12 (×4): qty 1

## 2012-02-12 MED ORDER — LORATADINE 10 MG PO TABS
10.0000 mg | ORAL_TABLET | Freq: Every day | ORAL | Status: DC
  Administered 2012-02-12 – 2012-02-14 (×3): 10 mg via ORAL
  Filled 2012-02-12 (×4): qty 1

## 2012-02-12 MED ORDER — METOCLOPRAMIDE HCL 5 MG PO TABS
5.0000 mg | ORAL_TABLET | Freq: Three times a day (TID) | ORAL | Status: DC
  Administered 2012-02-12 – 2012-02-13 (×8): 10 mg via ORAL
  Filled 2012-02-12 (×14): qty 2

## 2012-02-12 MED ORDER — HYDROXYZINE HCL 25 MG PO TABS
50.0000 mg | ORAL_TABLET | Freq: Once | ORAL | Status: DC
  Filled 2012-02-12: qty 2

## 2012-02-12 MED ORDER — ALBUTEROL SULFATE (5 MG/ML) 0.5% IN NEBU
2.5000 mg | INHALATION_SOLUTION | Freq: Four times a day (QID) | RESPIRATORY_TRACT | Status: DC
  Administered 2012-02-12 – 2012-02-14 (×11): 2.5 mg via RESPIRATORY_TRACT
  Filled 2012-02-12 (×12): qty 0.5

## 2012-02-12 MED ORDER — PANTOPRAZOLE SODIUM 40 MG PO TBEC
40.0000 mg | DELAYED_RELEASE_TABLET | Freq: Every day | ORAL | Status: DC
  Administered 2012-02-12 – 2012-02-14 (×3): 40 mg via ORAL
  Filled 2012-02-12 (×2): qty 1

## 2012-02-12 MED ORDER — DICYCLOMINE HCL 20 MG PO TABS
20.0000 mg | ORAL_TABLET | Freq: Three times a day (TID) | ORAL | Status: DC | PRN
  Filled 2012-02-12 (×4): qty 1

## 2012-02-12 MED ORDER — HYDROCODONE-ACETAMINOPHEN 10-660 MG PO TABS: 1.0000 | ORAL_TABLET | Freq: Four times a day (QID) | ORAL | Status: DC | PRN

## 2012-02-12 MED ORDER — TRIAMCINOLONE ACETONIDE 0.1 % EX CREA
TOPICAL_CREAM | Freq: Three times a day (TID) | CUTANEOUS | Status: DC
  Administered 2012-02-12 – 2012-02-13 (×2): via TOPICAL
  Filled 2012-02-12: qty 15

## 2012-02-12 MED ORDER — HYDROCODONE-ACETAMINOPHEN 5-325 MG PO TABS
2.0000 | ORAL_TABLET | Freq: Four times a day (QID) | ORAL | Status: DC | PRN
  Administered 2012-02-12 – 2012-02-13 (×5): 2 via ORAL
  Administered 2012-02-14: 1 via ORAL
  Administered 2012-02-14 (×2): 2 via ORAL
  Filled 2012-02-12 (×2): qty 2
  Filled 2012-02-12: qty 1
  Filled 2012-02-12 (×5): qty 2

## 2012-02-12 MED ORDER — SUCRALFATE 1 G PO TABS
1.0000 g | ORAL_TABLET | Freq: Two times a day (BID) | ORAL | Status: DC
  Administered 2012-02-12 – 2012-02-14 (×4): 1 g via ORAL
  Filled 2012-02-12 (×9): qty 1

## 2012-02-12 MED ORDER — INSULIN ASPART 100 UNIT/ML ~~LOC~~ SOLN
0.0000 [IU] | Freq: Three times a day (TID) | SUBCUTANEOUS | Status: DC
  Administered 2012-02-12 (×2): 3 [IU] via SUBCUTANEOUS
  Administered 2012-02-12: 2 [IU] via SUBCUTANEOUS

## 2012-02-12 MED ORDER — ALBUTEROL SULFATE (5 MG/ML) 0.5% IN NEBU: 2.5000 mg | INHALATION_SOLUTION | RESPIRATORY_TRACT | Status: DC | PRN

## 2012-02-12 MED ORDER — CLONAZEPAM 1 MG PO TABS
1.0000 mg | ORAL_TABLET | Freq: Three times a day (TID) | ORAL | Status: DC | PRN
  Administered 2012-02-12 – 2012-02-14 (×6): 1 mg via ORAL
  Filled 2012-02-12 (×6): qty 1

## 2012-02-12 MED ORDER — MORPHINE SULFATE 2 MG/ML IJ SOLN
INTRAMUSCULAR | Status: AC
  Filled 2012-02-12: qty 1

## 2012-02-12 MED ORDER — COLESEVELAM HCL 3.75 G PO PACK: 1.0000 | PACK | ORAL | Status: DC

## 2012-02-12 MED ORDER — HYOSCYAMINE SULFATE 0.125 MG PO TABS
0.1250 mg | ORAL_TABLET | ORAL | Status: DC | PRN
  Filled 2012-02-12: qty 1

## 2012-02-12 MED ORDER — ACETAMINOPHEN 650 MG RE SUPP: 650.0000 mg | Freq: Four times a day (QID) | RECTAL | Status: DC | PRN

## 2012-02-12 MED ORDER — ONDANSETRON HCL 4 MG PO TABS: 4.0000 mg | ORAL_TABLET | Freq: Four times a day (QID) | ORAL | Status: DC | PRN

## 2012-02-12 MED ORDER — SODIUM CHLORIDE 0.9 % IV SOLN
INTRAVENOUS | Status: DC
  Administered 2012-02-12 (×2): via INTRAVENOUS
  Administered 2012-02-12: 125 mL/h via INTRAVENOUS

## 2012-02-12 MED ORDER — MORPHINE SULFATE 2 MG/ML IJ SOLN
1.0000 mg | Freq: Four times a day (QID) | INTRAMUSCULAR | Status: DC | PRN
  Administered 2012-02-12: 1 mg via INTRAVENOUS

## 2012-02-12 MED ORDER — TIZANIDINE HCL 4 MG PO TABS
4.0000 mg | ORAL_TABLET | Freq: Three times a day (TID) | ORAL | Status: DC | PRN
  Administered 2012-02-14 (×2): 4 mg via ORAL
  Filled 2012-02-12 (×4): qty 1

## 2012-02-12 MED ORDER — ACETAMINOPHEN 325 MG PO TABS: 650.0000 mg | ORAL_TABLET | Freq: Four times a day (QID) | ORAL | Status: DC | PRN

## 2012-02-12 MED ORDER — COLESEVELAM HCL 625 MG PO TABS
1875.0000 mg | ORAL_TABLET | Freq: Two times a day (BID) | ORAL | Status: DC
Start: 2012-02-12 — End: 2012-02-12
  Administered 2012-02-12 (×2): 1875 mg via ORAL
  Filled 2012-02-12 (×3): qty 3

## 2012-02-12 MED ORDER — ONDANSETRON HCL 4 MG/2ML IJ SOLN: 4.0000 mg | Freq: Four times a day (QID) | INTRAMUSCULAR | Status: DC | PRN

## 2012-02-12 MED ORDER — BUDESONIDE 0.5 MG/2ML IN SUSP
0.5000 mg | Freq: Two times a day (BID) | RESPIRATORY_TRACT | Status: DC
  Administered 2012-02-12 – 2012-02-15 (×7): 0.5 mg via RESPIRATORY_TRACT
  Filled 2012-02-12 (×10): qty 2

## 2012-02-12 MED ORDER — IPRATROPIUM BROMIDE 0.02 % IN SOLN
0.5000 mg | Freq: Four times a day (QID) | RESPIRATORY_TRACT | Status: DC
  Administered 2012-02-12 – 2012-02-14 (×10): 0.5 mg via RESPIRATORY_TRACT
  Filled 2012-02-12 (×11): qty 2.5

## 2012-02-12 MED ORDER — HYOSCYAMINE SULFATE 0.125 MG SL SUBL: 0.1250 mg | SUBLINGUAL_TABLET | SUBLINGUAL | Status: DC | PRN

## 2012-02-12 MED ORDER — METHYLPREDNISOLONE SODIUM SUCC 40 MG IJ SOLR
40.0000 mg | Freq: Once | INTRAMUSCULAR | Status: AC
  Administered 2012-02-12: 40 mg via INTRAVENOUS
  Filled 2012-02-12: qty 1

## 2012-02-12 NOTE — Consult Note (Signed)
[facility] Consultation Note  Referring Provider: Dr. Blake Divine [facility]) Primary Care Physician:  Sonda Primes, MD, MD  Reason for Consultation:  Abdominal pain  HPI: [name] is a 56 y.o. female with history of longstanding intermittent abdominal pain.  She has been previously seen by Dr. Juanda Chance, and I have seen her a few times over the past several months.  For the past ~ one year, she has had intermittent and discrete episodes of LUQ pain, nausea, bloating, diarrhea .  Endoscopy May 2012 by Dr. Juanda Chance showed bile gastritis, otherwise normal.  CT yesterday showed gastric distention without obvious obstruction or inflammatory process.   Past Medical History  Diagnosis Date  . Asthma   . COPD (chronic obstructive pulmonary disease)   . Depression   . GERD (gastroesophageal reflux disease)     Dr Juanda Chance  . Gastroparesis   . Migraines   . Menopause   . IBS (irritable bowel syndrome)   . Diabetes mellitus type II   . Peripheral neuropathy   . Fibromyalgia   . Obesity   . Barrett's esophagus   . MRSA (methicillin resistant Staphylococcus aureus)     possible (by verbal report from pt)  . OSA (obstructive sleep apnea)   . History of hiatal hernia     Past Surgical History  Procedure Date  . Abdominal hysterectomy     Complete  . Sinus surgery with instatrak     x3  . Surgery left heel spur   . Appendectomy   . Ventral hernia repair     with mesh  . Cholecystectomy   . Nissen fundoplication 1999  . Mouth surgery     Prior to Admission medications   Medication Sig Start Date End Date Taking? Authorizing Provider  Botulinum Toxin Type A (BOTOX) 200 UNITS SOLR Every 3 months for headaches, given by Neurologist, Dr. Neale Burly    Yes Historical Provider, MD  cetirizine (ZYRTEC HIVES RELIEF) 10 MG tablet Take 10 mg by mouth. As needed   Yes Historical Provider, MD  clonazePAM (KLONOPIN) 1 MG tablet Take 1 tablet (1 mg total) by mouth 3 (three) times daily as needed.  10/02/11  Yes Georgina Quint Plotnikov, MD  Colesevelam HCl [facility]) 3.75 G PACK Take 1 each by mouth 1 day or 1 dose. 10/02/11  Yes Georgina Quint Plotnikov, MD  conjugated estrogens (PREMARIN) vaginal cream Place vaginally daily. Use pv 2-3/wk 10/02/11 10/01/12 Yes Georgina Quint Plotnikov, MD  cycloSPORINE (RESTASIS) 0.05 % ophthalmic emulsion Place 1 drop into both eyes daily.     Yes Historical Provider, MD  dicyclomine (BENTYL) 20 MG tablet Take 1 tablet (20 mg total) by mouth 3 (three) times daily as needed. 10/02/11 10/01/12 Yes Georgina Quint Plotnikov, MD  esomeprazole (NEXIUM) 20 MG capsule Take 1 capsule (20 mg total) by mouth daily before breakfast. 02/06/12 02/05/13 Yes Georgina Quint Plotnikov, MD  furosemide (LASIX) 20 MG tablet Take 20 mg by mouth daily. As needed    Yes Historical Provider, MD  Hydrocodone-Acetaminophen 10-660 MG TABS Take 1 tablet by mouth 4 (four) times daily as needed (for pain). 10/02/11  Yes Georgina Quint Plotnikov, MD  hyoscyamine (LEVSIN SL) 0.125 MG SL tablet Place 0.125 mg under the tongue every 4 (four) hours as needed.     Yes Historical Provider, MD  ipratropium-albuterol (DUONEB) 0.5-2.5 (3) MG/3ML SOLN USE 1 VIAL IN NEBULIZER 4 TIMES DAILY AS NEEDED 12/07/11  Yes Georgina Quint Plotnikov, MD  metFORMIN (GLUCOPHAGE) 500 MG tablet Take 1  tablet (500 mg total) by mouth 2 (two) times daily with a meal. 10/02/11  Yes Tresa Garter, MD  metoCLOPramide (REGLAN) 10 MG tablet Take 0.5-1 tablets (5-10 mg total) by mouth 4 (four) times daily. Use qid ac and at hs 07/26/11  Yes Georgina Quint Plotnikov, MD  metroNIDAZOLE (METROGEL) 0.75 % vaginal gel Place 1 Applicatorful vaginally 2 (two) times daily. 07/03/11  Yes Georgina Quint Plotnikov, MD  PATADAY 0.2 % SOLN Use everyday 11/20/11  Yes Historical Provider, MD  PREMARIN 0.9 MG tablet TAKE 1 TABLET BY MOUTH EVERY DAY 10/03/11  Yes Georgina Quint Plotnikov, MD  sucralfate (CARAFATE) 1 G tablet Take 1 g by mouth 2 (two) times daily.     Yes Historical Provider, MD  tiZANidine  (ZANAFLEX) 4 MG tablet Take 1 tablet (4 mg total) by mouth every 8 (eight) hours as needed. 10/02/11  Yes Georgina Quint Plotnikov, MD  triamcinolone (KENALOG) 0.1 % cream Apply topically 3 (three) times daily. As needed for rash 07/03/11  Yes Georgina Quint Plotnikov, MD  albuterol (PROAIR HFA) 108 (90 BASE) MCG/ACT inhaler Inhale 2 puffs into the lungs every 4 (four) hours as needed. 10/02/11   Georgina Quint Plotnikov, MD  glucose blood test strip Use as instructed 07/03/11   Tresa Garter, MD  hydrOXYzine (VISTARIL) 50 MG capsule Take 1 capsule (50 mg total) by mouth once. 2 tab by mouth as needed for insomnia 10/02/11   Tresa Garter, MD  Probiotic Product (ALIGN) 4 MG CAPS Take 4 mg by mouth as needed. Daily    Historical Provider, MD  promethazine (PHENERGAN) 25 MG tablet Take 1-2 tablets every 6 hours prn 07/03/11   Tresa Garter, MD    Current Facility-Administered Medications  Medication Dose Route Frequency Provider Last Rate Last Dose  . 0.9 %  sodium chloride infusion   Intravenous Continuous Eduard Clos, MD 125 mL/hr at 02/12/12 1521 125 mL/hr at 02/12/12 1521  . acetaminophen (TYLENOL) tablet 650 mg  650 mg Oral Q6H PRN Eduard Clos, MD       Or  . acetaminophen (TYLENOL) suppository 650 mg  650 mg Rectal Q6H PRN Eduard Clos, MD      . albuterol (PROVENTIL) (5 MG/ML) 0.5% nebulizer solution 2.5 mg  2.5 mg Nebulization Q6H Eduard Clos, MD   2.5 mg at 02/12/12 0906  . albuterol (PROVENTIL) (5 MG/ML) 0.5% nebulizer solution 2.5 mg  2.5 mg Nebulization Q2H PRN Eduard Clos, MD      . aspirin chewable tablet 324 mg  324 mg Oral Once Doug Sou, MD   324 mg at 02/11/12 2004  . budesonide (PULMICORT) nebulizer solution 0.5 mg  0.5 mg Nebulization BID Eduard Clos, MD   0.5 mg at 02/12/12 1108  . clonazePAM (KLONOPIN) tablet 1 mg  1 mg Oral TID PRN Eduard Clos, MD   1 mg at 02/12/12 1038  . colesevelam [facility]) tablet 1,875 mg  1,875 mg  Oral BID WC Kathlen Mody, MD   1,875 mg at 02/12/12 0803  . cycloSPORINE (RESTASIS) 0.05 % ophthalmic emulsion 1 drop  1 drop Both Eyes Daily Eduard Clos, MD   1 drop at 02/12/12 1038  . dicyclomine (BENTYL) tablet 20 mg  20 mg Oral TID PRN Eduard Clos, MD      . HYDROcodone-acetaminophen (NORCO) 5-325 MG per tablet 2 tablet  2 tablet Oral QID PRN Kathlen Mody, MD   2 tablet at 02/12/12 1412  .  hydrOXYzine (ATARAX/VISTARIL) tablet 50 mg  50 mg Oral QHS PRN Kathlen Mody, MD      . hyoscyamine (LEVSIN, ANASPAZ) tablet 0.125 mg  0.125 mg Oral Q4H PRN Kathlen Mody, MD      . insulin aspart (novoLOG) injection 0-9 Units  0-9 Units Subcutaneous TID WC Eduard Clos, MD   3 Units at 02/12/12 1249  . ipratropium (ATROVENT) nebulizer solution 0.5 mg  0.5 mg Nebulization Q6H Eduard Clos, MD   0.5 mg at 02/12/12 0906  . loratadine (CLARITIN) tablet 10 mg  10 mg Oral Daily Eduard Clos, MD   10 mg at 02/12/12 1039  . methylPREDNISolone sodium succinate (SOLU-MEDROL) 40 mg/mL injection 40 mg  40 mg Intravenous Once Eduard Clos, MD   40 mg at 02/12/12 0225  . metoCLOPramide (REGLAN) injection 10 mg  10 mg Intravenous Once Doug Sou, MD   10 mg at 02/11/12 1827  . metoCLOPramide (REGLAN) injection 5 mg  5 mg Intravenous Once Doug Sou, MD   5 mg at 02/11/12 1928  . metoCLOPramide (REGLAN) injection 5 mg  5 mg Intravenous Once Doug Sou, MD   5 mg at 02/11/12 2011  . metoCLOPramide (REGLAN) tablet 5-10 mg  5-10 mg Oral TID AC & HS Eduard Clos, MD   10 mg at 02/12/12 1249  . morphine 2 MG/ML injection 1 mg  1 mg Intravenous Q6H PRN Eduard Clos, MD   1 mg at 02/12/12 0124  . morphine 2 MG/ML injection           . morphine 4 MG/ML injection 6 mg  6 mg Intravenous Once Doug Sou, MD   6 mg at 02/11/12 1828  . morphine 4 MG/ML injection 6 mg  6 mg Intravenous Once Doug Sou, MD   6 mg at 02/11/12 2000  . ondansetron (ZOFRAN) tablet 4  mg  4 mg Oral Q6H PRN Eduard Clos, MD       Or  . ondansetron [facility]) injection 4 mg  4 mg Intravenous Q6H PRN Eduard Clos, MD      . pantoprazole (PROTONIX) EC tablet 40 mg  40 mg Oral Daily Eduard Clos, MD   40 mg at 02/12/12 1039  . sucralfate (CARAFATE) tablet 1 g  1 g Oral BID WC Eduard Clos, MD   1 g at 02/12/12 1040  . tiZANidine (ZANAFLEX) tablet 4 mg  4 mg Oral Q8H PRN Eduard Clos, MD      . triamcinolone cream (KENALOG) 0.1 %   Topical TID Eduard Clos, MD      . DISCONTD: 0.9 %  sodium chloride infusion   Intravenous Continuous Doug Sou, MD 125 mL/hr at 02/11/12 2013 125 mL/hr at 02/11/12 2013  . DISCONTD: Colesevelam HCl PACK 1 each  1 each Oral 1 day or 1 dose Eduard Clos, MD      . DISCONTD: Hydrocodone-Acetaminophen 10-660 MG TABS 1 tablet  1 tablet Oral QID PRN Eduard Clos, MD      . DISCONTD: hydrOXYzine (ATARAX/VISTARIL) tablet 50 mg  50 mg Oral Once Eduard Clos, MD      . DISCONTD: hyoscyamine (LEVSIN SL) SL tablet 0.125 mg  0.125 mg Sublingual Q4H PRN Eduard Clos, MD      . DISCONTD: metoCLOPramide (REGLAN) injection 5 mg  5 mg Intravenous Once Doug Sou, MD      . DISCONTD: morphine 4 MG/ML injection 6 mg  6  mg Intravenous Once Doug Sou, MD        Allergies as of 02/11/2012 - Review Complete 02/11/2012  Allergen Reaction Noted  . Droperidol  10/02/2011  . Amoxicillin-pot clavulanate  11/22/2009  . Ciprofloxacin  02/07/2011  . Duloxetine  07/20/2010  . Hydromorphone hcl  04/16/2008  . Iohexol  11/23/2009  . Ketorolac tromethamine    . Moxifloxacin    . Penicillins  02/07/2011  . Pioglitazone  04/27/2009  . Pregabalin  09/22/2009  . Pristiq (desvenlafaxine succinate monohydrate)  07/03/2011  . Quetiapine  07/20/2010  . Topiramate      Family History  Problem Relation Age of Onset  . Diabetes Mother   . Lung cancer Mother   . Cancer Mother     lung  . Esophageal  cancer Father   . Heart disease Father   . Cancer Father     esophageal  . Diabetes Father   . Colon polyps Father     Family History  . Breast cancer Maternal Grandmother   . Heart disease Maternal Grandmother   . Diabetes Maternal Grandmother   . Cancer Maternal Grandmother     breast, colon  . Heart attack Brother 55  . Colon cancer Maternal Grandfather   . Colon polyps Maternal Grandfather   . Diabetes Maternal Grandfather   . Colon polyps Paternal Grandfather   . Colon cancer Paternal Grandfather   . Breast cancer Maternal Aunt   . Arthritis    . Hypertension    . Uterine cancer Other     great grandmother    History   Social History  . Marital Status: Married    Spouse Name: N/A    Number of Children: 3  . Years of Education: N/A   Occupational History  . CMA disabled    Social History Main Topics  . Smoking status: Former Smoker    Quit date: 12/24/2009  . Smokeless tobacco: Not on file   Comment: Stopped April 2011  . Alcohol Use: No     occasional (1-2 times per month)  . Drug Use: No  . Sexually Active: Yes   Other Topics Concern  . Not on file   Social History Narrative  . No narrative on file    Review of Systems:AS PER HPI  Physical Exam: Vital signs in last 24 hours: Temp:  [97.9 F (36.6 C)-98.3 F (36.8 C)] 97.9 F (36.6 C) (05/20 1400) Pulse Rate:  [85-135] 88  (05/20 1400) Resp:  [16-35] 22  (05/20 1400) BP: (117-173)/(71-100) 142/83 mmHg (05/20 1400) SpO2:  [93 %-100 %] 97 % (05/20 1400) Weight:  [113.399 kg (250 lb)] 113.399 kg (250 lb) (05/20 0022) Last BM Date: 02/12/12 General:   Alert,  Overweight, Well-developed, well-nourished, pleasant and cooperative in NAD Head:  Normocephalic and atraumatic. Eyes:  Sclera clear, no icterus.   Conjunctiva pink. Ears:  Normal auditory acuity. Nose:  No deformity, discharge,  or lesions. Mouth:  No deformity or lesions.  Oropharynx pink & moist. Neck:  Supple; no masses or  thyromegaly. Lungs:  Clear throughout to auscultation.   No wheezes, crackles, or rhonchi. No acute distress. Heart:  Regular rate and rhythm; no murmurs, clicks, rubs,  or gallops. Abdomen:  Soft, very mild distention, nontender, hypoactive but present bowel sounds. No masses, hepatosplenomegaly or hernias noted. Normal bowel sounds, without guarding, and without rebound.     Msk:  Symmetrical without gross deformities. Normal posture. Pulses:  Normal pulses noted. Extremities:  Without clubbing  or edema. Neurologic:  Alert and  oriented x4;  Diffusely weak but otherwise non-focal without lateralizing signs Skin:  Intact without significant lesions or rashes. Psych:  Alert and cooperative. Normal mood and affect.  Lab Results:  [facility] 02/12/12 0628 02/11/12 1818  WBC 8.8 14.8*  HGB 13.1 15.0  HCT 38.0 43.1  PLT 206 254   BMET  Basename 02/12/12 0628 02/11/12 1818  NA 131* 139  K 4.3 3.7  CL 101 102  CO2 21 22  GLUCOSE 182* 134*  BUN 12 12  CREATININE 0.56 0.78  CALCIUM 8.2* 9.6   LFT  Basename 02/12/12 0628  PROT 6.3  ALBUMIN 3.3*  AST 42*  ALT 45*  ALKPHOS 55  BILITOT 0.3  BILIDIR --  IBILI --   PT/INR No results found for this basename: LABPROT:2,INR:2 in the last 72 hours  Studies/Results: Ct Abdomen Pelvis Wo Contrast  02/11/2012  *RADIOLOGY REPORT*  Clinical Data: Upper abdominal pain with nausea and vomiting.  CT ABDOMEN AND PELVIS WITHOUT CONTRAST  Technique:  Multidetector CT imaging of the abdomen and pelvis was performed following the standard protocol without intravenous contrast.  Comparison: CT scan dated 04/26/2011  Findings: There is gaseous distention of the stomach.  The duodenum is not distended. Large and small bowel appear normal.  Liver, spleen, pancreas, adrenal glands, and kidneys are normal. Gallbladder has been removed.  Uterus and ovaries have been removed.  Appendix is been removed.  No acute osseous abnormality. I have evidence of  previous Nissen fundoplication.  IMPRESSION: Gaseous distention of the stomach without evidence of duodenal obstruction.  Otherwise benign-appearing abdomen and pelvis.  Original Report Authenticated By: Gwynn Burly, M.D.   Dg Chest Port 1 View  02/12/2012  *RADIOLOGY REPORT*  Clinical Data: Shortness of breath.  Abdominal pain.  PORTABLE CHEST - 1 VIEW  Comparison: 07/19/2011  Findings: Both lungs are clear.  Heart size is stable.  No evidence of pleural effusion.  IMPRESSION: No acute findings.  Original Report Authenticated By: Danae Orleans, M.D.   Impression:  1. Nausea, vomiting. 2.  Diarrhea. 3.  Abnormal CT abd/pelvis (gaseous distention of stomach).  Plan:  1.  Advance diet slowly. 2.  C. Diff and other stool studies in progress. 3.  C1 esterase inhibitor levels, urine porphyrin screen. 4.  EGD tomorrow. 5.  If endoscopy is negative, consider gastric emptying study. 6.  Will follow.  Thank you for the consult.   LOS: 1 day   Tatianna Ibbotson M  02/12/2012, 3:33 PM

## 2012-02-12 NOTE — Progress Notes (Signed)
Subjective: Frustrated that stool sample could not be sent. Abdominal pain better   Objective: Weight change:   Intake/Output Summary (Last 24 hours) at 02/12/12 2106 Last data filed at 02/12/12 2035  Gross per 24 hour  Intake 2895.83 ml  Output   1030 ml  Net 1865.83 ml    Filed Vitals:   02/12/12 1400  BP: 142/83  Pulse: 88  Temp: 97.9 F (36.6 C)  Resp: 22   On exam  She is alert afebrile comfortable CVS S1S2 HEARD Lungs clear Abdomen: soft mild gen tenderness. Bs+ Extemities: trace pedal edema.  Lab Results: Results for orders placed during the hospital encounter of 02/11/12 (from the past 24 hour(s))  LACTIC ACID, PLASMA     Status: Abnormal   Collection Time   02/11/12  9:13 PM      Component Value Range   Lactic Acid, Venous 3.6 (*) 0.5 - 2.2 (mmol/L)  CARDIAC PANEL(CRET KIN+CKTOT+MB+TROPI)     Status: Normal   Collection Time   02/11/12 10:12 PM      Component Value Range   Total CK 68  7 - 177 (U/L)   CK, MB 2.6  0.3 - 4.0 (ng/mL)   Troponin I <0.30  <0.30 (ng/mL)   Relative Index RELATIVE INDEX IS INVALID  0.0 - 2.5   D-DIMER, QUANTITATIVE     Status: Normal   Collection Time   02/11/12 10:12 PM      Component Value Range   D-Dimer, Quant 0.42  0.00 - 0.48 (ug/mL-FEU)  GLUCOSE, CAPILLARY     Status: Abnormal   Collection Time   02/12/12  1:59 AM      Component Value Range   Glucose-Capillary 122 (*) 70 - 99 (mg/dL)  COMPREHENSIVE METABOLIC PANEL     Status: Abnormal   Collection Time   02/12/12  6:28 AM      Component Value Range   Sodium 131 (*) 135 - 145 (mEq/L)   Potassium 4.3  3.5 - 5.1 (mEq/L)   Chloride 101  96 - 112 (mEq/L)   CO2 21  19 - 32 (mEq/L)   Glucose, Bld 182 (*) 70 - 99 (mg/dL)   BUN 12  6 - 23 (mg/dL)   Creatinine, Ser 9.56  0.50 - 1.10 (mg/dL)   Calcium 8.2 (*) 8.4 - 10.5 (mg/dL)   Total Protein 6.3  6.0 - 8.3 (g/dL)   Albumin 3.3 (*) 3.5 - 5.2 (g/dL)   AST 42 (*) 0 - 37 (U/L)   ALT 45 (*) 0 - 35 (U/L)   Alkaline  Phosphatase 55  39 - 117 (U/L)   Total Bilirubin 0.3  0.3 - 1.2 (mg/dL)   GFR calc non Af Amer >90  >90 (mL/min)   GFR calc Af Amer >90  >90 (mL/min)  LACTIC ACID, PLASMA     Status: Abnormal   Collection Time   02/12/12  6:28 AM      Component Value Range   Lactic Acid, Venous 2.4 (*) 0.5 - 2.2 (mmol/L)  CARDIAC PANEL(CRET KIN+CKTOT+MB+TROPI)     Status: Normal   Collection Time   02/12/12  6:28 AM      Component Value Range   Total CK 65  7 - 177 (U/L)   CK, MB 3.1  0.3 - 4.0 (ng/mL)   Troponin I <0.30  <0.30 (ng/mL)   Relative Index RELATIVE INDEX IS INVALID  0.0 - 2.5   CBC     Status: Normal   Collection Time  02/12/12  6:28 AM      Component Value Range   WBC 8.8  4.0 - 10.5 (K/uL)   RBC 4.42  3.87 - 5.11 (MIL/uL)   Hemoglobin 13.1  12.0 - 15.0 (g/dL)   HCT 16.1  09.6 - 04.5 (%)   MCV 86.0  78.0 - 100.0 (fL)   MCH 29.6  26.0 - 34.0 (pg)   MCHC 34.5  30.0 - 36.0 (g/dL)   RDW 40.9  81.1 - 91.4 (%)   Platelets 206  150 - 400 (K/uL)  DIFFERENTIAL     Status: Abnormal   Collection Time   02/12/12  6:28 AM      Component Value Range   Neutrophils Relative 83 (*) 43 - 77 (%)   Neutro Abs 7.3  1.7 - 7.7 (K/uL)   Lymphocytes Relative 15  12 - 46 (%)   Lymphs Abs 1.4  0.7 - 4.0 (K/uL)   Monocytes Relative 1 (*) 3 - 12 (%)   Monocytes Absolute 0.1  0.1 - 1.0 (K/uL)   Eosinophils Relative 0  0 - 5 (%)   Eosinophils Absolute 0.0  0.0 - 0.7 (K/uL)   Basophils Relative 0  0 - 1 (%)   Basophils Absolute 0.0  0.0 - 0.1 (K/uL)  HEMOGLOBIN A1C     Status: Abnormal   Collection Time   02/12/12  6:28 AM      Component Value Range   Hemoglobin A1C 6.4 (*) <5.7 (%)   Mean Plasma Glucose 137 (*) <117 (mg/dL)  GLUCOSE, CAPILLARY     Status: Abnormal   Collection Time   02/12/12  7:35 AM      Component Value Range   Glucose-Capillary 190 (*) 70 - 99 (mg/dL)   Comment 1 Notify RN    GLUCOSE, CAPILLARY     Status: Abnormal   Collection Time   02/12/12 11:46 AM      Component Value  Range   Glucose-Capillary 212 (*) 70 - 99 (mg/dL)   Comment 1 Notify RN    GLUCOSE, CAPILLARY     Status: Abnormal   Collection Time   02/12/12  5:01 PM      Component Value Range   Glucose-Capillary 226 (*) 70 - 99 (mg/dL)   Comment 1 Notify RN       Micro Results: No results found for this or any previous visit (from the past 240 hour(s)).  Studies/Results: Ct Abdomen Pelvis Wo Contrast  02/11/2012  *RADIOLOGY REPORT*  Clinical Data: Upper abdominal pain with nausea and vomiting.  CT ABDOMEN AND PELVIS WITHOUT CONTRAST  Technique:  Multidetector CT imaging of the abdomen and pelvis was performed following the standard protocol without intravenous contrast.  Comparison: CT scan dated 04/26/2011  Findings: There is gaseous distention of the stomach.  The duodenum is not distended. Large and small bowel appear normal.  Liver, spleen, pancreas, adrenal glands, and kidneys are normal. Gallbladder has been removed.  Uterus and ovaries have been removed.  Appendix is been removed.  No acute osseous abnormality. 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  IMPRESSION: Gaseous distention of the stomach without evidence of duodenal obstruction.  Otherwise benign-appearing abdomen and pelvis.  Original Report Authenticated By: Gwynn Burly, M.D.   Dg Chest Port 1 View  02/12/2012  *RADIOLOGY REPORT*  Clinical Data: Shortness of breath.  Abdominal pain.  PORTABLE CHEST - 1 VIEW  Comparison: 07/19/2011  Findings: Both lungs are clear.  Heart size is stable.  No evidence  of pleural effusion.  IMPRESSION: No acute findings.  Original Report Authenticated By: Danae Orleans, M.D.   Varney Biles Kayleen Memos Magnus Ivan Density Hans Eden  01/22/2012  *RADIOLOGY REPORT*  Clinical Data:  Emesis, bloating, abdominal pain.  UPPER GI SERIES WITH KUB  Technique:  Routine upper GI series was performed with thin and high density barium.  Fluoroscopy Time: 2.0 minutes  Comparison:  Abdominal radiographs 07/17/2011 and CT abdomen pelvis  04/26/2011.  Findings: Scout view of the abdomen shows a fair amount of stool in the colon.  Surgical clips are seen in the right upper quadrant.  Double contrast examination of the upper gastrointestinal tract shows normal esophageal motility.  No esophageal fold thickening, stricture or obstruction.  Tiny hiatal hernia.  Stomach and duodenal bulb are normal.  IMPRESSION:  1.  Unremarkable double contrast examination of the upper gastrointestinal tract. 2.  Constipation.  Original Report Authenticated By: Reyes Ivan, M.D.   Medications: Scheduled Meds:   . albuterol  2.5 mg Nebulization Q6H  . budesonide  0.5 mg Nebulization BID  . colesevelam  1,875 mg Oral BID WC  . cycloSPORINE  1 drop Both Eyes Daily  . insulin aspart  0-9 Units Subcutaneous TID WC  . ipratropium  0.5 mg Nebulization Q6H  . loratadine  10 mg Oral Daily  . methylPREDNISolone (SOLU-MEDROL) injection  40 mg Intravenous Once  . metoCLOPramide  5-10 mg Oral TID AC & HS  . morphine      . pantoprazole  40 mg Oral Daily  . sucralfate  1 g Oral BID WC  . triamcinolone cream   Topical TID  . DISCONTD: Colesevelam HCl  1 each Oral 1 day or 1 dose  . DISCONTD: hydrOXYzine  50 mg Oral Once   Continuous Infusions:   . sodium chloride 125 mL/hr (02/12/12 1521)  . DISCONTD: sodium chloride 125 mL/hr (02/11/12 2013)   PRN Meds:.acetaminophen, acetaminophen, albuterol, clonazePAM, dicyclomine, HYDROcodone-acetaminophen, hydrOXYzine, hyoscyamine, morphine injection, ondansetron (ZOFRAN) IV, ondansetron, tiZANidine, DISCONTD: Hydrocodone-Acetaminophen, DISCONTD: hyoscyamine  Assessment/Plan: Patient Active Hospital Problem List: Abdominal pain with diarrhea: stool studies pending.  GI consult called EGD in am.   Leukocytosis: repeat labs in am  Dm:  Hgba1c. CBG (last 3)   Basename 02/12/12 1701 02/12/12 1146 02/12/12 0735  GLUCAP 226* 212* 190*    Not well controlled. Continue with SS1. WILL add on premeal  coverage.   Asthma exacerbation: resolved.    LOS: 1 day   Lougenia Morrissey 02/12/2012, 9:06 PM

## 2012-02-12 NOTE — H&P (Signed)
[name] is an 56 y.o. female.   PCP - Dr.Plotnikov. Gastroenterologist - Dr.Outlaw. Chief Complaint: Abdominal pain. HPI: 56 year-old female with history of IBS and asthma had come to the ER because of sudden worsening of her abdominal pain. Patient states she usually has abdominal pain in the left upper quadrant. But yesterday afternoon she started developing worsening of her left upper quadrant pain which is more than usual. In addition patient also had multiple episodes of diarrhea. Patient has had fundoplication and states since cannot throw up. In addition patient also was found to have shortness of breath with wheezing. In the ER patient had a CT abdomen pelvis which only showed gastric distention without any outlet obstruction. Lactic acid was found to be high with leukocytosis. Patient's initial point of care troponin was high but regular cardiac enzymes were negative. Patient states that her shortness of breath started she did have some chest pressure which has completely resolved but still has wheezing. At this time as patient still has abdominal discomfort patient will be admitted for further management.    Past Medical History  Diagnosis Date  . Asthma   . COPD (chronic obstructive pulmonary disease)   . Depression   . GERD (gastroesophageal reflux disease)     Dr Juanda Chance  . Gastroparesis   . Migraines   . Menopause   . IBS (irritable bowel syndrome)   . Diabetes mellitus type II   . Peripheral neuropathy   . Fibromyalgia   . Obesity   . Barrett's esophagus   . MRSA (methicillin resistant Staphylococcus aureus)     possible (by verbal report from pt)  . OSA (obstructive sleep apnea)   . History of hiatal hernia     Past Surgical History  Procedure Date  . Abdominal hysterectomy     Complete  . Sinus surgery with instatrak     x3  . Surgery left heel spur   . Appendectomy   . Ventral hernia repair     with mesh  . Cholecystectomy   . Nissen fundoplication 1999    . Mouth surgery     Family History  Problem Relation Age of Onset  . Diabetes Mother   . Lung cancer Mother   . Cancer Mother     lung  . Esophageal cancer Father   . Heart disease Father   . Cancer Father     esophageal  . Diabetes Father   . Colon polyps Father     Family History  . Breast cancer Maternal Grandmother   . Heart disease Maternal Grandmother   . Diabetes Maternal Grandmother   . Cancer Maternal Grandmother     breast, colon  . Heart attack Brother 55  . Colon cancer Maternal Grandfather   . Colon polyps Maternal Grandfather   . Diabetes Maternal Grandfather   . Colon polyps Paternal Grandfather   . Colon cancer Paternal Grandfather   . Breast cancer Maternal Aunt   . Arthritis    . Hypertension    . Uterine cancer Other     great grandmother   Social History:  reports that she quit smoking about 2 years ago. She does not have any smokeless tobacco history on file. She reports that she does not drink alcohol or use illicit drugs.  Allergies:  Allergies  Allergen Reactions  . Droperidol     twitching  . Amoxicillin-Pot Clavulanate     REACTION: diarrhea  . Ciprofloxacin     sensitivity  .  Duloxetine     REACTION: diarrhea  . Hydromorphone Hcl   . Iohexol      Code: HIVES, Desc: PT developed 3 hives post injection of Omni 300. Observed by Dr Clifton James. Given 50 mg PO benedryl.Hives subsided before being released., Onset Date: [id number]   . Ketorolac Tromethamine   . Moxifloxacin   . Penicillins     sensitivity  . Pioglitazone     REACTION: weight gain  . Pregabalin     REACTION: anaphylaxis  . Pristiq (Desvenlafaxine Succinate Monohydrate)     abd pains  . Quetiapine     REACTION: nightmares  . Topiramate     Medications Prior to Admission  Medication Sig Dispense Refill  . Botulinum Toxin Type A (BOTOX) 200 UNITS SOLR Every 3 months for headaches, given by Neurologist, Dr. Neale Burly       . cetirizine (ZYRTEC HIVES RELIEF) 10 MG  tablet Take 10 mg by mouth. As needed      . clonazePAM (KLONOPIN) 1 MG tablet Take 1 tablet (1 mg total) by mouth 3 (three) times daily as needed.  90 tablet  3  . Colesevelam HCl (WELCHOL) 3.75 G PACK Take 1 each by mouth 1 day or 1 dose.  30 each  11  . conjugated estrogens (PREMARIN) vaginal cream Place vaginally daily. Use pv 2-3/wk  42.5 g  12  . cycloSPORINE (RESTASIS) 0.05 % ophthalmic emulsion Place 1 drop into both eyes daily.        Marland Kitchen dicyclomine (BENTYL) 20 MG tablet Take 1 tablet (20 mg total) by mouth 3 (three) times daily as needed.  90 tablet  3  . esomeprazole (NEXIUM) 20 MG capsule Take 1 capsule (20 mg total) by mouth daily before breakfast.  90 capsule  3  . furosemide (LASIX) 20 MG tablet Take 20 mg by mouth daily. As needed       . Hydrocodone-Acetaminophen 10-660 MG TABS Take 1 tablet by mouth 4 (four) times daily as needed (for pain).  120 each  2  . hyoscyamine (LEVSIN SL) 0.125 MG SL tablet Place 0.125 mg under the tongue every 4 (four) hours as needed.        Marland Kitchen ipratropium-albuterol (DUONEB) 0.5-2.5 (3) MG/3ML SOLN USE 1 VIAL IN NEBULIZER 4 TIMES DAILY AS NEEDED  180 mL  3  . metFORMIN (GLUCOPHAGE) 500 MG tablet Take 1 tablet (500 mg total) by mouth 2 (two) times daily with a meal.  180 tablet  3  . metoCLOPramide (REGLAN) 10 MG tablet Take 0.5-1 tablets (5-10 mg total) by mouth 4 (four) times daily. Use qid ac and at hs  120 tablet  3  . metroNIDAZOLE (METROGEL) 0.75 % vaginal gel Place 1 Applicatorful vaginally 2 (two) times daily.  70 g  1  . PATADAY 0.2 % SOLN Use everyday      . PREMARIN 0.9 MG tablet TAKE 1 TABLET BY MOUTH EVERY DAY  30 tablet  5  . sucralfate (CARAFATE) 1 G tablet Take 1 g by mouth 2 (two) times daily.        Marland Kitchen tiZANidine (ZANAFLEX) 4 MG tablet Take 1 tablet (4 mg total) by mouth every 8 (eight) hours as needed.  90 tablet  1  . triamcinolone (KENALOG) 0.1 % cream Apply topically 3 (three) times daily. As needed for rash  45 g  1  . albuterol  (PROAIR HFA) 108 (90 BASE) MCG/ACT inhaler Inhale 2 puffs into the lungs every 4 (four) hours as  needed.  3.7 g  3  . glucose blood test strip Use as instructed  100 each  3  . hydrOXYzine (VISTARIL) 50 MG capsule Take 1 capsule (50 mg total) by mouth once. 2 tab by mouth as needed for insomnia  90 capsule  3  . Probiotic Product (ALIGN) 4 MG CAPS Take 4 mg by mouth as needed. Daily      . promethazine (PHENERGAN) 25 MG tablet Take 1-2 tablets every 6 hours prn  90 tablet  0    Results for orders placed during the hospital encounter of 02/11/12 (from the past 48 hour(s))  COMPREHENSIVE METABOLIC PANEL     Status: Abnormal   Collection Time   02/11/12  6:18 PM      Component Value Range Comment   Sodium 139  135 - 145 (mEq/L)    Potassium 3.7  3.5 - 5.1 (mEq/L)    Chloride 102  96 - 112 (mEq/L)    CO2 22  19 - 32 (mEq/L)    Glucose, Bld 134 (*) 70 - 99 (mg/dL)    BUN 12  6 - 23 (mg/dL)    Creatinine, Ser 1.61  0.50 - 1.10 (mg/dL)    Calcium 9.6  8.4 - 10.5 (mg/dL)    Total Protein 7.4  6.0 - 8.3 (g/dL)    Albumin 3.9  3.5 - 5.2 (g/dL)    AST 22  0 - 37 (U/L)    ALT 23  0 - 35 (U/L)    Alkaline Phosphatase 63  39 - 117 (U/L)    Total Bilirubin 0.3  0.3 - 1.2 (mg/dL)    GFR calc non Af Amer >90  >90 (mL/min)    GFR calc Af Amer >90  >90 (mL/min)   CBC     Status: Abnormal   Collection Time   02/11/12  6:18 PM      Component Value Range Comment   WBC 14.8 (*) 4.0 - 10.5 (K/uL)    RBC 5.00  3.87 - 5.11 (MIL/uL)    Hemoglobin 15.0  12.0 - 15.0 (g/dL)    HCT 09.6  04.5 - 40.9 (%)    MCV 86.2  78.0 - 100.0 (fL)    MCH 30.0  26.0 - 34.0 (pg)    MCHC 34.8  30.0 - 36.0 (g/dL)    RDW 81.1  91.4 - 78.2 (%)    Platelets 254  150 - 400 (K/uL)   DIFFERENTIAL     Status: Abnormal   Collection Time   02/11/12  6:18 PM      Component Value Range Comment   Neutrophils Relative 65  43 - 77 (%)    Neutro Abs 9.6 (*) 1.7 - 7.7 (K/uL)    Lymphocytes Relative 28  12 - 46 (%)    Lymphs Abs 4.2 (*)  0.7 - 4.0 (K/uL)    Monocytes Relative 5  3 - 12 (%)    Monocytes Absolute 0.7  0.1 - 1.0 (K/uL)    Eosinophils Relative 2  0 - 5 (%)    Eosinophils Absolute 0.3  0.0 - 0.7 (K/uL)    Basophils Relative 0  0 - 1 (%)    Basophils Absolute 0.1  0.0 - 0.1 (K/uL)   LIPASE, BLOOD     Status: Normal   Collection Time   02/11/12  6:18 PM      Component Value Range Comment   Lipase 30  11 - 59 (U/L)   POCT I-STAT TROPONIN  I     Status: Abnormal   Collection Time   02/11/12  6:54 PM      Component Value Range Comment   Troponin i, poc 0.31 (*) 0.00 - 0.08 (ng/mL)    Comment NOTIFIED PHYSICIAN      Comment 3            LACTIC ACID, PLASMA     Status: Abnormal   Collection Time   02/11/12  9:13 PM      Component Value Range Comment   Lactic Acid, Venous 3.6 (*) 0.5 - 2.2 (mmol/L)   CARDIAC PANEL(CRET KIN+CKTOT+MB+TROPI)     Status: Normal   Collection Time   02/11/12 10:12 PM      Component Value Range Comment   Total CK 68  7 - 177 (U/L)    CK, MB 2.6  0.3 - 4.0 (ng/mL)    Troponin I <0.30  <0.30 (ng/mL)    Relative Index RELATIVE INDEX IS INVALID  0.0 - 2.5    D-DIMER, QUANTITATIVE     Status: Normal   Collection Time   02/11/12 10:12 PM      Component Value Range Comment   D-Dimer, Quant 0.42  0.00 - 0.48 (ug/mL-FEU)    Ct Abdomen Pelvis Wo Contrast  02/11/2012  *RADIOLOGY REPORT*  Clinical Data: Upper abdominal pain with nausea and vomiting.  CT ABDOMEN AND PELVIS WITHOUT CONTRAST  Technique:  Multidetector CT imaging of the abdomen and pelvis was performed following the standard protocol without intravenous contrast.  Comparison: CT scan dated 04/26/2011  Findings: There is gaseous distention of the stomach.  The duodenum is not distended. Large and small bowel appear normal.  Liver, spleen, pancreas, adrenal glands, and kidneys are normal. Gallbladder has been removed.  Uterus and ovaries have been removed.  Appendix is been removed.  No acute osseous abnormality. I have evidence of  previous Nissen fundoplication.  IMPRESSION: Gaseous distention of the stomach without evidence of duodenal obstruction.  Otherwise benign-appearing abdomen and pelvis.  Original Report Authenticated By: Gwynn Burly, M.D.    Review of Systems  Constitutional: Negative.   HENT: Negative.   Eyes: Negative.   Respiratory: Positive for shortness of breath.   Cardiovascular: Negative.   Gastrointestinal: Positive for abdominal pain and diarrhea.  Genitourinary: Negative.   Musculoskeletal: Negative.   Skin: Negative.   Neurological: Negative.   Endo/Heme/Allergies: Negative.   Psychiatric/Behavioral: Negative.     Blood pressure 117/75, pulse 85, temperature 98 F (36.7 C), temperature source Oral, resp. rate 17, height 5\' 5"  (1.651 m), weight 113.399 kg (250 lb), SpO2 96.00%. Physical Exam  Constitutional: She is oriented to person, place, and time. She appears well-developed and well-nourished. No distress.  HENT:  Head: Normocephalic and atraumatic.  Right Ear: External ear normal.  Left Ear: External ear normal.  Nose: Nose normal.  Mouth/Throat: Oropharynx is clear and moist. No oropharyngeal exudate.  Eyes: Conjunctivae are normal. Pupils are equal, round, and reactive to light. Right eye exhibits no discharge. Left eye exhibits no discharge. No scleral icterus.  Neck: Normal range of motion. Neck supple.  Cardiovascular: Normal rate and regular rhythm.   Respiratory: No respiratory distress. She has wheezes. She has no rales.  GI: Soft. Bowel sounds are normal. She exhibits no distension. There is no tenderness. There is no rebound.  Musculoskeletal: Normal range of motion. She exhibits no edema and no tenderness.  Neurological: She is alert and oriented to person, place, and time.  Moves all extremities.  Skin: Skin is warm and dry. No rash noted. She is not diaphoretic. No erythema.  Psychiatric: Her behavior is normal.     Assessment/Plan  #1. Abdominal pain  with history of IBS - patient states she has been recently on antibiotics for bronchitis 4 weeks ago. And states she does usually diarrhea but this is more than normal. We will check stool for C. difficile PCR and cultures. And since lactic acid has been high we will hydrate and recheck lactic acid in a.m. Keep patient on her regular home medications along with pain relief medications. May consider GI consult if symptoms do not improve. #2. Shortness of breath with wheezing most likely from asthma exacerbation - patient is saturating normal at room air and is not in acute distress. We will continue with nebulizers and Pulmicort and give one dose of Solu-Medrol. #3. Leukocytosis - patient is afebrile. Recheck CBC. And also we are checking C. Difficile. #4. Diabetes mellitus2 - place patient on sliding scale coverage. Hold metformin due to high lactic acid levels. #5. History of fibromyalgia - continue her home medications.  CODE STATUS - full code.  Jonathyn Carothers N. 02/12/2012, 1:13 AM

## 2012-02-12 NOTE — Progress Notes (Signed)
Inpatient Diabetes Program Recommendations  AACE/ADA: New Consensus Statement on Inpatient Glycemic Control (2009)  Target Ranges:  Prepandial:   less than 140 mg/dL      Peak postprandial:   less than 180 mg/dL (1-2 hours)      Critically ill patients:  140 - 180 mg/dL   Reason for Visit: Hyiperglycemia throughout the day  Inpatient Diabetes Program Recommendations Insulin - Basal: Please add 10 units Lantus while here. HgbA1C: Please recheck. last result was 6.9 in Jan of 2013. Thank you, Lenor Coffin, RN, CNS, Diabetes Coordinator [phone])

## 2012-02-12 NOTE — Care Management Note (Signed)
  Page 1 of 1   02/12/2012     3:39:08 PM   CARE MANAGEMENT NOTE 02/12/2012  Patient:  [name], [name]   Account Number:  [id number]  Date Initiated:  02/12/2012  Documentation initiated by:  Ronny Flurry  Subjective/Objective Assessment:   DX: abdominal pain     Action/Plan:   Anticipated DC Date:  02/13/2012   Anticipated DC Plan:  HOME/SELF CARE         Choice offered to / List presented to:             Status of service:  In process, will continue to follow Medicare Important Message given?   (If response is "NO", the following Medicare IM given date fields will be blank) Date Medicare IM given:   Date Additional Medicare IM given:    Discharge Disposition:    Per UR Regulation:  Reviewed for med. necessity/level of care/duration of stay  If discussed at Long Length of Stay Meetings, dates discussed:    Comments:

## 2012-02-13 ENCOUNTER — Encounter (HOSPITAL_COMMUNITY)
Admission: EM | Disposition: A | Discharge status: Home / Self Care | Payer: Self-pay | Source: Ambulatory Visit | Attending: Internal Medicine

## 2012-02-13 ENCOUNTER — Encounter (HOSPITAL_COMMUNITY): Payer: Self-pay | Admitting: *Deleted

## 2012-02-13 DIAGNOSIS — E1165 Type 2 diabetes mellitus with hyperglycemia: Secondary | ICD-10-CM

## 2012-02-13 DIAGNOSIS — J45901 Unspecified asthma with (acute) exacerbation: Secondary | ICD-10-CM

## 2012-02-13 DIAGNOSIS — K589 Irritable bowel syndrome without diarrhea: Secondary | ICD-10-CM

## 2012-02-13 DIAGNOSIS — A0472 Enterocolitis due to Clostridium difficile, not specified as recurrent: Secondary | ICD-10-CM

## 2012-02-13 HISTORY — PX: ESOPHAGOGASTRODUODENOSCOPY: SHX5428

## 2012-02-13 LAB — GLUCOSE, CAPILLARY
Glucose-Capillary: 123 mg/dL — ABNORMAL HIGH (ref 70–99)
Glucose-Capillary: 113 mg/dL — ABNORMAL HIGH (ref 70–99)

## 2012-02-13 LAB — CLOSTRIDIUM DIFFICILE BY PCR: Toxigenic C. Difficile by PCR: POSITIVE — AB

## 2012-02-13 SURGERY — EGD (ESOPHAGOGASTRODUODENOSCOPY)
Anesthesia: Moderate Sedation

## 2012-02-13 MED ORDER — DIPHENHYDRAMINE HCL 50 MG/ML IJ SOLN
INTRAMUSCULAR | Status: DC | PRN
  Administered 2012-02-13: 25 mg via INTRAVENOUS

## 2012-02-13 MED ORDER — DIPHENHYDRAMINE HCL 50 MG/ML IJ SOLN
INTRAMUSCULAR | Status: AC
  Filled 2012-02-13: qty 1

## 2012-02-13 MED ORDER — FENTANYL CITRATE 0.05 MG/ML IJ SOLN
INTRAMUSCULAR | Status: AC
  Filled 2012-02-13: qty 4

## 2012-02-13 MED ORDER — MIDAZOLAM HCL 10 MG/2ML IJ SOLN
INTRAMUSCULAR | Status: DC | PRN
  Administered 2012-02-13 (×6): 2 mg via INTRAVENOUS

## 2012-02-13 MED ORDER — SODIUM CHLORIDE 0.9 % IV SOLN: Freq: Once | INTRAVENOUS | Status: DC

## 2012-02-13 MED ORDER — HYDROXYZINE HCL 50 MG PO TABS
50.0000 mg | ORAL_TABLET | Freq: Every evening | ORAL | Status: DC | PRN
  Administered 2012-02-13: 50 mg via ORAL
  Filled 2012-02-13: qty 2

## 2012-02-13 MED ORDER — METRONIDAZOLE 500 MG PO TABS
500.0000 mg | ORAL_TABLET | Freq: Three times a day (TID) | ORAL | Status: DC
  Administered 2012-02-13 – 2012-02-15 (×5): 500 mg via ORAL
  Filled 2012-02-13 (×10): qty 1

## 2012-02-13 MED ORDER — BUTAMBEN-TETRACAINE-BENZOCAINE 2-2-14 % EX AERO
INHALATION_SPRAY | CUTANEOUS | Status: DC | PRN
  Administered 2012-02-13: 2 via TOPICAL

## 2012-02-13 MED ORDER — FENTANYL NICU IV SYRINGE 50 MCG/ML
INJECTION | INTRAMUSCULAR | Status: DC | PRN
  Administered 2012-02-13 (×6): 25 ug via INTRAVENOUS

## 2012-02-13 MED ORDER — MIDAZOLAM HCL 10 MG/2ML IJ SOLN
INTRAMUSCULAR | Status: AC
  Filled 2012-02-13: qty 4

## 2012-02-13 NOTE — Op Note (Signed)
[facility] [address] [place], [place]  [address]  ENDOSCOPY PROCEDURE REPORT  PATIENT:  [name], [name]  MR#:  [id number] BIRTHDATE:  Dec 19, 1955, 55 yrs. old  GENDER:  female ENDOSCOPIST:  Willis Modena, MD REFERRING:  Rod Can, MD ([facility]) Howell Pringle, MD  PROCEDURE DATE:  02/13/2012 PROCEDURE:  EGD with biopsy, 62[address] ASA CLASS:  Class III INDICATIONS:  nausea, vomiting, LUQ pain  MEDICATIONS:  Cetacaine spray x 2, Fentanyl 125 mcg IV, Versed 12 mg IV, Benadryl 25 mg IV DESCRIPTION OF PROCEDURE:   After the risks benefits and alternatives of the procedure were thoroughly explained, informed consent was obtained.  The [id number] ([id number]) endoscope was introduced through the mouth and advanced to the second portion of the duodenum, without limitations.  The instrument was slowly withdrawn as the mucosa was fully examined. <<PROCEDUREIMAGES>>  FINDINGS:  Mild distal esophagitis.  Mild gastritis, biopsied. Post-Nissen fundoplication, seemingly intact.  Otherwise normal stomach, pylorus, and duodenum to the second portion.  No evidence of gastric outlet obstruction.  ENDOSCOPIC IMPRESSION:  1.  As above. 2.  No obvious cause of patient's symptoms.  RECOMMENDATIONS:    1.  Watch for potential complications of procedure. 2. Await biopsies. 3.  Continue PPI therapy. 4.  Gastric emptying study tomorrow.  REPEAT EXAM:  No  ______________________________ Willis Modena  CC:  n. eSIGNEDWillis Modena at 02/13/2012 09:39 AM  Karle Starch, [phone]

## 2012-02-13 NOTE — Progress Notes (Signed)
Subjective: She feels relieved today. Abdominal pain is better. She wanted to go home earlier, before she knew she had c diff, now she says she will stay and get the GES in the morning.   Objective: Weight change:   Intake/Output Summary (Last 24 hours) at 02/13/12 1731 Last data filed at 02/13/12 1400  Gross per 24 hour  Intake 3595.5 ml  Output      0 ml  Net 3595.5 ml    Filed Vitals:   02/13/12 1329  BP: 103/59  Pulse: 67  Temp: 97.7 F (36.5 C)  Resp: 18   She is alert afebrile comfortable  CVS S1S2 HEARD  Lungs clear  Abdomen: soft mild gen tenderness. Bs+  Extemities: trace pedal edema.   Lab Results: Results for orders placed during the hospital encounter of 02/11/12 (from the past 24 hour(s))  GLUCOSE, CAPILLARY     Status: Abnormal   Collection Time   02/12/12 10:17 PM      Component Value Range   Glucose-Capillary 135 (*) 70 - 99 (mg/dL)   Comment 1 Documented in Chart     Comment 2 Notify RN    GLUCOSE, CAPILLARY     Status: Abnormal   Collection Time   02/13/12  8:02 AM      Component Value Range   Glucose-Capillary 106 (*) 70 - 99 (mg/dL)   Comment 1 Notify RN    GLUCOSE, CAPILLARY     Status: Abnormal   Collection Time   02/13/12  9:00 AM      Component Value Range   Glucose-Capillary 123 (*) 70 - 99 (mg/dL)  CLOSTRIDIUM DIFFICILE BY PCR     Status: Abnormal   Collection Time   02/13/12 11:32 AM      Component Value Range   C difficile by pcr POSITIVE (*) NEGATIVE   GLUCOSE, CAPILLARY     Status: Abnormal   Collection Time   02/13/12 12:14 PM      Component Value Range   Glucose-Capillary 131 (*) 70 - 99 (mg/dL)   Comment 1 Notify RN    GLUCOSE, CAPILLARY     Status: Abnormal   Collection Time   02/13/12  5:16 PM      Component Value Range   Glucose-Capillary 100 (*) 70 - 99 (mg/dL)   Comment 1 Notify RN       Micro Results: Recent Results (from the past 240 hour(s))  CLOSTRIDIUM DIFFICILE BY PCR     Status: Abnormal   Collection Time     02/13/12 11:32 AM      Component Value Range Status Comment   C difficile by pcr POSITIVE (*) NEGATIVE  Final     Studies/Results: Ct Abdomen Pelvis Wo Contrast  02/11/2012  *RADIOLOGY REPORT*  Clinical Data: Upper abdominal pain with nausea and vomiting.  CT ABDOMEN AND PELVIS WITHOUT CONTRAST  Technique:  Multidetector CT imaging of the abdomen and pelvis was performed following the standard protocol without intravenous contrast.  Comparison: CT scan dated 04/26/2011  Findings: There is gaseous distention of the stomach.  The duodenum is not distended. Large and small bowel appear normal.  Liver, spleen, pancreas, adrenal glands, and kidneys are normal. Gallbladder has been removed.  Uterus and ovaries have been removed.  Appendix is been removed.  No acute osseous abnormality. 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  IMPRESSION: Gaseous distention of the stomach without evidence of duodenal obstruction.  Otherwise benign-appearing abdomen and pelvis.  Original Report Authenticated By:  Gwynn Burly, M.D.   Dg Chest Port 1 View  02/12/2012  *RADIOLOGY REPORT*  Clinical Data: Shortness of breath.  Abdominal pain.  PORTABLE CHEST - 1 VIEW  Comparison: 07/19/2011  Findings: Both lungs are clear.  Heart size is stable.  No evidence of pleural effusion.  IMPRESSION: No acute findings.  Original Report Authenticated By: Danae Orleans, M.D.   Varney Biles Kayleen Memos Magnus Ivan Density Hans Eden  01/22/2012  *RADIOLOGY REPORT*  Clinical Data:  Emesis, bloating, abdominal pain.  UPPER GI SERIES WITH KUB  Technique:  Routine upper GI series was performed with thin and high density barium.  Fluoroscopy Time: 2.0 minutes  Comparison:  Abdominal radiographs 07/17/2011 and CT abdomen pelvis 04/26/2011.  Findings: Scout view of the abdomen shows a fair amount of stool in the colon.  Surgical clips are seen in the right upper quadrant.  Double contrast examination of the upper gastrointestinal tract shows normal esophageal  motility.  No esophageal fold thickening, stricture or obstruction.  Tiny hiatal hernia.  Stomach and duodenal bulb are normal.  IMPRESSION:  1.  Unremarkable double contrast examination of the upper gastrointestinal tract. 2.  Constipation.  Original Report Authenticated By: Reyes Ivan, M.D.   Medications: Scheduled Meds:   . albuterol  2.5 mg Nebulization Q6H  . budesonide  0.5 mg Nebulization BID  . cycloSPORINE  1 drop Both Eyes Daily  . insulin aspart  0-9 Units Subcutaneous TID WC  . ipratropium  0.5 mg Nebulization Q6H  . loratadine  10 mg Oral Daily  . metoCLOPramide  5-10 mg Oral TID AC & HS  . metroNIDAZOLE  500 mg Oral Q8H  . pantoprazole  40 mg Oral Daily  . sucralfate  1 g Oral BID WC  . triamcinolone cream   Topical TID  . DISCONTD: sodium chloride   Intravenous Once  . DISCONTD: colesevelam  1,875 mg Oral BID WC   Continuous Infusions:   . sodium chloride 125 mL/hr at 02/12/12 2328   PRN Meds:.acetaminophen, acetaminophen, albuterol, clonazePAM, dicyclomine, HYDROcodone-acetaminophen, hydrOXYzine, hyoscyamine, morphine injection, ondansetron (ZOFRAN) IV, ondansetron, tiZANidine, DISCONTD: butamben-tetracaine-benzocaine, DISCONTD: diphenhydrAMINE, DISCONTD: fentaNYL, DISCONTD: midazolam   Interim Summary:  56 year-old female with history of IBS and asthma had come to the ER because of sudden worsening of her abdominal pain. She reports that she multiple episodes of abdominal pain, and had extensive work up in the past, but the abdominal pain hasn't been relieved. She was also treated with flagyl and rifaximin empirically in the past. But she was never worked up for C diff. She requested to see Dr Dulce Sellar yesterday. I have consulted GI and  She underwent a EGD today which showed mild gastritis of stomach and esophagus. CT abdomen and pelvis showed gaseous distension. She is being worked up for possible porphyria for the chronic intermittent abdominal pain. She is also  scheduled for Gastric Emptying Study.    Assessment/Plan: Patient Active Hospital Problem List:  1.  Chronic abdominal pain: is being evaluated for AIP, Urine porphyrin screen is in process. CT  abd and pelvis shows gaseous distension of stomach. She underwent EGD today and showed mild gastritis, esophagitis and s/p Fundoplication which is intact. She underwent biopsies and on PPI therapy.  She is scheduled for Gastric emptying Study tomorrow.   2. Intermittent Diarrhea; her C diff PCR came positive. She was started on oral flagyl. Stool culture is pending.   3. Diabetes mellitus:   CBG (last 3)   Basename 02/13/12 1716 02/13/12 1214  02/13/12 0900  GLUCAP 100* 131* 123*    -Better controlled.  -continue with SSI. -HGBA1C is 6.4  4. Asthma Exacerbation:  -continue with pulmicort and nebs as needed.   5. DVT prophylaxis FULL CODE.   LOS: 2 days   Akshath Mccarey 02/13/2012, 5:31 PM

## 2012-02-13 NOTE — H&P (View-Only) (Signed)
Subjective: Frustrated that stool sample could not be sent. Abdominal pain better   Objective: Weight change:   Intake/Output Summary (Last 24 hours) at 02/12/12 2106 Last data filed at 02/12/12 2035  Gross per 24 hour  Intake 2895.83 ml  Output   1030 ml  Net 1865.83 ml    Filed Vitals:   02/12/12 1400  BP: 142/83  Pulse: 88  Temp: 97.9 F (36.6 C)  Resp: 22   On exam  She is alert afebrile comfortable CVS S1S2 HEARD Lungs clear Abdomen: soft mild gen tenderness. Bs+ Extemities: trace pedal edema.  Lab Results: Results for orders placed during the hospital encounter of 02/11/12 (from the past 24 hour(s))  LACTIC ACID, PLASMA     Status: Abnormal   Collection Time   02/11/12  9:13 PM      Component Value Range   Lactic Acid, Venous 3.6 (*) 0.5 - 2.2 (mmol/L)  CARDIAC PANEL(CRET KIN+CKTOT+MB+TROPI)     Status: Normal   Collection Time   02/11/12 10:12 PM      Component Value Range   Total CK 68  7 - 177 (U/L)   CK, MB 2.6  0.3 - 4.0 (ng/mL)   Troponin I <0.30  <0.30 (ng/mL)   Relative Index RELATIVE INDEX IS INVALID  0.0 - 2.5   D-DIMER, QUANTITATIVE     Status: Normal   Collection Time   02/11/12 10:12 PM      Component Value Range   D-Dimer, Quant 0.42  0.00 - 0.48 (ug/mL-FEU)  GLUCOSE, CAPILLARY     Status: Abnormal   Collection Time   02/12/12  1:59 AM      Component Value Range   Glucose-Capillary 122 (*) 70 - 99 (mg/dL)  COMPREHENSIVE METABOLIC PANEL     Status: Abnormal   Collection Time   02/12/12  6:28 AM      Component Value Range   Sodium 131 (*) 135 - 145 (mEq/L)   Potassium 4.3  3.5 - 5.1 (mEq/L)   Chloride 101  96 - 112 (mEq/L)   CO2 21  19 - 32 (mEq/L)   Glucose, Bld 182 (*) 70 - 99 (mg/dL)   BUN 12  6 - 23 (mg/dL)   Creatinine, Ser 0.56  0.50 - 1.10 (mg/dL)   Calcium 8.2 (*) 8.4 - 10.5 (mg/dL)   Total Protein 6.3  6.0 - 8.3 (g/dL)   Albumin 3.3 (*) 3.5 - 5.2 (g/dL)   AST 42 (*) 0 - 37 (U/L)   ALT 45 (*) 0 - 35 (U/L)   Alkaline  Phosphatase 55  39 - 117 (U/L)   Total Bilirubin 0.3  0.3 - 1.2 (mg/dL)   GFR calc non Af Amer >90  >90 (mL/min)   GFR calc Af Amer >90  >90 (mL/min)  LACTIC ACID, PLASMA     Status: Abnormal   Collection Time   02/12/12  6:28 AM      Component Value Range   Lactic Acid, Venous 2.4 (*) 0.5 - 2.2 (mmol/L)  CARDIAC PANEL(CRET KIN+CKTOT+MB+TROPI)     Status: Normal   Collection Time   02/12/12  6:28 AM      Component Value Range   Total CK 65  7 - 177 (U/L)   CK, MB 3.1  0.3 - 4.0 (ng/mL)   Troponin I <0.30  <0.30 (ng/mL)   Relative Index RELATIVE INDEX IS INVALID  0.0 - 2.5   CBC     Status: Normal   Collection Time     02/12/12  6:28 AM      Component Value Range   WBC 8.8  4.0 - 10.5 (K/uL)   RBC 4.42  3.87 - 5.11 (MIL/uL)   Hemoglobin 13.1  12.0 - 15.0 (g/dL)   HCT 38.0  36.0 - 46.0 (%)   MCV 86.0  78.0 - 100.0 (fL)   MCH 29.6  26.0 - 34.0 (pg)   MCHC 34.5  30.0 - 36.0 (g/dL)   RDW 12.6  11.5 - 15.5 (%)   Platelets 206  150 - 400 (K/uL)  DIFFERENTIAL     Status: Abnormal   Collection Time   02/12/12  6:28 AM      Component Value Range   Neutrophils Relative 83 (*) 43 - 77 (%)   Neutro Abs 7.3  1.7 - 7.7 (K/uL)   Lymphocytes Relative 15  12 - 46 (%)   Lymphs Abs 1.4  0.7 - 4.0 (K/uL)   Monocytes Relative 1 (*) 3 - 12 (%)   Monocytes Absolute 0.1  0.1 - 1.0 (K/uL)   Eosinophils Relative 0  0 - 5 (%)   Eosinophils Absolute 0.0  0.0 - 0.7 (K/uL)   Basophils Relative 0  0 - 1 (%)   Basophils Absolute 0.0  0.0 - 0.1 (K/uL)  HEMOGLOBIN A1C     Status: Abnormal   Collection Time   02/12/12  6:28 AM      Component Value Range   Hemoglobin A1C 6.4 (*) <5.7 (%)   Mean Plasma Glucose 137 (*) <117 (mg/dL)  GLUCOSE, CAPILLARY     Status: Abnormal   Collection Time   02/12/12  7:35 AM      Component Value Range   Glucose-Capillary 190 (*) 70 - 99 (mg/dL)   Comment 1 Notify RN    GLUCOSE, CAPILLARY     Status: Abnormal   Collection Time   02/12/12 11:46 AM      Component Value  Range   Glucose-Capillary 212 (*) 70 - 99 (mg/dL)   Comment 1 Notify RN    GLUCOSE, CAPILLARY     Status: Abnormal   Collection Time   02/12/12  5:01 PM      Component Value Range   Glucose-Capillary 226 (*) 70 - 99 (mg/dL)   Comment 1 Notify RN       Micro Results: No results found for this or any previous visit (from the past 240 hour(s)).  Studies/Results: Ct Abdomen Pelvis Wo Contrast  02/11/2012  *RADIOLOGY REPORT*  Clinical Data: Upper abdominal pain with nausea and vomiting.  CT ABDOMEN AND PELVIS WITHOUT CONTRAST  Technique:  Multidetector CT imaging of the abdomen and pelvis was performed following the standard protocol without intravenous contrast.  Comparison: CT scan dated 04/26/2011  Findings: There is gaseous distention of the stomach.  The duodenum is not distended. Large and small bowel appear normal.  Liver, spleen, pancreas, adrenal glands, and kidneys are normal. Gallbladder has been removed.  Uterus and ovaries have been removed.  Appendix is been removed.  No acute osseous abnormality. 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  IMPRESSION: Gaseous distention of the stomach without evidence of duodenal obstruction.  Otherwise benign-appearing abdomen and pelvis.  Original Report Authenticated By: JAMES H. MAXWELL, M.D.   Dg Chest Port 1 View  02/12/2012  *RADIOLOGY REPORT*  Clinical Data: Shortness of breath.  Abdominal pain.  PORTABLE CHEST - 1 VIEW  Comparison: 07/19/2011  Findings: Both lungs are clear.  Heart size is stable.  No evidence   of pleural effusion.  IMPRESSION: No acute findings.  Original Report Authenticated By: JOHN A. STAHL, M.D.   Dg Ugi W/high Density W/kub  01/22/2012  *RADIOLOGY REPORT*  Clinical Data:  Emesis, bloating, abdominal pain.  UPPER GI SERIES WITH KUB  Technique:  Routine upper GI series was performed with thin and high density barium.  Fluoroscopy Time: 2.0 minutes  Comparison:  Abdominal radiographs 07/17/2011 and CT abdomen pelvis  04/26/2011.  Findings: Scout view of the abdomen shows a fair amount of stool in the colon.  Surgical clips are seen in the right upper quadrant.  Double contrast examination of the upper gastrointestinal tract shows normal esophageal motility.  No esophageal fold thickening, stricture or obstruction.  Tiny hiatal hernia.  Stomach and duodenal bulb are normal.  IMPRESSION:  1.  Unremarkable double contrast examination of the upper gastrointestinal tract. 2.  Constipation.  Original Report Authenticated By: MELINDA A. BLIETZ, M.D.   Medications: Scheduled Meds:   . albuterol  2.5 mg Nebulization Q6H  . budesonide  0.5 mg Nebulization BID  . colesevelam  1,875 mg Oral BID WC  . cycloSPORINE  1 drop Both Eyes Daily  . insulin aspart  0-9 Units Subcutaneous TID WC  . ipratropium  0.5 mg Nebulization Q6H  . loratadine  10 mg Oral Daily  . methylPREDNISolone (SOLU-MEDROL) injection  40 mg Intravenous Once  . metoCLOPramide  5-10 mg Oral TID AC & HS  . morphine      . pantoprazole  40 mg Oral Daily  . sucralfate  1 g Oral BID WC  . triamcinolone cream   Topical TID  . DISCONTD: Colesevelam HCl  1 each Oral 1 day or 1 dose  . DISCONTD: hydrOXYzine  50 mg Oral Once   Continuous Infusions:   . sodium chloride 125 mL/hr (02/12/12 1521)  . DISCONTD: sodium chloride 125 mL/hr (02/11/12 2013)   PRN Meds:.acetaminophen, acetaminophen, albuterol, clonazePAM, dicyclomine, HYDROcodone-acetaminophen, hydrOXYzine, hyoscyamine, morphine injection, ondansetron (ZOFRAN) IV, ondansetron, tiZANidine, DISCONTD: Hydrocodone-Acetaminophen, DISCONTD: hyoscyamine  Assessment/Plan: Patient Active Hospital Problem List: Abdominal pain with diarrhea: stool studies pending.  GI consult called EGD in am.   Leukocytosis: repeat labs in am  Dm:  Hgba1c. CBG (last 3)   Basename 02/12/12 1701 02/12/12 1146 02/12/12 0735  GLUCAP 226* 212* 190*    Not well controlled. Continue with SS1. WILL add on premeal  coverage.   Asthma exacerbation: resolved.    LOS: 1 day   Natassja Ollis 02/12/2012, 9:06 PM   

## 2012-02-13 NOTE — Interval H&P Note (Signed)
History and Physical Interval Note:  02/13/2012 8:54 AM  [name]  has presented today for surgery, with the diagnosis of abdominal pain  The various methods of treatment have been discussed with the patient and family. After consideration of risks, benefits and other options for treatment, the patient has consented to  Procedure(s) (LRB): ESOPHAGOGASTRODUODENOSCOPY (EGD) (N/A) as a surgical intervention .  The patients' history has been reviewed, patient examined, no change in status, stable for surgery.  I have reviewed the patients' chart and labs.  Questions were answered to the patient's satisfaction.     Freddy Jaksch

## 2012-02-14 ENCOUNTER — Encounter (HOSPITAL_COMMUNITY): Payer: Self-pay | Admitting: Gastroenterology

## 2012-02-14 ENCOUNTER — Inpatient Hospital Stay (HOSPITAL_COMMUNITY): Payer: 59

## 2012-02-14 DIAGNOSIS — K589 Irritable bowel syndrome without diarrhea: Secondary | ICD-10-CM

## 2012-02-14 DIAGNOSIS — E1165 Type 2 diabetes mellitus with hyperglycemia: Secondary | ICD-10-CM

## 2012-02-14 DIAGNOSIS — A0472 Enterocolitis due to Clostridium difficile, not specified as recurrent: Secondary | ICD-10-CM

## 2012-02-14 DIAGNOSIS — J45901 Unspecified asthma with (acute) exacerbation: Secondary | ICD-10-CM

## 2012-02-14 DIAGNOSIS — IMO0001 Reserved for inherently not codable concepts without codable children: Secondary | ICD-10-CM

## 2012-02-14 LAB — GI PATHOGEN PANEL BY PCR, STOOL
Cryptosporidium by PCR: NEGATIVE
G lamblia by PCR: NEGATIVE
Norovirus GI/GII: NEGATIVE
Rotavirus A by PCR: NEGATIVE
Salmonella by PCR: NEGATIVE
Shigella by PCR: NEGATIVE

## 2012-02-14 LAB — GLUCOSE, CAPILLARY

## 2012-02-14 MED ORDER — AZITHROMYCIN 500 MG PO TABS
500.0000 mg | ORAL_TABLET | Freq: Once | ORAL | Status: AC
  Administered 2012-02-14: 500 mg via ORAL
  Filled 2012-02-14: qty 1

## 2012-02-14 MED ORDER — ALBUTEROL SULFATE (5 MG/ML) 0.5% IN NEBU: 2.5000 mg | INHALATION_SOLUTION | RESPIRATORY_TRACT | Status: DC | PRN

## 2012-02-14 MED ORDER — IPRATROPIUM BROMIDE 0.02 % IN SOLN
0.5000 mg | Freq: Three times a day (TID) | RESPIRATORY_TRACT | Status: DC
  Administered 2012-02-15: 0.5 mg via RESPIRATORY_TRACT
  Filled 2012-02-14: qty 2.5

## 2012-02-14 MED ORDER — AZITHROMYCIN 250 MG PO TABS
250.0000 mg | ORAL_TABLET | Freq: Every day | ORAL | Status: DC
  Filled 2012-02-14: qty 1

## 2012-02-14 MED ORDER — ALBUTEROL SULFATE (5 MG/ML) 0.5% IN NEBU
2.5000 mg | INHALATION_SOLUTION | Freq: Three times a day (TID) | RESPIRATORY_TRACT | Status: DC
  Administered 2012-02-15: 2.5 mg via RESPIRATORY_TRACT
  Filled 2012-02-14: qty 0.5

## 2012-02-14 MED ORDER — TECHNETIUM TC 99M SULFUR COLLOID
2.0000 | Freq: Once | INTRAVENOUS | Status: AC | PRN
  Administered 2012-02-14: 2 via INTRAVENOUS

## 2012-02-14 MED ORDER — ZOLPIDEM TARTRATE 5 MG PO TABS
10.0000 mg | ORAL_TABLET | Freq: Every evening | ORAL | Status: DC | PRN
  Administered 2012-02-14: 10 mg via ORAL
  Filled 2012-02-14: qty 2

## 2012-02-14 MED ORDER — POLYETHYLENE GLYCOL 3350 17 G PO PACK
17.0000 g | PACK | Freq: Every day | ORAL | Status: DC
  Filled 2012-02-14 (×2): qty 1

## 2012-02-14 MED ORDER — SODIUM CHLORIDE 0.9 % IJ SOLN
10.0000 mL | Freq: Two times a day (BID) | INTRAMUSCULAR | Status: DC
  Administered 2012-02-14: 10 mL via INTRAVENOUS

## 2012-02-14 NOTE — Progress Notes (Signed)
Subjective: Abdominal pain decreasing. Diarrhea has improved; placed on metronidazole.  Objective: Vital signs in last 24 hours: Temp:  [97.4 F (36.3 C)-97.7 F (36.5 C)] 97.6 F (36.4 C) (05/22 0600) Pulse Rate:  [59-67] 59  (05/22 0600) Resp:  [10-38] 18  (05/22 0600) BP: (103-167)/(53-103) 112/53 mmHg (05/22 0600) SpO2:  [93 %-99 %] 95 % (05/22 0600) Weight change:  Last BM Date: 02/13/12  PE: GEN:  NAD ABD:  Soft  Lab Results: Stool Cdiff PCR positive C1 esterase inhibitor level normal  Assessment:  1.  Nausea, vomiting. Improving. 2.  C. Difficile PCR positive diarrhea, improving. 3.  Abnormal CT abd/pelvis (gastric distention; EGD without evidence of gastric outlet obstruction). 4.  Abdominal pain.  Improving.  Plan:  1.  Gastric emptying study today. 2.  Awaiting porphyria urine studies. 3.  Continue treatment for C. Difficile colitis. 4.  Hopefully home in a day or two. 5.  Will follow.   Freddy Jaksch 02/14/2012, 8:33 AM

## 2012-02-14 NOTE — Progress Notes (Signed)
Subjective: C/o cough , productive of yellowish phlegm. Has "swollen hands and feet " last night.  Objective: Vital signs in last 24 hours: Temp:  [97.4 F (36.3 C)-97.8 F (36.6 C)] 97.8 F (36.6 C) (05/22 1412) Pulse Rate:  [59-60] 60  (05/22 1412) Resp:  [18-20] 20  (05/22 1412) BP: (112-147)/(53-94) 147/94 mmHg (05/22 1412) SpO2:  [95 %-98 %] 98 % (05/22 1412) Weight change:  Last BM Date: 02/13/12  Intake/Output from previous day: 05/21 0701 - 05/22 0700 In: 1885.8 [P.O.:600; I.V.:1285.8] Out: 400 [Urine:400]     Physical Exam: General: Comfortable, alert, communicative, fully oriented, not short of breath at rest, quite garrulous.  HEENT:  No clinical pallor, no jaundice, no conjunctival injection or discharge. Hydration appears fair.  NECK:  Supple, JVP not seen, no carotid bruits, no palpable lymphadenopathy, no palpable goiter. CHEST:  Clinically clear to auscultation, no wheezes, no crackles. HEART:  Sounds 1 and 2 heard, normal, regular, no murmurs. ABDOMEN:  Moderately obese, soft, non-tender, no palpable organomegaly, no palpable masses, normal bowel sounds. GENITALIA:  Not examined. LOWER EXTREMITIES:  No pitting edema, palpable peripheral pulses. MUSCULOSKELETAL SYSTEM:  Unremarkable. CENTRAL NERVOUS SYSTEM:  No focal neurologic deficit on gross examination.  Lab Results:  Basename 02/12/12 0628 02/11/12 1818  WBC 8.8 14.8*  HGB 13.1 15.0  HCT 38.0 43.1  PLT 206 254    Basename 02/12/12 0628 02/11/12 1818  NA 131* 139  K 4.3 3.7  CL 101 102  CO2 21 22  GLUCOSE 182* 134*  BUN 12 12  CREATININE 0.56 0.78  CALCIUM 8.2* 9.6   Recent Results (from the past 240 hour(s))  STOOL CULTURE     Status: Normal (Preliminary result)   Collection Time   02/13/12 11:32 AM      Component Value Range Status Comment   Specimen Description PERIRECTAL   Final    Special Requests NONE   Final    Culture Culture reincubated for better growth   Final    Report  Status PENDING   Incomplete   CLOSTRIDIUM DIFFICILE BY PCR     Status: Abnormal   Collection Time   02/13/12 11:32 AM      Component Value Range Status Comment   C difficile by pcr POSITIVE (*) NEGATIVE  Final      Studies/Results: Nm Gastric Emptying  02/14/2012  *RADIOLOGY REPORT*  Clinical Data:  Nausea and vomiting.  NUCLEAR MEDICINE GASTRIC EMPTYING SCAN  Technique:  After oral ingestion of radiolabeled meal, sequential abdominal images were obtained for 120 minutes.  Residual percentage of activity remaining within the stomach was calculated at 60 and 120 minutes.  Radiopharmaceutical:  2.0 mCi Tc-91m sulfur colloid.  Comparison:  None  Findings: At 60 minutes 16% of the activity remained in the stomach.  At 120 minutes 2% of the activity remained in the stomach.  Normal is less than 30% at 120 minutes.  IMPRESSION: Normal solid phase gastric emptying study.  Original Report Authenticated By: P. Loralie Champagne, M.D.    Medications: Scheduled Meds:   . albuterol  2.5 mg Nebulization Q6H  . budesonide  0.5 mg Nebulization BID  . cycloSPORINE  1 drop Both Eyes Daily  . insulin aspart  0-9 Units Subcutaneous TID WC  . ipratropium  0.5 mg Nebulization Q6H  . loratadine  10 mg Oral Daily  . metroNIDAZOLE  500 mg Oral Q8H  . pantoprazole  40 mg Oral Daily  . sucralfate  1 g Oral BID WC  .  triamcinolone cream   Topical TID  . DISCONTD: metoCLOPramide  5-10 mg Oral TID AC & HS   Continuous Infusions:   . sodium chloride 125 mL/hr at 02/12/12 2328   PRN Meds:.acetaminophen, acetaminophen, albuterol, clonazePAM, dicyclomine, HYDROcodone-acetaminophen, hydrOXYzine, hyoscyamine, morphine injection, ondansetron (ZOFRAN) IV, ondansetron, technetium sulfur colloid, tiZANidine, DISCONTD: hydrOXYzine  Assessment/Plan:  1. Chronic abdominal pain:  Patient has a known history of history of longstanding intermittent abdominal pain. For the past one year or so, she has had intermittent and  discrete episodes of LUQ pain, nausea, bloating, diarrhea, has been seen by Drs. Juanda Chance, and Outlaw a few times over the past several months and had endoscopy by Dr. Juanda Chance, which showed bile gastritis, but was otherwise normal. CT of abdomen/pelvis, of 02/11/12, showed showed gastric distention without obvious obstruction or inflammatory process. 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 Result is pending. EGD on 02/13/12, showed mild distal esophagitis. Mild gastritis, biopsied. Post-Nissen fundoplication, seemingly intact. Otherwise normal stomach, pylorus, and duodenum to the second portion. No evidence of gastric outlet obstruction. Patient has been reassured accordingly. She is currently on PPI therapy ans Sucralfate, Gastric emptying study of 02/14/12, is normal. Patient is tolerating diet. 2. Intermittent Diarrhea: Patient has known history if IBS. Stool sample was sent off for C diff PCR and was positive. She was started on oral Flagyl on 02/13/12. A 14-day course of treatment is planned.  3. Diabetes mellitus:  HGBA1C is 6.4. CBGs are normal on diet/SSI. 4. Asthma Exacerbation:  Patient presented with mild asthma symptoms, although this is clinically not consistent with an acute flare, and as such, did not require steroid treatment. As of 02/14/12, she appeared clinically stable, from this view point. As she has a productive cough with yellow phlegm today, perhaps she does have a super-added mild acute bronchitis. Have added Mucinex and Azithromycin to treatment.   Comment: Aiming possible discharge on 02/15/12. Have discontinued iv fluids.    LOS: 3 days   Richardson Dubree,CHRISTOPHER 02/14/2012, 4:18 PM

## 2012-02-14 NOTE — Progress Notes (Signed)
Pt requesting for her prn Hydroxyzine to be changed to 100 mg qHS prn.  Also, per pt, she takes 20 mg of Lasix prn at home.  Pt experiencing swelling in lt hand and bilateral lower extremities.  Notified MD on call, Donnamarie Poag, who changed the Hydroxyzine but did not order the Lasix.  Discussed with pt and left note for dayshift MD to address.

## 2012-02-15 DIAGNOSIS — K589 Irritable bowel syndrome without diarrhea: Secondary | ICD-10-CM

## 2012-02-15 DIAGNOSIS — E1165 Type 2 diabetes mellitus with hyperglycemia: Secondary | ICD-10-CM

## 2012-02-15 DIAGNOSIS — J45901 Unspecified asthma with (acute) exacerbation: Secondary | ICD-10-CM

## 2012-02-15 DIAGNOSIS — A0472 Enterocolitis due to Clostridium difficile, not specified as recurrent: Secondary | ICD-10-CM

## 2012-02-15 LAB — CBC
HCT: 35.3 % — ABNORMAL LOW (ref 36.0–46.0)
Hemoglobin: 12.2 g/dL (ref 12.0–15.0)
MCH: 29.9 pg (ref 26.0–34.0)
MCHC: 34.6 g/dL (ref 30.0–36.0)
MCV: 86.5 fL (ref 78.0–100.0)
RDW: 13 % (ref 11.5–15.5)

## 2012-02-15 LAB — BASIC METABOLIC PANEL
BUN: 7 mg/dL (ref 6–23)
Calcium: 9 mg/dL (ref 8.4–10.5)
Creatinine, Ser: 0.7 mg/dL (ref 0.50–1.10)
GFR calc Af Amer: >90 mL/min (ref >90)
GFR calc non Af Amer: >90 mL/min (ref >90)
Glucose, Bld: 113 mg/dL — ABNORMAL HIGH (ref 70–99)

## 2012-02-15 LAB — GLUCOSE, CAPILLARY: Glucose-Capillary: 109 mg/dL — ABNORMAL HIGH (ref 70–99)

## 2012-02-15 MED ORDER — GUAIFENESIN ER 600 MG PO TB12: 600.0000 mg | ORAL_TABLET | Freq: Two times a day (BID) | ORAL | Status: DC

## 2012-02-15 MED ORDER — SACCHAROMYCES BOULARDII 250 MG PO CAPS: 250.0000 mg | ORAL_CAPSULE | Freq: Two times a day (BID) | ORAL | Status: AC

## 2012-02-15 MED ORDER — METRONIDAZOLE 500 MG PO TABS: 500.0000 mg | ORAL_TABLET | Freq: Three times a day (TID) | ORAL | Status: AC

## 2012-02-15 MED ORDER — AZITHROMYCIN 250 MG PO TABS: ORAL_TABLET | ORAL | Status: DC

## 2012-02-15 NOTE — Progress Notes (Signed)
Patient discharged to home in care of spouse. Medications and instructions reviewed with patient and spouse with no questions. Assessment unchanged from this am. Patient is to follow up with Dr. Dulce Sellar on 6.3.13 and with PCP for hospital follow up.

## 2012-02-15 NOTE — Progress Notes (Signed)
Pt's IV bleeding when RN entered the room.  Removed NSL and will leave note for MD in am to make sure IV does not need to be restarted.

## 2012-02-15 NOTE — Progress Notes (Signed)
Subjective: Some nausea with solid meal last night. Overall, however, pain and nausea + vomiting symptoms much improved. Diarrhea resolved; is now having constipation. Wants to go home.  Objective: Vital signs in last 24 hours: Temp:  [97.6 F (36.4 C)-97.8 F (36.6 C)] 97.6 F (36.4 C) (05/23 0549) Pulse Rate:  [60-78] 78  (05/23 0549) Resp:  [20-24] 20  (05/23 0549) BP: (134-158)/(71-94) 158/89 mmHg (05/23 0549) SpO2:  [98 %-99 %] 99 % (05/23 0700) Weight change:  Last BM Date: 02/13/12  PE: GEN:  NAD ABD:  Protuberant, soft  Lab/Studies Results:  --C. Diff stool PCR positive --Gastric emptying study = normal gastric emptying  Assessment:  1.  Intermittent nausea, vomiting and pain.  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  Suspect C. Diff infection might have exacerbated these symptoms. 2.  Diarrhea with C. Difficile PCR positivity.  Improving on metronidazole.  Plan:  1.  14-day course of metronidazole. 2.  Advance diet. 3.  Awaiting urine porphyrin studies. 4.  Ok to discharge home from GI perspective.  We will follow-up with her in a couple weeks. 5.  Thank you for the consultation.  Will sign-off.  Please call with any questions.   Freddy Jaksch 02/15/2012, 9:17 AM

## 2012-02-15 NOTE — Discharge Summary (Addendum)
Physician Discharge Summary  Patient ID: [name] MRN: [id number] DOB/AGE: 05-29-1956 56 y.o.  Admit date: 02/11/2012 Discharge date: 02/15/2012  Primary Care Physician:  Sonda Primes, MD, MD Primary Gastroenterologist: Dr Willis Modena.  Discharge Diagnoses:    Patient Active Problem List  Diagnoses  . DIABETES MELLITUS, TYPE II  . PURE HYPERCHOLESTEROLEMIA  . OBESITY, MORBID  . GRIEF REACTION  . DEPRESSION  . PERIPHERAL NEUROPATHY  . SINUSITIS- ACUTE-NOS  . BRONCHITIS  . Unspecified Asthma  . COPD  . GERD  . Gastroparesis  . Irritable bowel syndrome  . PYELONEPHRITIS  . BREAST PAIN, LEFT  . MENOPAUSAL SYNDROME  . ALLERGIC URTICARIA  . SHOULDER PAIN  . FIBROMYALGIA  . OBSTRUCTIVE SLEEP APNEA  . FATIGUE  . SWEATING  . PARESTHESIA  . EDEMA  . Cough  . CHEST PAIN, UNSPECIFIED  . NAUSEA  . DYSPHAGIA UNSPECIFIED  . Diarrhea  . Abdominal Pain, Left Upper Quadrant  . ABDOMINAL PAIN-EPIGASTRIC  . MIGRAINE HEADACHE  . Radiologic findings of lung field, abnormal  . Abdominal pain    Medication List  As of 02/15/2012  9:46 AM   STOP taking these medications         ALIGN 4 MG Caps         TAKE these medications         albuterol 108 (90 BASE) MCG/ACT inhaler   Commonly known as: PROVENTIL HFA;VENTOLIN HFA   Inhale 2 puffs into the lungs every 4 (four) hours as needed.      azithromycin 250 MG tablet   Commonly known as: ZITHROMAX   Take 1 tablet (250 mg total) by mouth daily. MAY ADMINISTER WITHOUT REGARD TO MEALS      BOTOX 200 UNITS Solr   Generic drug: Botulinum Toxin Type A   Every 3 months for headaches, given by Neurologist, Dr. Neale Burly      clonazePAM 1 MG tablet   Commonly known as: KLONOPIN   Take 1 tablet (1 mg total) by mouth 3 (three) times daily as needed.      Colesevelam HCl 3.75 G Pack   Take 1 each by mouth 1 day or 1 dose.      conjugated estrogens vaginal cream   Commonly known as: PREMARIN   Place vaginally daily. Use pv  2-3/wk      cycloSPORINE 0.05 % ophthalmic emulsion   Commonly known as: RESTASIS   Place 1 drop into both eyes daily.      dicyclomine 20 MG tablet   Commonly known as: BENTYL   Take 1 tablet (20 mg total) by mouth 3 (three) times daily as needed.      esomeprazole 20 MG capsule   Commonly known as: NEXIUM   Take 1 capsule (20 mg total) by mouth daily before breakfast.      furosemide 20 MG tablet   Commonly known as: LASIX   Take 20 mg by mouth daily. As needed      glucose blood test strip   Use as instructed      guaiFENesin 600 MG 12 hr tablet   Commonly known as: MUCINEX   Take 1 tablet (600 mg total) by mouth 2 (two) times daily.      Hydrocodone-Acetaminophen 10-660 MG Tabs   Take 1 tablet by mouth 4 (four) times daily as needed (for pain).      hydrOXYzine 50 MG capsule   Commonly known as: VISTARIL   Take 1 capsule (50 mg total) by  mouth once. 2 tab by mouth as needed for insomnia      hyoscyamine 0.125 MG SL tablet   Commonly known as: LEVSIN SL   Place 0.125 mg under the tongue every 4 (four) hours as needed.      ipratropium-albuterol 0.5-2.5 (3) MG/3ML Soln   Commonly known as: DUONEB   USE 1 VIAL IN NEBULIZER 4 TIMES DAILY AS NEEDED      metFORMIN 500 MG tablet   Commonly known as: GLUCOPHAGE   Take 1 tablet (500 mg total) by mouth 2 (two) times daily with a meal.      metoCLOPramide 10 MG tablet   Commonly known as: REGLAN   Take 0.5-1 tablets (5-10 mg total) by mouth 4 (four) times daily. Use qid ac and at hs      metroNIDAZOLE 0.75 % vaginal gel   Commonly known as: METROGEL   Place 1 Applicatorful vaginally 2 (two) times daily.      metroNIDAZOLE 500 MG tablet   Commonly known as: FLAGYL   Take 1 tablet (500 mg total) by mouth every 8 (eight) hours.      PATADAY 0.2 % Soln   Generic drug: Olopatadine HCl   Use everyday      PREMARIN 0.9 MG tablet   Generic drug: estrogens (conjugated)   TAKE 1 TABLET BY MOUTH EVERY DAY       promethazine 25 MG tablet   Commonly known as: PHENERGAN   Take 1-2 tablets every 6 hours prn      saccharomyces boulardii 250 MG capsule   Commonly known as: FLORASTOR   Take 1 capsule (250 mg total) by mouth 2 (two) times daily.      sucralfate 1 G tablet   Commonly known as: CARAFATE   Take 1 g by mouth 2 (two) times daily.      tiZANidine 4 MG tablet   Commonly known as: ZANAFLEX   Take 1 tablet (4 mg total) by mouth every 8 (eight) hours as needed.      triamcinolone cream 0.1 %   Commonly known as: KENALOG   Apply topically 3 (three) times daily. As needed for rash      ZYRTEC HIVES RELIEF 10 MG tablet   Generic drug: cetirizine   Take 10 mg by mouth. As needed             Disposition and Follow-up:  Follow up with primary MD and with primary gastroenterologist.  Consults:  GI  Dr Abbe Amsterdam.  Significant Diagnostic Studies:  Ct Abdomen Pelvis Wo Contrast  02/11/2012  *RADIOLOGY REPORT*  Clinical Data: Upper abdominal pain with nausea and vomiting.  CT ABDOMEN AND PELVIS WITHOUT CONTRAST  Technique:  Multidetector CT imaging of the abdomen and pelvis was performed following the standard protocol without intravenous contrast.  Comparison: CT scan dated 04/26/2011  Findings: There is gaseous distention of the stomach.  The duodenum is not distended. Large and small bowel appear normal.  Liver, spleen, pancreas, adrenal glands, and kidneys are normal. Gallbladder has been removed.  Uterus and ovaries have been removed.  Appendix is been removed.  No acute osseous abnormality. 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  IMPRESSION: Gaseous distention of the stomach without evidence of duodenal obstruction.  Otherwise benign-appearing abdomen and pelvis.  Original Report Authenticated By: Gwynn Burly, M.D.   Dg Chest Port 1 View  02/12/2012  *RADIOLOGY REPORT*  Clinical Data: Shortness of breath.  Abdominal pain.  PORTABLE CHEST -  1 VIEW  Comparison:  07/19/2011  Findings: Both lungs are clear.  Heart size is stable.  No evidence of pleural effusion.  IMPRESSION: No acute findings.  Original Report Authenticated By: Danae Orleans, M.D.    Brief H and P: For complete details, refer to admission H and P. However, in brief, 56 year-old female with history of IBS, asthma, COPD, GERD, s/p Nissen fundoplication, Migraines, Fibromyalgia, DM-2, peripheral neuropathy, Obesity, OSA, presenting with sudden worsening of chronic abdominal pain. She was found to have shortness of breath with wheezing. CT abdomen pelvis showed gastric distention without any outlet obstruction. Lactic acid was found to be high, with leukocytosis. Patient was admitted for further evaluation, investigation and management.  Physical Exam: On 02/15/12. General: Comfortable, alert, communicative, fully oriented, not short of breath at rest, quite garrulous.  HEENT: No clinical pallor, no jaundice, no conjunctival injection or discharge. Hydration appears fair.  NECK: Supple, JVP not seen, no carotid bruits, no palpable lymphadenopathy, no palpable goiter.  CHEST: Clinically clear to auscultation, no wheezes, no crackles.  HEART: Sounds 1 and 2 heard, normal, regular, no murmurs.  ABDOMEN: Moderately obese, soft, non-tender, no palpable organomegaly, no palpable masses, normal bowel sounds.  GENITALIA: Not examined.  LOWER EXTREMITIES: No pitting edema, palpable peripheral pulses.  MUSCULOSKELETAL SYSTEM: Unremarkable.  CENTRAL NERVOUS SYSTEM: No focal neurologic deficit on gross examination.  Hospital Course:  1. Chronic abdominal pain: Patient has a known history of history of longstanding intermittent abdominal pain. For the past one year or so, she has had intermittent and discrete episodes of LUQ pain, nausea, bloating, diarrhea, has been seen by Drs. Juanda Chance, and Outlaw a few times over the past several months and had endoscopy by Dr. Juanda Chance, which showed bile gastritis, but was  otherwise normal. CT of abdomen/pelvis, of 02/11/12, showed showed gastric distention without obvious obstruction or inflammatory process. Dr Chrissie Noa outlaw provided GI consultation, and recommended evaluation for acute intermittent porphyria, with C1 esterase inhibitor levels and urine porphyrin screen. At the time of discharge, results of these studies were still pending. EGD on 02/13/12, showed mild distal esophagitis. Mild gastritis, biopsied. Post-Nissen fundoplication, seemingly intact. Otherwise normal stomach, pylorus, and duodenum to the second portion. No evidence of gastric outlet obstruction. Patient has been reassured accordingly. She was managed with PPI therapy and Sucralfate, and symptoms have greatly ameliorated. Gastric emptying study of 02/14/12, was normal, with only 2% residual, at 120 hours. Patient tolerated regular diet as of 02/14/12.  2. Intermittent Diarrhea/C. Difficile colitis: Patient has known history if IBS. Stool sample was sent off for C diff PCR and was positive. She was started on oral Flagyl on 02/13/12. A 14-day course of treatment is planned, to be concluded on 02/27/12. She has been placed on Florastor. 3. Diabetes mellitus:  HGBA1C is 6.4. CBGs remained normal on diet/SSI.  4. Asthma Exacerbation:  Patient presented with mild asthma symptoms, although this is clinically not consistent with an acute flare, and as such, did not require steroid treatment. As of 02/14/12, she appeared clinically stable, from this view point. As she had a productive cough with yellow phlegm on 02/14/12, perhaps she does have a super-added mild acute bronchitis. Mucinex and Azithromycin were added to treatment.   Comment: Clinically stable for discharge on 02/15/12.    Time spent on Discharge: 45 mins.  Signed: Garreth Burnsworth,CHRISTOPHER 02/15/2012, 9:46 AM

## 2012-02-17 LAB — STOOL CULTURE

## 2012-02-20 ENCOUNTER — Telehealth: Payer: Self-pay | Admitting: Endocrinology

## 2012-02-20 ENCOUNTER — Emergency Department (INDEPENDENT_AMBULATORY_CARE_PROVIDER_SITE_OTHER)
Admission: EM | Admit: 2012-02-20 | Discharge: 2012-02-20 | Disposition: A | Discharge status: Home / Self Care | Payer: 59 | Source: Home / Self Care | Attending: Emergency Medicine | Admitting: Emergency Medicine

## 2012-02-20 ENCOUNTER — Encounter (HOSPITAL_COMMUNITY): Payer: Self-pay | Admitting: *Deleted

## 2012-02-20 DIAGNOSIS — J45909 Unspecified asthma, uncomplicated: Secondary | ICD-10-CM

## 2012-02-20 MED ORDER — LEVOFLOXACIN 500 MG PO TABS: 500.0000 mg | ORAL_TABLET | Freq: Every day | ORAL | Status: DC

## 2012-02-20 MED ORDER — DEXAMETHASONE 4 MG PO TABS: ORAL_TABLET | ORAL | Status: DC

## 2012-02-20 NOTE — ED Provider Notes (Signed)
History     CSN: [id number]  Arrival date & time 02/20/12  1136   First MD Initiated Contact with Patient 02/20/12 1225      Chief Complaint  Patient presents with  . Cough    (Consider location/radiation/quality/duration/timing/severity/associated sxs/prior treatment) HPI Comments: Patient presents today with persistent coughing, chest tightness, shortness of breath. Patient presented to the [facility] on 5/19 with abdominal pain and shortness of breath. She was admitted, and had multiple studies, including an EGD, which showed mild distal esophagitis and gastritis. She was found to have C. difficile. She was started on a 2 week worse of Flagyl which she states she is taking. She was also thought to have a mild acute bronchitis. Chest x-ray done on 5/20 was negative for pneumonia, effusion. She was also started on Mucinex and azithromycin. She was discharged on 5/23. She states that she finished all the azithromycin, but is not improving. States that she is not taking Mucinex. She is using her DuoNeb 3-4 times a day, which is new for her. She reports wheezing, difficulty sleeping because of the coughing, chest tightness, shortness of breath that is consistent with previous episodes of asthmatic bronchitis.She reports sore, scratchy throat, especially when lying down flat at night. No fevers. No voice changes. No drooling, difficulty swallowing. Symptoms are better with bronchodilators. There are worse with lying down and exertion.  ROS as noted in HPI. All other ROS negative.   Patient is a 56 y.o. female presenting with cough. The history is provided by the patient. No language interpreter was used.  Cough This is a recurrent problem. The current episode started yesterday. The problem occurs constantly. The problem has been gradually worsening. The cough is non-productive. There has been no fever. Associated symptoms include sore throat, shortness of breath and wheezing. Pertinent negatives  include no chills and no rhinorrhea. Treatments tried: Beta agonists. The treatment provided mild relief. Smoker: Former smoker. Her past medical history is significant for bronchitis and asthma.    Past Medical History  Diagnosis Date  . Asthma   . COPD (chronic obstructive pulmonary disease)   . Depression   . GERD (gastroesophageal reflux disease)     Dr Juanda Chance  . Gastroparesis   . Migraines   . Menopause   . IBS (irritable bowel syndrome)   . Diabetes mellitus type II   . Peripheral neuropathy   . Fibromyalgia   . Obesity   . Barrett's esophagus   . MRSA (methicillin resistant Staphylococcus aureus)     possible (by verbal report from pt)  . History of hiatal hernia   . OSA (obstructive sleep apnea)     pt denies 02/13/12, 02/20/12  . PNA (pneumonia)     Past Surgical History  Procedure Date  . Abdominal hysterectomy     Complete  . Sinus surgery with instatrak     x3  . Surgery left heel spur   . Appendectomy   . Ventral hernia repair     with mesh  . Cholecystectomy   . Nissen fundoplication 1999  . Mouth surgery   . Esophagogastroduodenoscopy 02/13/2012    Procedure: ESOPHAGOGASTRODUODENOSCOPY (EGD);  Surgeon: Willis Modena, MD;  Location: [facility] ENDOSCOPY;  Service: Endoscopy;  Laterality: N/A;    Family History  Problem Relation Age of Onset  . Diabetes Mother   . Lung cancer Mother   . Cancer Mother     lung  . Esophageal cancer Father   . Heart disease Father   .  Cancer Father     esophageal  . Diabetes Father   . Colon polyps Father     Family History  . Breast cancer Maternal Grandmother   . Heart disease Maternal Grandmother   . Diabetes Maternal Grandmother   . Cancer Maternal Grandmother     breast, colon  . Heart attack Brother 55  . Colon cancer Maternal Grandfather   . Colon polyps Maternal Grandfather   . Diabetes Maternal Grandfather   . Colon polyps Paternal Grandfather   . Colon cancer Paternal Grandfather   . Breast cancer Maternal  Aunt   . Arthritis    . Hypertension    . Uterine cancer Other     great grandmother    History  Substance Use Topics  . Smoking status: Former Smoker -- 1.0 packs/day for 20 years    Quit date: 12/24/2009  . Smokeless tobacco: Not on file   Comment: Stopped April 2011  . Alcohol Use: No     occasional (1-2 times per month)    OB History    Grav Para Term Preterm Abortions TAB SAB Ect Mult Living                  Review of Systems  Constitutional: Negative for chills.  HENT: Positive for sore throat. Negative for rhinorrhea.   Respiratory: Positive for cough, shortness of breath and wheezing.     Allergies  Droperidol; Amoxicillin-pot clavulanate; Ciprofloxacin; Duloxetine; Hydromorphone hcl; Iohexol; Ketorolac tromethamine; Moxifloxacin; Penicillins; Pioglitazone; Pregabalin; Pristiq; Quetiapine; and Topiramate  Home Medications   Current Outpatient Rx  Name Route Sig Dispense Refill  . ALBUTEROL SULFATE HFA 108 (90 BASE) MCG/ACT IN AERS Inhalation Inhale 2 puffs into the lungs every 4 (four) hours as needed. 3.7 g 3  . CETIRIZINE HCL 10 MG PO TABS Oral Take 10 mg by mouth. As needed    . CLONAZEPAM 1 MG PO TABS Oral Take 1 tablet (1 mg total) by mouth 3 (three) times daily as needed. 90 tablet 3  . ESTROGENS, CONJUGATED 0.625 MG/GM VA CREA Vaginal Place vaginally daily. Use pv 2-3/wk 42.5 g 12  . CYCLOSPORINE 0.05 % OP EMUL Both Eyes Place 1 drop into both eyes daily.      Marland Kitchen DICYCLOMINE HCL 20 MG PO TABS Oral Take 1 tablet (20 mg total) by mouth 3 (three) times daily as needed. 90 tablet 3  . ESOMEPRAZOLE MAGNESIUM 20 MG PO CPDR Oral Take 1 capsule (20 mg total) by mouth daily before breakfast. 90 capsule 3  . FUROSEMIDE 20 MG PO TABS Oral Take 20 mg by mouth daily. As needed     . GLUCOSE BLOOD VI STRP  Use as instructed 100 each 3  . HYDROCODONE-ACETAMINOPHEN 10-660 MG PO TABS Oral Take 1 tablet by mouth 4 (four) times daily as needed (for pain). 120 each 2  .  HYDROXYZINE PAMOATE 50 MG PO CAPS Oral Take 1 capsule (50 mg total) by mouth once. 2 tab by mouth as needed for insomnia 90 capsule 3  . HYOSCYAMINE SULFATE 0.125 MG SL SUBL Sublingual Place 0.125 mg under the tongue every 4 (four) hours as needed.      . IPRATROPIUM-ALBUTEROL 0.5-2.5 (3) MG/3ML IN SOLN  USE 1 VIAL IN NEBULIZER 4 TIMES DAILY AS NEEDED 180 mL 3  . METFORMIN HCL 500 MG PO TABS Oral Take 1 tablet (500 mg total) by mouth 2 (two) times daily with a meal. 180 tablet 3  . METOCLOPRAMIDE HCL 10 MG  PO TABS Oral Take 0.5-1 tablets (5-10 mg total) by mouth 4 (four) times daily. Use qid ac and at hs 120 tablet 3  . METRONIDAZOLE 500 MG PO TABS Oral Take 1 tablet (500 mg total) by mouth every 8 (eight) hours. 36 tablet 0  . METRONIDAZOLE 0.75 % VA GEL Vaginal Place 1 Applicatorful vaginally 2 (two) times daily. 70 g 1  . SACCHAROMYCES BOULARDII 250 MG PO CAPS Oral Take 1 capsule (250 mg total) by mouth 2 (two) times daily. 60 capsule 0  . SUCRALFATE 1 G PO TABS Oral Take 1 g by mouth 2 (two) times daily.      Marland Kitchen TIZANIDINE HCL 4 MG PO TABS Oral Take 1 tablet (4 mg total) by mouth every 8 (eight) hours as needed. 90 tablet 1  . TRIAMCINOLONE ACETONIDE 0.1 % EX CREA Topical Apply topically 3 (three) times daily. As needed for rash 45 g 1  . ONABOTULINUMTOXINA 200 UNITS IJ SOLR  Every 3 months for headaches, given by Neurologist, Dr. Neale Burly     . COLESEVELAM HCL 3.75 G PO PACK Oral Take 1 each by mouth 1 day or 1 dose. 30 each 11  . DEXAMETHASONE 4 MG PO TABS  4 tablets (16 mg) po at once on day 1 and 4 tabs (16 mg) po at once on day 2 8 tablet 0  . LEVOFLOXACIN 500 MG PO TABS Oral Take 1 tablet (500 mg total) by mouth daily. X 7 days 7 tablet 0  . PATADAY 0.2 % OP SOLN  Use everyday    . PREMARIN 0.9 MG PO TABS  TAKE 1 TABLET BY MOUTH EVERY DAY 30 tablet 5  . PROMETHAZINE HCL 25 MG PO TABS  Take 1-2 tablets every 6 hours prn 90 tablet 0    BP 136/74  Pulse 74  Temp(Src) 97.9 F (36.6 C)  (Oral)  Resp 18  SpO2 96%  Physical Exam  Nursing note and vitals reviewed. Constitutional: She is oriented to person, place, and time. She appears well-developed and well-nourished. No distress.       Speaking in full sentences  HENT:  Head: Normocephalic and atraumatic.  Eyes: Conjunctivae and EOM are normal.  Neck: Normal range of motion.  Cardiovascular: Normal rate, regular rhythm and normal heart sounds.   Pulmonary/Chest: Effort normal. No respiratory distress. She has wheezes. She has no rales. She exhibits no tenderness.       Poor air movement. Prolonged expiratory phase with wheezing.  Abdominal: She exhibits no distension.  Musculoskeletal: Normal range of motion. She exhibits no edema.  Neurological: She is alert and oriented to person, place, and time.  Skin: Skin is warm and dry.  Psychiatric: She has a normal mood and affect. Her behavior is normal. Judgment and thought content normal.    ED Course  Procedures (including critical care time)  Labs Reviewed - No data to display No results found.   1. Asthmatic bronchitis      MDM  Previous records reviewed. As noted in history of present illness.   Peak flow here 320/280/270. Calculated peak flow 456.   H&P most consistent with an asthmatic bronchitis.. Patient does not know what her best peak flow is at home. Will send her home on today's dexamethasone, and Levaquin as she is an ex-smoker, and did not improve with the azithromycin. She knows that her sugars will be  difficult to control when taking the dexamethasone. She will adjust her medications accordingly. We had an extensive  discussion about doing albuterol tx every 4-6 hours, and keeping a record of her peak flows. discussed signs and symptoms that should prompt return to the ER. She has an appointment with her primary care physician on Friday. Patient & husband agree with plan.  Luiz Blare, MD 02/20/12 [phone]

## 2012-02-20 NOTE — ED Notes (Signed)
Pt was admitted  To  [facility]  about 9 days ago for SOB, ABD pain and nausea/diarrhea.   She was discharged 02/16/2012.  And was Dx'd with C diff.   She has been taking a Zpack and continues to have a productive cough cough, swelling in her throat, nausea.  The diarrhea is much better.   She denies fever at home.

## 2012-02-20 NOTE — Discharge Instructions (Signed)
Take two puffs from your albuterol inhaler every 4-6 hours. Alternate this with the duoneb. Do not use the duoneb more than 4 times a day.  Finish the steroids unless your doctor tells you to stop. Finish the antibiotics, even if you feel better. Do a peak flow, once the morning and once at night. Write this down. The number should be going up, not down. You may decrease the frequency of your albuterol inhaler as the numbers go up and you start feeling better. You may start the steroids tomorrow, as you have already taken today's dose. Make sure you drink extra fluids. Return if you get worse, have a fever >100.4, or any other concerns.   Go to www.goodrx.com to look up your medications. This will give you a list of where you can find your prescriptions at the most affordable prices.

## 2012-02-20 NOTE — Telephone Encounter (Signed)
FYI: Pt called stating her breathing was worse.  She is scheduled for a post hosp and breathing check on May 31.  Dr. Everardo All wanted her to go to the ER.  She said she will.

## 2012-02-21 ENCOUNTER — Telehealth: Payer: Self-pay

## 2012-02-21 NOTE — Telephone Encounter (Signed)
Please advise if ok to refill clonazepam 1mg  1-3qd for this plot pt. Patient last seen 01/05/12 and medication last filled #[id number]/3rf Thanks

## 2012-02-23 ENCOUNTER — Encounter: Payer: Self-pay | Admitting: Endocrinology

## 2012-02-23 ENCOUNTER — Ambulatory Visit (INDEPENDENT_AMBULATORY_CARE_PROVIDER_SITE_OTHER): Payer: 59 | Admitting: Endocrinology

## 2012-02-23 ENCOUNTER — Telehealth: Payer: Self-pay

## 2012-02-23 VITALS — BP 122/72 | HR 86 | Temp 97.2°F | Wt 249.0 lb

## 2012-02-23 DIAGNOSIS — R0989 Other specified symptoms and signs involving the circulatory and respiratory systems: Secondary | ICD-10-CM

## 2012-02-23 DIAGNOSIS — R0600 Dyspnea, unspecified: Secondary | ICD-10-CM | POA: Insufficient documentation

## 2012-02-23 DIAGNOSIS — J449 Chronic obstructive pulmonary disease, unspecified: Secondary | ICD-10-CM

## 2012-02-23 MED ORDER — HYDROCODONE-ACETAMINOPHEN 10-660 MG PO TABS: 1.0000 | ORAL_TABLET | Freq: Four times a day (QID) | ORAL | Status: DC | PRN

## 2012-02-23 NOTE — Telephone Encounter (Signed)
Rx faxed to pharmacy, pt informed.  

## 2012-02-23 NOTE — Telephone Encounter (Signed)
rx sent in 

## 2012-02-23 NOTE — Progress Notes (Signed)
Subjective:    Patient ID: [name], female    DOB: 04-20-1956, 56 y.o.   MRN: [id number]  HPI The state of at least three ongoing medical problems is addressed today: Abd pain: pt was hospitalized last week, and was rx'ed with flagyl for c diff.  sxs are better now. Pt was rx'ed with levaquin 3 days ago, for AECB.  Sob is improved but not resolved.   DM:  On metformin, cbg's have been in the 200's x a few days (since on steroids).   Past Medical History  Diagnosis Date  . Asthma   . COPD (chronic obstructive pulmonary disease)   . Depression   . GERD (gastroesophageal reflux disease)     Dr Juanda Chance  . Gastroparesis   . Migraines   . Menopause   . IBS (irritable bowel syndrome)   . Diabetes mellitus type II   . Peripheral neuropathy   . Fibromyalgia   . Obesity   . Barrett's esophagus   . MRSA (methicillin resistant Staphylococcus aureus)     possible (by verbal report from pt)  . History of hiatal hernia   . OSA (obstructive sleep apnea)     pt denies 02/13/12, 02/20/12  . PNA (pneumonia)     Past Surgical History  Procedure Date  . Abdominal hysterectomy     Complete  . Sinus surgery with instatrak     x3  . Surgery left heel spur   . Appendectomy   . Ventral hernia repair     with mesh  . Cholecystectomy   . Nissen fundoplication 1999  . Mouth surgery   . Esophagogastroduodenoscopy 02/13/2012    Procedure: ESOPHAGOGASTRODUODENOSCOPY (EGD);  Surgeon: Willis Modena, MD;  Location: [facility] ENDOSCOPY;  Service: Endoscopy;  Laterality: N/A;    History   Social History  . Marital Status: Married    Spouse Name: N/A    Number of Children: 3  . Years of Education: N/A   Occupational History  . CMA disabled    Social History Main Topics  . Smoking status: Former Smoker -- 1.0 packs/day for 20 years    Quit date: 12/24/2009  . Smokeless tobacco: Not on file   Comment: Stopped April 2011  . Alcohol Use: No     occasional (1-2 times per month)  . Drug Use: No   . Sexually Active: Yes   Other Topics Concern  . Not on file   Social History Narrative  . No narrative on file    Current Outpatient Prescriptions on File Prior to Visit  Medication Sig Dispense Refill  . albuterol (PROAIR HFA) 108 (90 BASE) MCG/ACT inhaler Inhale 2 puffs into the lungs every 4 (four) hours as needed.  3.7 g  3  . Botulinum Toxin Type A (BOTOX) 200 UNITS SOLR Every 3 months for headaches, given by Neurologist, Dr. Neale Burly       . cetirizine (ZYRTEC HIVES RELIEF) 10 MG tablet Take 10 mg by mouth. As needed      . clonazePAM (KLONOPIN) 1 MG tablet Take 1 tablet (1 mg total) by mouth 3 (three) times daily as needed.  90 tablet  3  . Colesevelam HCl (WELCHOL) 3.75 G PACK Take 1 each by mouth 1 day or 1 dose.  30 each  11  . conjugated estrogens (PREMARIN) vaginal cream Place vaginally daily. Use pv 2-3/wk  42.5 g  12  . cycloSPORINE (RESTASIS) 0.05 % ophthalmic emulsion Place 1 drop into both eyes daily.        Marland Kitchen  dicyclomine (BENTYL) 20 MG tablet Take 1 tablet (20 mg total) by mouth 3 (three) times daily as needed.  90 tablet  3  . glucose blood test strip Use as instructed  100 each  3  . Hydrocodone-Acetaminophen 10-660 MG TABS Take 1 tablet by mouth 4 (four) times daily as needed (for pain).  120 each  0  . hydrOXYzine (VISTARIL) 50 MG capsule Take 1 capsule (50 mg total) by mouth once. 2 tab by mouth as needed for insomnia  90 capsule  3  . hyoscyamine (LEVSIN SL) 0.125 MG SL tablet Place 0.125 mg under the tongue every 4 (four) hours as needed.        Marland Kitchen ipratropium-albuterol (DUONEB) 0.5-2.5 (3) MG/3ML SOLN USE 1 VIAL IN NEBULIZER 4 TIMES DAILY AS NEEDED  180 mL  3  . levofloxacin (LEVAQUIN) 500 MG tablet Take 1 tablet (500 mg total) by mouth daily. X 7 days  7 tablet  0  . metFORMIN (GLUCOPHAGE) 500 MG tablet Take 1,000 mg by mouth 2 (two) times daily with a meal.      . metoCLOPramide (REGLAN) 10 MG tablet Take 0.5-1 tablets (5-10 mg total) by mouth 4 (four) times  daily. Use qid ac and at hs  120 tablet  3  . metroNIDAZOLE (FLAGYL) 500 MG tablet Take 1 tablet (500 mg total) by mouth every 8 (eight) hours.  36 tablet  0  . metroNIDAZOLE (METROGEL) 0.75 % vaginal gel Place 1 Applicatorful vaginally 2 (two) times daily.  70 g  1  . PATADAY 0.2 % SOLN Use everyday      . PREMARIN 0.9 MG tablet TAKE 1 TABLET BY MOUTH EVERY DAY  30 tablet  5  . promethazine (PHENERGAN) 25 MG tablet Take 1-2 tablets every 6 hours prn  90 tablet  0  . sucralfate (CARAFATE) 1 G tablet Take 1 g by mouth 2 (two) times daily.        Marland Kitchen tiZANidine (ZANAFLEX) 4 MG tablet Take 1 tablet (4 mg total) by mouth every 8 (eight) hours as needed.  90 tablet  1  . triamcinolone (KENALOG) 0.1 % cream Apply topically 3 (three) times daily. As needed for rash  45 g  1  . dexamethasone (DECADRON) 4 MG tablet 4 tablets (16 mg) po at once on day 1 and 4 tabs (16 mg) po at once on day 2  8 tablet  0  . esomeprazole (NEXIUM) 20 MG capsule Take 1 capsule (20 mg total) by mouth daily before breakfast.  90 capsule  3  . furosemide (LASIX) 20 MG tablet Take 20 mg by mouth daily. As needed         Allergies  Allergen Reactions  . Droperidol     twitching  . Amoxicillin-Pot Clavulanate     REACTION: diarrhea  . Ciprofloxacin     Sensitivity.   . Duloxetine     REACTION: diarrhea  . Hydromorphone Hcl   . Iohexol      Code: HIVES, Desc: PT developed 3 hives post injection of Omni 300. Observed by Dr Clifton James. Given 50 mg PO benedryl.Hives subsided before being released., Onset Date: [id number]   . Ketorolac Tromethamine   . Moxifloxacin   . Penicillins     sensitivity  . Pioglitazone     REACTION: weight gain  . Pregabalin     REACTION: anaphylaxis  . Pristiq (Desvenlafaxine Succinate Monohydrate)     abd pains  . Quetiapine  REACTION: nightmares  . Topiramate     Family History  Problem Relation Age of Onset  . Diabetes Mother   . Lung cancer Mother   . Cancer Mother     lung    . Esophageal cancer Father   . Heart disease Father   . Cancer Father     esophageal  . Diabetes Father   . Colon polyps Father     Family History  . Breast cancer Maternal Grandmother   . Heart disease Maternal Grandmother   . Diabetes Maternal Grandmother   . Cancer Maternal Grandmother     breast, colon  . Heart attack Brother 55  . Colon cancer Maternal Grandfather   . Colon polyps Maternal Grandfather   . Diabetes Maternal Grandfather   . Colon polyps Paternal Grandfather   . Colon cancer Paternal Grandfather   . Breast cancer Maternal Aunt   . Arthritis    . Hypertension    . Uterine cancer Other     great grandmother    BP 122/72  Pulse 86  Temp(Src) 97.2 F (36.2 C) (Oral)  Wt 249 lb (112.946 kg)  SpO2 96%    Review of Systems Diarrhea is better.  Denies fever    Objective:   Physical Exam VITAL SIGNS:  See vs page GENERAL: no distress LUNGS:  Clear to auscultation, except for a few rhonchi   (i reviewed spirometry result)    Assessment & Plan:  Recurrent dyspnea, uncertain etiology DM, prob exac by steroids, needs increased rx Recurrent abd pain, prob due to ibs

## 2012-02-23 NOTE — Telephone Encounter (Signed)
We did not discuss this, but i have printed refill x1, in dr plotnikov's absence.

## 2012-02-23 NOTE — Telephone Encounter (Signed)
Ok for refill 1 month supply

## 2012-02-23 NOTE — Patient Instructions (Addendum)
Refer back to a lung specialist.  you will receive a phone call, about a day and time for an appointment. Here are some samples of "onglyza" 5 mg.  Take 1 daily, until blood sugar comes back down to the low-100's.  Call if you need a prescription.  Please see dr Posey Rea as scheduled.

## 2012-02-23 NOTE — Telephone Encounter (Signed)
Pt called stating that SAE was going to refill Vicodin at today's appt. Please advise.

## 2012-02-28 ENCOUNTER — Telehealth: Payer: Self-pay | Admitting: Internal Medicine

## 2012-02-28 NOTE — Telephone Encounter (Signed)
Pt was seen last week by Dr. Everardo All.  She is not better.  Could she be worked in this week?

## 2012-02-28 NOTE — Telephone Encounter (Signed)
OK 4:30 on Fri Thx

## 2012-02-29 ENCOUNTER — Encounter: Payer: Self-pay | Admitting: Adult Health

## 2012-02-29 ENCOUNTER — Ambulatory Visit (INDEPENDENT_AMBULATORY_CARE_PROVIDER_SITE_OTHER): Payer: 59 | Admitting: Adult Health

## 2012-02-29 VITALS — BP 154/96 | HR 93 | Temp 97.2°F | Ht 65.0 in | Wt 249.4 lb

## 2012-02-29 DIAGNOSIS — J45909 Unspecified asthma, uncomplicated: Secondary | ICD-10-CM

## 2012-02-29 MED ORDER — PREDNISONE 10 MG PO TABS: ORAL_TABLET | ORAL | Status: DC

## 2012-02-29 NOTE — Telephone Encounter (Signed)
Pt will come at 4:30 Friday.

## 2012-02-29 NOTE — Progress Notes (Signed)
Subjective:    Patient ID: [name], female    DOB: May 16, 1956, 56 y.o.   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  She was admitted 4/1-12/27/09 for an exacerbation requiring Abx & steroids, destaurated to 87% on discharge but did not require home O2. She has since improved, quit smoking & did not desaturate on ambulating today. Cough & wheezing have resolved, has gained 10 lbs with steroids & smoking cessation.  Husband has sleep apnea & has noted loud snoring, but not witnessed apneas. She reports frequent heartburn , s/p fundoplication '99 & esophageal dilation of UES by Dr Juanda Chance.  IBS & fibromyalgia prevent her from exercisie.    March 09, 2010 Has been off dulera - not needed rescue MDIs. Spirometry showed nml lung function (took duoneb 6 h prior to test)  Reviewed pSG 5/02 - wt 230 lbs -moderate obstructive sleep apnea with RDI 17/h, nadir desatn 87% & few PLMS but not causing arousals - has gained 20 lbs since.     02/29/2012 Acute OV  Complains of  Increased  SOB, some cough w/ very little tan-yellow phlem,wheezing--neb tx's and inhalers are not helping--pt finished levaquin yesterday Has had C. Diff over last year.  Has a lot of gas and bloating . Feels this is causing a lot pressure and hard to take in deep breath.  Has had more dyspnea, and wheezing.  Admitted for GI issues w/ C. Diff 5/19-5/21.  Currently on flagyl.  Seen at urgent care on 1 week ago ,  increase cough and dyspnea. Given Levaquin  X 7 days .  She finished this yesterday.  Complains of wheezing and dry cough     Review of Systems Constitutional:   No  weight loss, night sweats,  Fevers, chills, fatigue, or  lassitude.  HEENT:   No headaches,  Difficulty swallowing,  Tooth/dental problems, or  Sore throat,                No sneezing, itching, ear ache,  +nasal congestion, post nasal drip,   CV:  No chest pain,  Orthopnea, PND, swelling  in lower extremities, anasarca, dizziness, palpitations, syncope.   GI  No heartburn, indigestion, abdominal pain, nausea, vomiting, diarrhea, change in bowel habits, loss of appetite, bloody stools.   Resp:    No coughing up of blood.     No chest wall deformity  Skin: no rash or lesions.  GU: no dysuria, change in color of urine, no urgency or frequency.  No flank pain, no hematuria   MS:  No joint pain or swelling.  No decreased range of motion.  No back pain.  Psych:  No change in mood or affect. No depression or anxiety.  No memory loss.         Objective:   Physical Exam GEN: A/Ox3; pleasant , NAD, well nourished   HEENT:  [place]/AT,  EACs-clear, TMs-wnl, NOSE-clear drainage , THROAT-clear, no lesions, no postnasal drip or exudate noted.   NECK:  Supple w/ fair ROM; no JVD; normal carotid impulses w/o bruits; no thyromegaly or nodules palpated; no lymphadenopathy.  RESP  Coarse BS ; w/o, wheezes/ rales/ or rhonchi.no accessory muscle use, no dullness to percussion  CARD:  RRR, no m/r/g  , no peripheral edema, pulses intact, no cyanosis or clubbing.  GI:   Soft & nt; nml bowel sounds; no organomegaly or masses detected.  Musco: Warm bil, no deformities  or joint swelling noted.   Neuro: alert, no focal deficits noted.    Skin: Warm, no lesions or rashes         Assessment & Plan:

## 2012-02-29 NOTE — Assessment & Plan Note (Signed)
Mild flare /RAD with URI   Plan: Prednsione taper over next week.  Add Chlor tab 4mg  2 tabs At bedtime  As needed  Drainage  Delsym 2 tsp Twice daily  As needed  Cough  Please contact office for sooner follow up if symptoms do not improve or worsen or seek emergency care  follow up Dr. Vassie Loll  In 1 month as planned and As needed

## 2012-02-29 NOTE — Patient Instructions (Addendum)
Prednsione taper over next week.  Add Chlor tab 4mg  2 tabs At bedtime  As needed  Drainage  Delsym 2 tsp Twice daily  As needed  Cough  Please contact office for sooner follow up if symptoms do not improve or worsen or seek emergency care  follow up Dr. Vassie Loll  In 1 month as planned and As needed

## 2012-03-01 ENCOUNTER — Ambulatory Visit: Payer: 59 | Admitting: Internal Medicine

## 2012-03-21 ENCOUNTER — Encounter: Payer: Self-pay | Admitting: Internal Medicine

## 2012-03-21 ENCOUNTER — Ambulatory Visit (INDEPENDENT_AMBULATORY_CARE_PROVIDER_SITE_OTHER): Payer: 59 | Admitting: Internal Medicine

## 2012-03-21 ENCOUNTER — Ambulatory Visit (INDEPENDENT_AMBULATORY_CARE_PROVIDER_SITE_OTHER)
Admission: RE | Admit: 2012-03-21 | Discharge: 2012-03-21 | Disposition: A | Discharge status: Home / Self Care | Payer: 59 | Source: Ambulatory Visit | Attending: Internal Medicine | Admitting: Internal Medicine

## 2012-03-21 VITALS — BP 120/78 | HR 71 | Temp 97.0°F | Ht 65.0 in

## 2012-03-21 DIAGNOSIS — E119 Type 2 diabetes mellitus without complications: Secondary | ICD-10-CM

## 2012-03-21 DIAGNOSIS — J45901 Unspecified asthma with (acute) exacerbation: Secondary | ICD-10-CM

## 2012-03-21 DIAGNOSIS — R05 Cough: Secondary | ICD-10-CM

## 2012-03-21 DIAGNOSIS — J40 Bronchitis, not specified as acute or chronic: Secondary | ICD-10-CM

## 2012-03-21 MED ORDER — MONTELUKAST SODIUM 10 MG PO TABS: 10.0000 mg | ORAL_TABLET | Freq: Every day | ORAL | Status: DC

## 2012-03-21 MED ORDER — PREDNISONE (PAK) 10 MG PO TABS: 10.0000 mg | ORAL_TABLET | ORAL | Status: AC

## 2012-03-21 MED ORDER — FLUTICASONE-SALMETEROL 250-50 MCG/DOSE IN AEPB: 1.0000 | INHALATION_SPRAY | Freq: Two times a day (BID) | RESPIRATORY_TRACT | Status: DC

## 2012-03-21 MED ORDER — METHYLPREDNISOLONE ACETATE 80 MG/ML IJ SUSP
120.0000 mg | Freq: Once | INTRAMUSCULAR | Status: AC
  Administered 2012-03-21: 120 mg via INTRAMUSCULAR

## 2012-03-21 NOTE — Patient Instructions (Addendum)
It was good to see you today. Test(s) ordered today. Your results will be called to you after review (48-72hours after test completion). If any changes need to be made, you will be notified at that time. DepoMedrol shot given to you today Pred taper x 12 days, start singular and Adviar as discussed If you develop worsening symptoms or fever, call and we can reconsider antibiotics, but it does not appear necessary to use antibiotics at this time. Watch your sugars and call if over 200 in next few days - steroids will increase your sugars more than usual for next few days!

## 2012-03-21 NOTE — Progress Notes (Signed)
  Subjective:    Patient ID: [name], female    DOB: 1955/11/05, 56 y.o.   MRN: [id number]  HPI  complains of cough - perisisting since 01/2012 hosp for C diff Multiple visits to PC, pulm and UC for same in past weeks 3 course of antibiotics have changed sputum from yellow to white Briefly improved cough with steroids associated with fatigue, wheeze and dyspnea on exertion but no fever or chest pain  Past Medical History  Diagnosis Date  . Asthma   . COPD (chronic obstructive pulmonary disease)   . Depression   . GERD (gastroesophageal reflux disease)     Dr Juanda Chance  . Gastroparesis   . Migraines   . Menopause   . IBS (irritable bowel syndrome)   . Diabetes mellitus type II   . Peripheral neuropathy   . Fibromyalgia   . Obesity   . Barrett's esophagus   . MRSA (methicillin resistant Staphylococcus aureus)     possible (by verbal report from pt)  . History of hiatal hernia   . OSA (obstructive sleep apnea)     pt denies 02/13/12, 02/20/12  . C. difficile colitis 01/2012 hosp   Review of Systems  Constitutional: Positive for chills. Negative for diaphoresis and unexpected weight change.  HENT: Negative for neck stiffness.   Respiratory: Positive for cough, shortness of breath and wheezing.   Cardiovascular: Negative for palpitations and leg swelling.  Neurological: Negative for headaches.       Objective:   Physical Exam BP 120/78  Pulse 71  Temp 97 F (36.1 C) (Oral)  Ht 5\' 5"  (1.651 m)  SpO2 96% Constitutional: She is overweight, appears well-developed and well-nourished. Coughing but no distress. spouse at side Neck: Normal range of motion. Neck supple. No JVD present. No thyromegaly present.  Cardiovascular: Normal rate, regular rhythm and normal heart sounds.  No murmur heard. No BLE edema. Pulmonary/Chest: Effort normal at rest, Bilateral breath sounds course with exp wheeze. Cough spasms  Neurological: She is alert and oriented to person, place, and time.  No cranial nerve deficit. Coordination normal.   Psychiatric: She has a normal mood and affect. Her behavior is normal. Judgment and thought content normal.   Lab Results  Component Value Date   WBC 9.5 02/15/2012   HGB 12.2 02/15/2012   HCT 35.3* 02/15/2012   PLT 228 02/15/2012   GLUCOSE 113* 02/15/2012   CHOL 167 07/26/2010   TRIG 332.0* 07/26/2010   HDL 43.10 07/26/2010   LDLDIRECT 80.3 07/26/2010   ALT 45* 02/12/2012   AST 42* 02/12/2012   NA 141 02/15/2012   K 3.8 02/15/2012   CL 105 02/15/2012   CREATININE 0.70 02/15/2012   BUN 7 02/15/2012   CO2 25 02/15/2012   TSH 3.68 07/26/2010   HGBA1C 6.4* 02/12/2012   MICROALBUR 0.4 07/26/2010       Assessment & Plan:  Prolonged asthma exacerbation with cough DM2 Recent C diff 01/2012  Check CXR now - hold antibiotics unless infiltrate IM medrol and long pred taper Add singular and Advair Continue Duoneb/Ventolin as needed follow up pulm Vassie Loll 7/19 as planned, call sooner if worse patient advised on anticipated increase in cbgs due to steroid effect for next several days - pt will call if cbg>200

## 2012-03-25 ENCOUNTER — Ambulatory Visit (INDEPENDENT_AMBULATORY_CARE_PROVIDER_SITE_OTHER): Payer: 59 | Admitting: Internal Medicine

## 2012-03-25 ENCOUNTER — Encounter: Payer: Self-pay | Admitting: Internal Medicine

## 2012-03-25 ENCOUNTER — Other Ambulatory Visit (INDEPENDENT_AMBULATORY_CARE_PROVIDER_SITE_OTHER): Payer: 59

## 2012-03-25 ENCOUNTER — Telehealth: Payer: Self-pay | Admitting: Internal Medicine

## 2012-03-25 VITALS — BP 130/88 | HR 67 | Temp 97.7°F | Resp 16 | Ht 65.0 in | Wt 250.0 lb

## 2012-03-25 DIAGNOSIS — E119 Type 2 diabetes mellitus without complications: Secondary | ICD-10-CM

## 2012-03-25 DIAGNOSIS — J45909 Unspecified asthma, uncomplicated: Secondary | ICD-10-CM

## 2012-03-25 LAB — BASIC METABOLIC PANEL
BUN: 19 mg/dL (ref 6–23)
CO2: 24 mEq/L (ref 19–32)
Calcium: 9.6 mg/dL (ref 8.4–10.5)
Chloride: 102 mEq/L (ref 96–112)
Creatinine, Ser: 0.7 mg/dL (ref 0.4–1.2)
Glucose, Bld: 212 mg/dL — ABNORMAL HIGH (ref 70–99)

## 2012-03-25 MED ORDER — INSULIN DETEMIR 100 UNIT/ML ~~LOC~~ SOLN: 20.0000 [IU] | Freq: Every day | SUBCUTANEOUS | Status: DC

## 2012-03-25 NOTE — Assessment & Plan Note (Signed)
This appears to be improvig

## 2012-03-25 NOTE — Progress Notes (Signed)
Subjective:    Patient ID: [name], female    DOB: [birth year], 56 y.o.   MRN: [id number]  Diabetes She presents for her follow-up diabetic visit. She has type 2 diabetes mellitus. Her disease course has been worsening. There are no hypoglycemic associated symptoms. Pertinent negatives for hypoglycemia include no dizziness, headaches, pallor, seizures, speech difficulty or tremors. Associated symptoms include polydipsia, polyphagia and polyuria. Pertinent negatives for diabetes include no blurred vision, no chest pain, no fatigue, no foot paresthesias, no foot ulcerations, no visual change, no weakness and no weight loss. There are no hypoglycemic complications. Symptoms are worsening. Symptoms have been present for 5 days. There are no diabetic complications. Current diabetic treatment includes oral agent (monotherapy). Her weight is stable. She is following a generally healthy diet. Meal planning includes avoidance of concentrated sweets. She has not had a previous visit with a dietician. She participates in exercise intermittently. Her home blood glucose trend is increasing rapidly. Her breakfast blood glucose range is generally 140-180 mg/dl. Her lunch blood glucose range is generally >200 mg/dl. Her dinner blood glucose range is generally >200 mg/dl. Her highest blood glucose is >200 mg/dl. Her overall blood glucose range is >200 mg/dl. An ACE inhibitor/angiotensin II receptor blocker is not being taken.      Review of Systems  Constitutional: Negative for fever, chills, weight loss, diaphoresis, activity change, appetite change, fatigue and unexpected weight change.  HENT: Negative.   Eyes: Negative.  Negative for blurred vision.  Respiratory: Positive for cough and wheezing. Negative for chest tightness, shortness of breath and stridor.   Cardiovascular: Negative for chest pain, palpitations and leg swelling.  Gastrointestinal: Negative for nausea, vomiting, abdominal pain, diarrhea,  constipation, blood in stool and abdominal distention.  Genitourinary: Positive for polyuria.  Musculoskeletal: Negative for myalgias, back pain, joint swelling, arthralgias and gait problem.  Skin: Negative for color change, pallor, rash and wound.  Neurological: Negative for dizziness, tremors, seizures, syncope, facial asymmetry, speech difficulty, weakness, light-headedness, numbness and headaches.  Hematological: Positive for polydipsia and polyphagia. Negative for adenopathy. Does not bruise/bleed easily.  Psychiatric/Behavioral: Negative.        Objective:   Physical Exam  Vitals reviewed. Constitutional: She is oriented to person, place, and time. She appears well-developed and well-nourished.  Non-toxic appearance. She does not have a sickly appearance. She does not appear ill. No distress.  HENT:  Head: Normocephalic and atraumatic.  Mouth/Throat: Oropharynx is clear and moist. No oropharyngeal exudate.  Eyes: Conjunctivae are normal. Right eye exhibits no discharge. Left eye exhibits no discharge. No scleral icterus.  Neck: Normal range of motion. Neck supple. No JVD present. No tracheal deviation present. No thyromegaly present.  Cardiovascular: Normal rate, regular rhythm, normal heart sounds and intact distal pulses.  Exam reveals no gallop and no friction rub.   No murmur heard. Pulmonary/Chest: Effort normal. No accessory muscle usage or stridor. Not tachypneic. No respiratory distress. She has no decreased breath sounds. She has wheezes in the right lower field and the left lower field. She has no rhonchi. She has no rales. She exhibits no tenderness.  Abdominal: Soft. Bowel sounds are normal. She exhibits no distension and no mass. There is no tenderness. There is no rebound and no guarding.  Musculoskeletal: Normal range of motion. She exhibits no edema and no tenderness.  Lymphadenopathy:    She has no cervical adenopathy.  Neurological: She is oriented to person, place,  and time.  Skin: Skin is warm and dry. No rash  noted. She is not diaphoretic. No erythema. No pallor.  Psychiatric: She has a normal mood and affect. Her behavior is normal. Judgment and thought content normal.      Lab Results  Component Value Date   WBC 9.5 02/15/2012   HGB 12.2 02/15/2012   HCT 35.3* 02/15/2012   PLT 228 02/15/2012   GLUCOSE 113* 02/15/2012   CHOL 167 07/26/2010   TRIG 332.0* 07/26/2010   HDL 43.10 07/26/2010   LDLDIRECT 80.3 07/26/2010   ALT 45* 02/12/2012   AST 42* 02/12/2012   NA 141 02/15/2012   K 3.8 02/15/2012   CL 105 02/15/2012   CREATININE 0.70 02/15/2012   BUN 7 02/15/2012   CO2 25 02/15/2012   TSH 3.68 07/26/2010   HGBA1C 6.4* 02/12/2012   MICROALBUR 0.4 07/26/2010      Assessment & Plan:

## 2012-03-25 NOTE — Patient Instructions (Signed)

## 2012-03-25 NOTE — Assessment & Plan Note (Signed)
She has had a dramatic increase in her blood sugar since starting steroids last week, I will check her BMP today to look for dehydration and abnormal lytes as well as an a1c and have started her on levemir

## 2012-03-25 NOTE — Telephone Encounter (Signed)
  Caller: [name]/Patient; PCP: Plotnikov, Alex; CB#: [phone]; ; ; Call regarding BG Elevated and On Prednisone.  Last BG this AM was 205 fasting.  She has increse her Metformin from 500mg  BID to 1000mg  BID.  Pt is flushed and nauseated.  BG has been running high since  03/22/12.  Triaged DM Control Problems and BG now 205 but having S/S of Ketoacidosis.  Disp = needs to be seen in the E/R for flushed skin, nausea, thirst and more frequent uination.  Pt is calling to get some insulin. Cannot access [employer].  Pt inst needs to be seen emergently for re eval.  Pt crying because she does not want to go ot the E/R again.   Able to get pt in with Dr. Leitha Schuller at [phone]. She is coming to the ofc right now.  Travel inst given.

## 2012-03-27 ENCOUNTER — Telehealth: Payer: Self-pay

## 2012-03-27 NOTE — Telephone Encounter (Signed)
[facility] Triage Call Report Triage Record Num: [id number] Operator: Audelia Hives Patient Name: [name] Call Date & Time: 03/26/2012 9:48:23PM Patient Phone: [phone] PCP: Sonda Primes Patient Gender: Female PCP Fax : [phone] Patient DOB: 1956-01-10 Practice Name: [facility] Reason for Call: Caller: [name]/Patient; PCP: Sonda Primes; CB#: [phone]; Call regarding BS Is Elevated; Pt calling regarding elevated BS 246, pt just started Levemir 20 units 03/25/12 at [facility], on Prednisone for URI and still has 6 days left. Order per EPIC to take Levemir 20 units hs and already took at 10 am 03/26/12. BS over the weekend was 285-300, advised to call if >200. Emergent s/s for Diabetes:Control Problems r/o per protocol except for see in 4 hrs due to increasing s/s of glucose not within provider guidelines and change in medication in last 72 hrs. Per Dr. Alwyn Ren take Prednisone as directed tonight and check BS in am, if FBS >120 take 2o units Levemir. Call back parameters given and will f/u with office 03/27/12. Protocol(s) Used: Diabetes: Control Problems Recommended Outcome per Protocol: See Provider within 4 hours Reason for Outcome: New or increasing symptoms OR glucose not within provider defined guidelines AND prescribed change in medication or treatment plan in last 72 hours Care Advice: ~ Call provider if symptoms worsen before scheduled appointment. Go to ED immediately if blood sugar is 300 mg/dl or more AND symptoms are present including: large urine ketones, rapid, deep breathing, fruity breath, nausea, vomiting or abdominal pain; confused thinking, dry, flushed skin; extreme thirst, extreme fatigue or weakness; or frequent urination. ~ ~ SYMPTOM / CONDITION MANAGEMENT Medication Advice: - Discontinue all nonprescription and alternative medications, especially stimulants, until evaluated by provider. - Take prescribed medications as directed, following label  instructions for the medication. - Do not change medications or dosing regimen until provider is consulted. - Know possible side effects of medication and what to do if they occur. - Tell provider all prescription, nonprescription or alternative medications that you take ~ High Blood Sugar Treatment: - Follow action plan. - Adjust medications if instructed to do so in action plan or by provider. - Test blood sugar and ketones more often. - Record blood sugar and ketones in meter log or separate logbook, and take to all provider visits. - Drink extra water and other non-sugar fluids to prevent dehydration when the blood sugar is high and urine ketones are present. ~ 03/26/2012 11:27:15PM Page 1 of 1 [email or web address]

## 2012-04-11 ENCOUNTER — Institutional Professional Consult (permissible substitution): Payer: 59 | Admitting: Pulmonary Disease

## 2012-04-11 ENCOUNTER — Telehealth: Payer: Self-pay | Admitting: Pulmonary Disease

## 2012-04-11 NOTE — Telephone Encounter (Signed)
I spoke with pt and she is scheduled to see [facility] tomorrow at 4:15 since this was cancelled by our office. Nothing further was needed

## 2012-04-12 ENCOUNTER — Encounter: Payer: Self-pay | Admitting: Pulmonary Disease

## 2012-04-12 ENCOUNTER — Ambulatory Visit (INDEPENDENT_AMBULATORY_CARE_PROVIDER_SITE_OTHER): Payer: 59 | Admitting: Pulmonary Disease

## 2012-04-12 VITALS — BP 102/64 | HR 77 | Temp 98.0°F | Ht 65.0 in | Wt 246.0 lb

## 2012-04-12 DIAGNOSIS — J45909 Unspecified asthma, uncomplicated: Secondary | ICD-10-CM

## 2012-04-12 DIAGNOSIS — G4730 Sleep apnea, unspecified: Secondary | ICD-10-CM

## 2012-04-12 MED ORDER — MOMETASONE FURO-FORMOTEROL FUM 200-5 MCG/ACT IN AERO: 2.0000 | INHALATION_SPRAY | Freq: Two times a day (BID) | RESPIRATORY_TRACT | Status: DC

## 2012-04-12 NOTE — Assessment & Plan Note (Signed)
Episodic cough/ wheezing - doubt 'true' asthma - spirometry no obstruction during flare 5/13 Her resp symptoms seem to have been triggered by her abdominal issues -Will treat her as asthma for now with steroid/LABA combination & singulair (for allergies) Due to issues with advair, OK to switch back to dulera 200 bid Can take singulair in daytime - not to combine with vistaril

## 2012-04-12 NOTE — Assessment & Plan Note (Signed)
pSG 5/02 - wt 230 lbs -moderate obstructive sleep apnea with RDI 17/h, nadir desatn 87% Will revisit once abdominal issues resolved

## 2012-04-12 NOTE — Patient Instructions (Signed)
Trial of symbicort 160 2 puffs twice daily INSTEAD of advair Call us for Rx if this owrks OK to take singulair in daytime Lung function in may was ok

## 2012-04-12 NOTE — Progress Notes (Signed)
  Subjective:    Patient ID: [name], female    DOB: 1956/02/03, 56 y.o.   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  She was admitted 4/1-12/27/09 for an exacerbation requiring Abx & steroids. She has since improved, quit smoking & did not desaturate on ambulating today.   Husband has sleep apnea & has noted loud snoring, but not witnessed apneas. She reports frequent heartburn , s/p fundoplication '99 & esophageal dilation of UES by Dr Juanda Chance.  IBS & fibromyalgia prevent her from exercisie.  6/11 -Spirometry showed nml lung function - dulera stopped pSG 5/02 - wt 230 lbs -moderate obstructive sleep apnea with RDI 17/h, nadir desatn 87% & few PLMS but not causing arousals - has gained 20 lbs since.    04/12/2012 Had a lot of gas and bloating over past few months. Feels this is causing a lot pressure and hard to take in deep breath. - changed GI to [facility]) Admitted for GI issues w/ C. Diff 5/19-5/21.  Spirometry 5/31 showed FEv1 79%, mild restriction CXR /13 lingular scarring Seen at urgent care 6/13 Given Levaquin X 7 days .   C/o wheezing and dry cough  >> Acute OV 6/13 -pred taper Then saw dr Felicity Coyer --> pred shot & taper, high sugars requiring insulin, advair + nebs Finally has improved somewhat now , off prednisone, advair causes poor taste in mouth - some hoarseness. Using vistaril for sleep, cannot combine with singulair   Review of Systems Patient denies significant dyspnea,cough, hemoptysis,  chest pain, palpitations, pedal edema, orthopnea, paroxysmal nocturnal dyspnea, lightheadedness, nausea, vomiting, abdominal or  leg pains      Objective:   Physical Exam  Gen. Pleasant, obese, in no distress ENT - no lesions, no post nasal drip Neck: No JVD, no thyromegaly, no carotid bruits Lungs: no use of accessory muscles, no dullness to percussion, decreased without rales or rhonchi  Cardiovascular:  Rhythm regular, heart sounds  normal, no murmurs or gallops, no peripheral edema Musculoskeletal: No deformities, no cyanosis or clubbing , no tremors        Assessment & Plan:

## 2012-04-15 ENCOUNTER — Telehealth: Payer: Self-pay | Admitting: *Deleted

## 2012-04-15 MED ORDER — CLONAZEPAM 1 MG PO TABS: 1.0000 mg | ORAL_TABLET | Freq: Three times a day (TID) | ORAL | Status: DC | PRN

## 2012-04-15 NOTE — Telephone Encounter (Signed)
OK to fill this prescription with additional refills x2 Thank you!  

## 2012-04-15 NOTE — Telephone Encounter (Signed)
Done

## 2012-04-15 NOTE — Telephone Encounter (Signed)
Rf req for Clonazepam 1 mg 1-3 po qd prn. Last filled 11/20/11. Ok to RF?

## 2012-04-16 ENCOUNTER — Encounter: Payer: Self-pay | Admitting: Internal Medicine

## 2012-04-16 ENCOUNTER — Ambulatory Visit (INDEPENDENT_AMBULATORY_CARE_PROVIDER_SITE_OTHER): Payer: 59 | Admitting: Internal Medicine

## 2012-04-16 VITALS — BP 122/90 | HR 80 | Temp 97.8°F | Resp 16 | Wt 246.0 lb

## 2012-04-16 DIAGNOSIS — A0472 Enterocolitis due to Clostridium difficile, not specified as recurrent: Secondary | ICD-10-CM

## 2012-04-16 DIAGNOSIS — E119 Type 2 diabetes mellitus without complications: Secondary | ICD-10-CM

## 2012-04-16 DIAGNOSIS — Z8619 Personal history of other infectious and parasitic diseases: Secondary | ICD-10-CM | POA: Insufficient documentation

## 2012-04-16 DIAGNOSIS — IMO0001 Reserved for inherently not codable concepts without codable children: Secondary | ICD-10-CM

## 2012-04-16 DIAGNOSIS — F329 Major depressive disorder, single episode, unspecified: Secondary | ICD-10-CM

## 2012-04-16 DIAGNOSIS — J45909 Unspecified asthma, uncomplicated: Secondary | ICD-10-CM

## 2012-04-16 NOTE — Assessment & Plan Note (Signed)
Continue with current prescription therapy as reflected on the Med list.  

## 2012-04-16 NOTE — Progress Notes (Signed)
   Subjective:    Patient ID: [name], female    DOB: 04-06-1956, 56 y.o.   MRN: [id number]  HPI   The patient is here to follow up on chronic depression, anxiety, headaches and chronic moderate fibromyalgia/LBP symptoms controlled with medicines. F/u chronic GI complaints. F/u hosp stay for C diff diarrhea and a URI  Wt Readings from Last 3 Encounters:  04/16/12 246 lb (111.585 kg)  04/12/12 246 lb (111.585 kg)  03/25/12 250 lb (113.399 kg)   BP Readings from Last 3 Encounters:  04/16/12 122/90  04/12/12 102/64  03/25/12 130/88     Review of Systems  Constitutional: Positive for fatigue. Negative for activity change, appetite change and unexpected weight change.  HENT: Negative for congestion and mouth sores.   Eyes: Negative for visual disturbance.  Respiratory: Negative for chest tightness.   Genitourinary: Negative for frequency, difficulty urinating and vaginal pain.  Musculoskeletal: Positive for arthralgias. Negative for gait problem.  Skin: Negative for pallor.  Neurological: Negative for tremors.  Psychiatric/Behavioral: Positive for decreased concentration. Negative for suicidal ideas, confusion and disturbed wake/sleep cycle. The patient is nervous/anxious.        Objective:   Physical Exam  Constitutional: She appears well-developed. No distress.       Obese  HENT:  Head: Normocephalic.  Right Ear: External ear normal.  Left Ear: External ear normal.  Nose: Nose normal.  Mouth/Throat: Oropharynx is clear and moist.  Eyes: Conjunctivae are normal. Pupils are equal, round, and reactive to light. Right eye exhibits no discharge. Left eye exhibits no discharge.  Neck: Normal range of motion. Neck supple. No JVD present. No tracheal deviation present. No thyromegaly present.  Cardiovascular: Normal rate, regular rhythm and normal heart sounds.   Pulmonary/Chest: No stridor. No respiratory distress. She has wheezes. She has rales.  Abdominal: Soft. Bowel  sounds are normal. She exhibits no distension and no mass. There is no tenderness. There is no rebound and no guarding.  Musculoskeletal: She exhibits no edema and no tenderness.  Lymphadenopathy:    She has no cervical adenopathy.  Neurological: She displays normal reflexes. No cranial nerve deficit. She exhibits normal muscle tone. Coordination normal.  Skin: No rash noted. No erythema.  Psychiatric: Her behavior is normal. Judgment and thought content normal.       depressed   Lab Results  Component Value Date   WBC 9.5 02/15/2012   HGB 12.2 02/15/2012   HCT 35.3* 02/15/2012   PLT 228 02/15/2012   GLUCOSE 212* 03/25/2012   CHOL 167 07/26/2010   TRIG 332.0* 07/26/2010   HDL 43.10 07/26/2010   LDLDIRECT 80.3 07/26/2010   ALT 45* 02/12/2012   AST 42* 02/12/2012   NA 137 03/25/2012   K 3.9 03/25/2012   CL 102 03/25/2012   CREATININE 0.7 03/25/2012   BUN 19 03/25/2012   CO2 24 03/25/2012   TSH 3.68 07/26/2010   HGBA1C 7.2* 03/25/2012   MICROALBUR 0.4 07/26/2010          Assessment & Plan:

## 2012-04-16 NOTE — Assessment & Plan Note (Signed)
Lost wt °

## 2012-05-07 ENCOUNTER — Other Ambulatory Visit: Payer: Self-pay | Admitting: *Deleted

## 2012-05-07 NOTE — Telephone Encounter (Signed)
R'cd fax from [facility] for refill of Hydrocodone-APAP [phone]-last written 02/23/2012 #120 with 0 refills-please advise.

## 2012-05-08 MED ORDER — HYDROCODONE-ACETAMINOPHEN 10-660 MG PO TABS: 1.0000 | ORAL_TABLET | Freq: Four times a day (QID) | ORAL | Status: DC | PRN

## 2012-05-08 NOTE — Telephone Encounter (Signed)
OK to fill this prescription with additional refills x0 Thank you!  

## 2012-05-08 NOTE — Telephone Encounter (Signed)
Rx printed, awaiting MD's signature.  

## 2012-05-09 NOTE — Telephone Encounter (Signed)
Rx faxed to [facility].  

## 2012-05-10 ENCOUNTER — Telehealth: Payer: Self-pay | Admitting: Internal Medicine

## 2012-05-10 DIAGNOSIS — G43909 Migraine, unspecified, not intractable, without status migrainosus: Secondary | ICD-10-CM

## 2012-05-11 NOTE — Telephone Encounter (Signed)
What is it for? Thx 

## 2012-05-13 ENCOUNTER — Other Ambulatory Visit: Payer: Self-pay | Admitting: Internal Medicine

## 2012-05-13 NOTE — Telephone Encounter (Signed)
Ok Dr Tat

## 2012-05-13 NOTE — Telephone Encounter (Signed)
This is for her migraines. Per pt's spouse her current neuro office is no longer accepting her ins. And she is having trouble paying for her meds. 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

## 2012-05-14 ENCOUNTER — Telehealth: Payer: Self-pay | Admitting: *Deleted

## 2012-05-14 MED ORDER — PROMETHAZINE-CODEINE 6.25-10 MG/5ML PO SYRP: 5.0000 mL | ORAL_SOLUTION | ORAL | Status: DC | PRN

## 2012-05-14 MED ORDER — PROMETHAZINE-CODEINE 6.25-10 MG/5ML PO SYRP: 5.0000 mL | ORAL_SOLUTION | ORAL | Status: AC | PRN

## 2012-05-14 NOTE — Telephone Encounter (Signed)
Ok to call in Thx 

## 2012-05-14 NOTE — Telephone Encounter (Signed)
Done

## 2012-05-14 NOTE — Telephone Encounter (Signed)
Rf req for prometh/codeine syr. 1-2 tsp po q 4 hr prn. Ok to Rf?

## 2012-05-24 IMAGING — CT CT ABD-PELV W/O CM
3 of 4 series · 14 of 32 positions shown, 19 images · non-contrast
Comparison: CT scan dated 04/26/2011

CLINICAL DATA: Upper abdominal pain with nausea and vomiting.

CT ABDOMEN AND PELVIS WITHOUT CONTRAST
TECHNIQUE: Multidetector CT imaging of the abdomen and pelvis was
performed following the standard protocol without intravenous
contrast.

[Series 2: routine abdomen · axial · 0.95mm/px · z∈[-432,-137]mm · 4 of 99 slices shown, 9 images]
[im 20/99  soft-tissue]
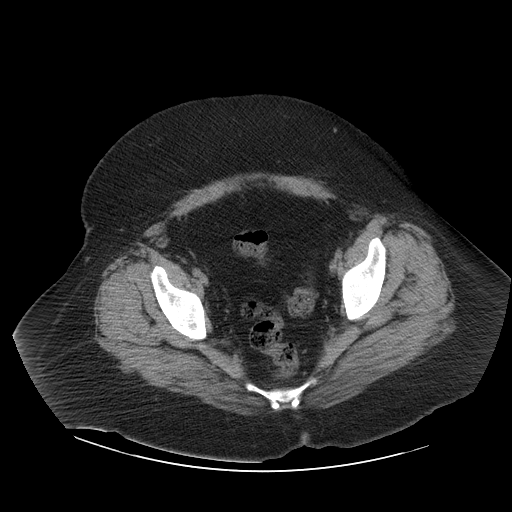
[im 20/99  lung]
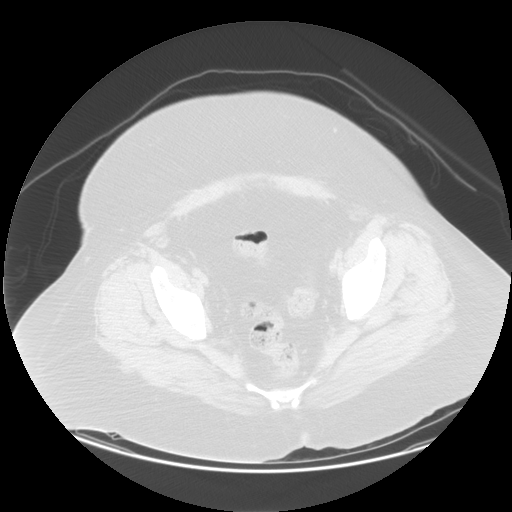
[im 20/99  bone]
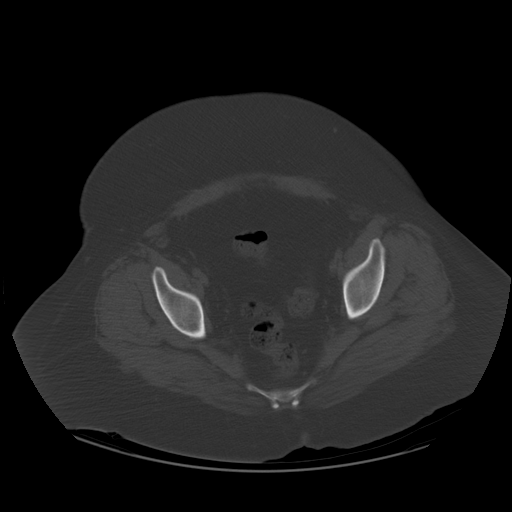
[im 40/99  soft-tissue]
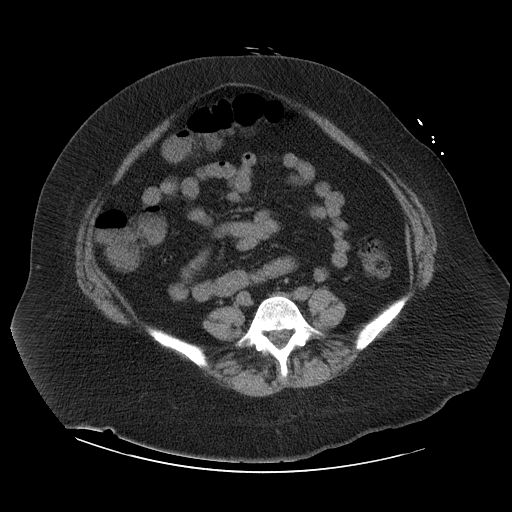
[im 40/99  lung]
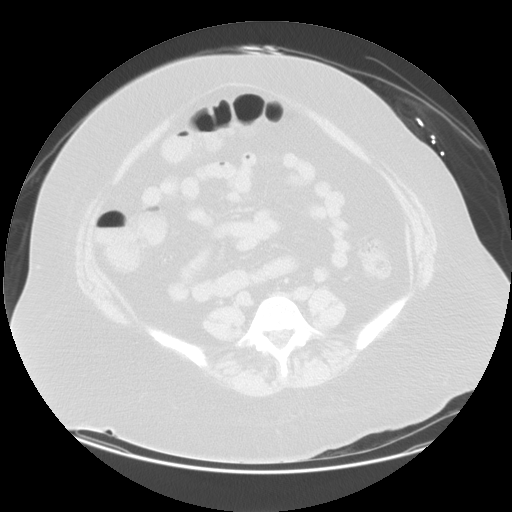
[im 59/99  soft-tissue]
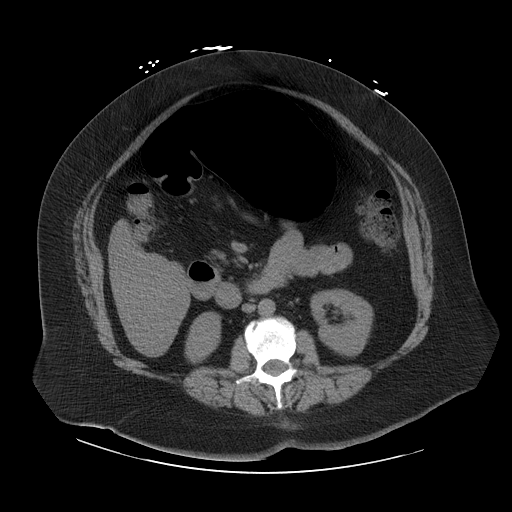
[im 59/99  lung]
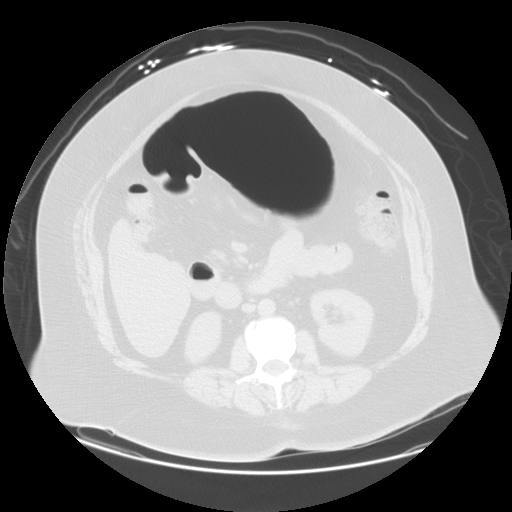
[im 79/99  soft-tissue]
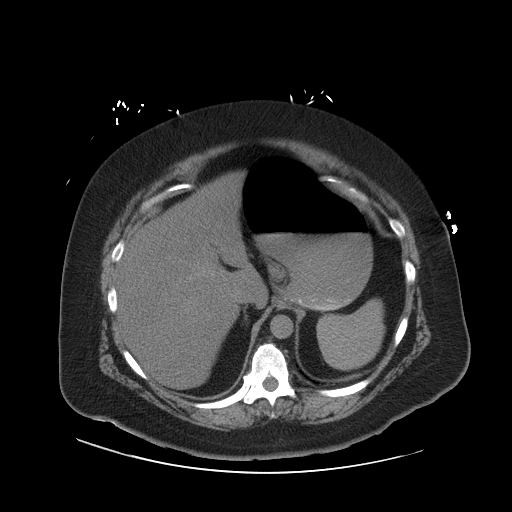
[im 79/99  lung]
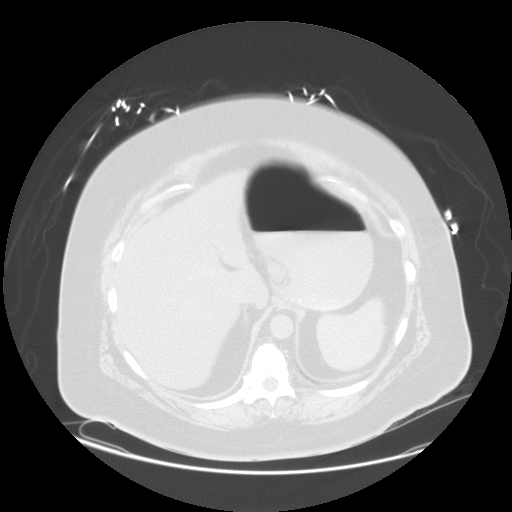

[Series 400: cor · coronal · 0.98mm/px · 2 of 211 slices shown]
[im 20/211  soft-tissue]
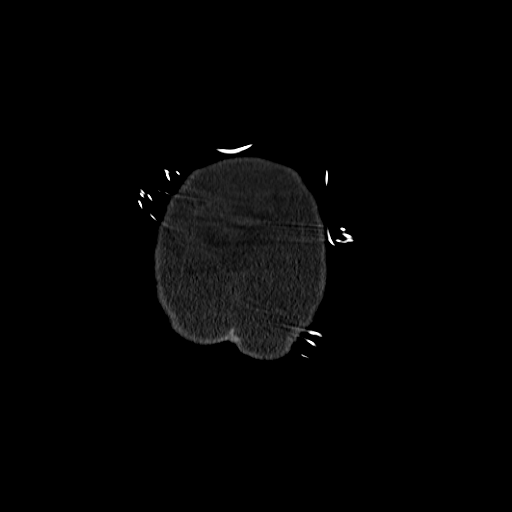
[im 39/211  soft-tissue]
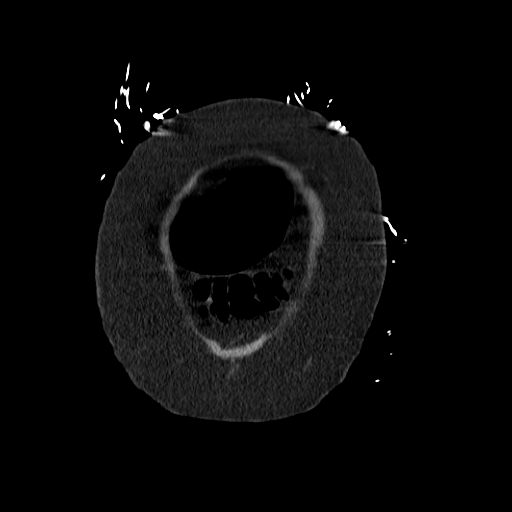

[Series 401: sag · sagittal · 0.98mm/px · 8 of 240 slices shown]
[im 20/240  soft-tissue]
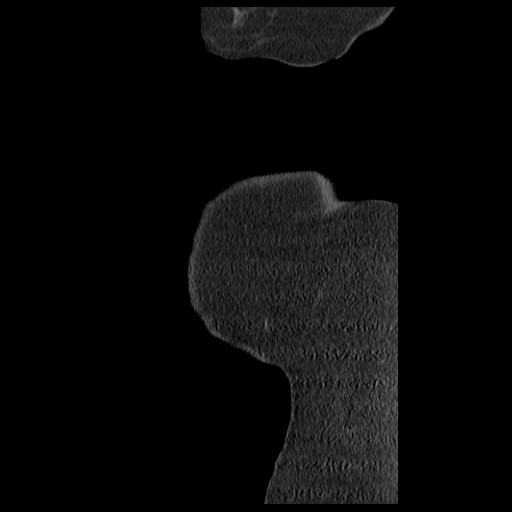
[im 60/240  soft-tissue]
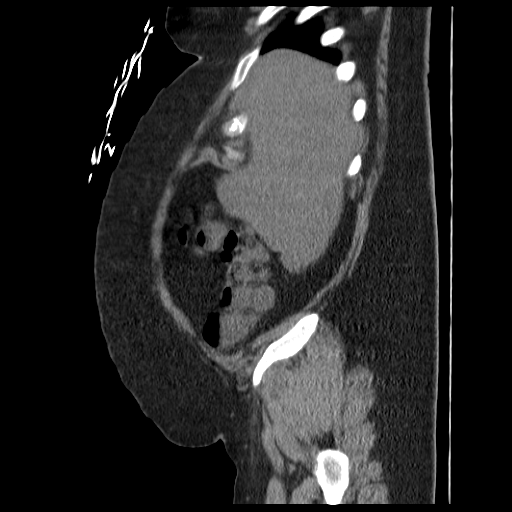
[im 80/240  soft-tissue]
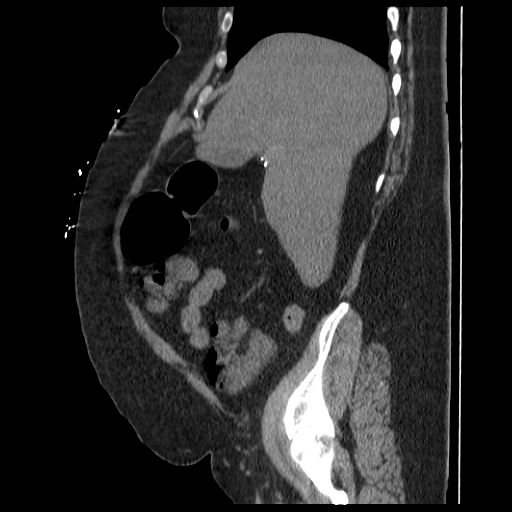
[im 100/240  soft-tissue]
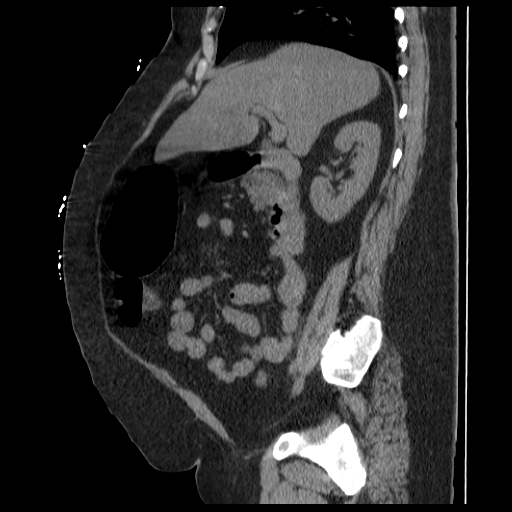
[im 140/240  soft-tissue]
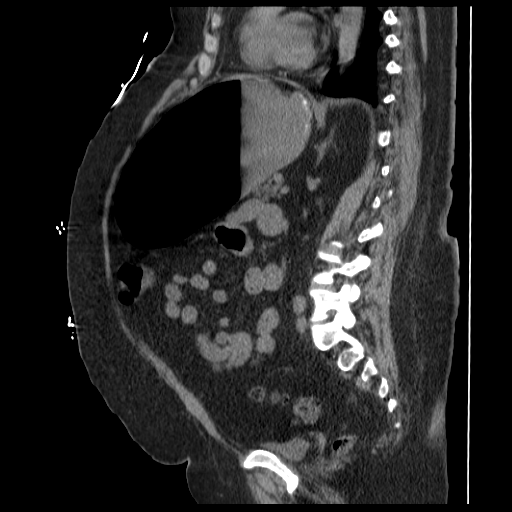
[im 160/240  soft-tissue]
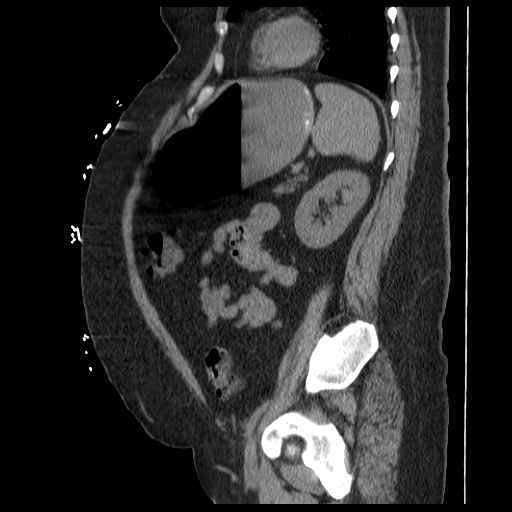
[im 180/240  soft-tissue]
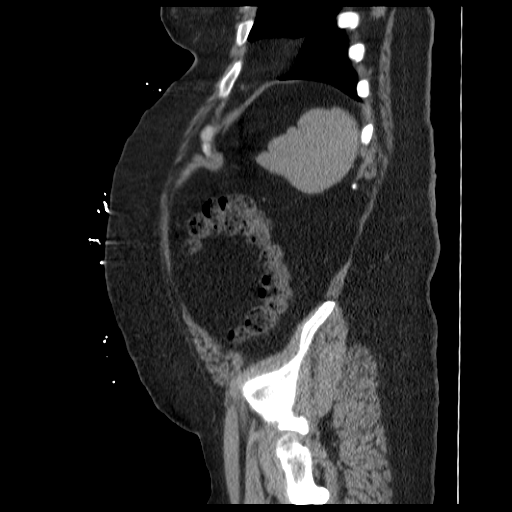
[im 220/240  soft-tissue]
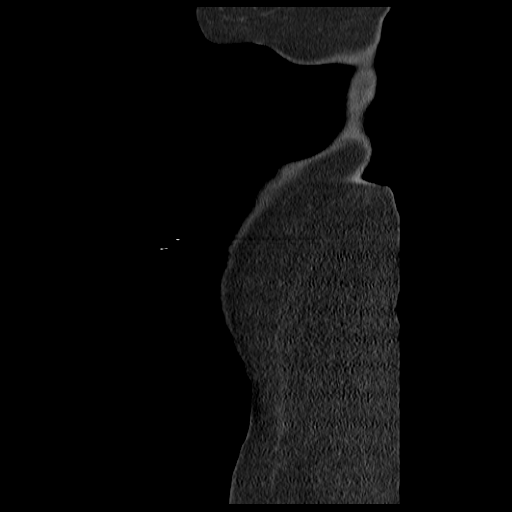

[14 of 32 positions shown; findings below may reference images not displayed]

FINDINGS: There is gaseous distention of the stomach.  The duodenum
is not distended. Large and small bowel appear normal.  Liver,
spleen, pancreas, adrenal glands, and kidneys are normal.
Gallbladder has been removed.  Uterus and ovaries have been
removed.  Appendix is been removed.  No acute osseous abnormality.
I have evidence of previous Nissen fundoplication.
IMPRESSION: Gaseous distention of the stomach without evidence of duodenal
obstruction.  Otherwise benign-appearing abdomen and pelvis.

## 2012-07-02 ENCOUNTER — Other Ambulatory Visit: Payer: Self-pay | Admitting: Endocrinology

## 2012-07-03 NOTE — Telephone Encounter (Signed)
This is a Plot pt.

## 2012-07-05 NOTE — Telephone Encounter (Signed)
Ok to R?

## 2012-07-24 ENCOUNTER — Ambulatory Visit (INDEPENDENT_AMBULATORY_CARE_PROVIDER_SITE_OTHER): Payer: 59 | Admitting: Internal Medicine

## 2012-07-24 ENCOUNTER — Encounter: Payer: Self-pay | Admitting: Internal Medicine

## 2012-07-24 ENCOUNTER — Other Ambulatory Visit (INDEPENDENT_AMBULATORY_CARE_PROVIDER_SITE_OTHER): Payer: 59

## 2012-07-24 VITALS — BP 130/84 | HR 88 | Temp 97.7°F | Resp 16

## 2012-07-24 DIAGNOSIS — A0472 Enterocolitis due to Clostridium difficile, not specified as recurrent: Secondary | ICD-10-CM

## 2012-07-24 DIAGNOSIS — F329 Major depressive disorder, single episode, unspecified: Secondary | ICD-10-CM

## 2012-07-24 DIAGNOSIS — K589 Irritable bowel syndrome without diarrhea: Secondary | ICD-10-CM

## 2012-07-24 DIAGNOSIS — G43909 Migraine, unspecified, not intractable, without status migrainosus: Secondary | ICD-10-CM

## 2012-07-24 DIAGNOSIS — Z23 Encounter for immunization: Secondary | ICD-10-CM

## 2012-07-24 DIAGNOSIS — E119 Type 2 diabetes mellitus without complications: Secondary | ICD-10-CM

## 2012-07-24 DIAGNOSIS — IMO0001 Reserved for inherently not codable concepts without codable children: Secondary | ICD-10-CM

## 2012-07-24 DIAGNOSIS — R197 Diarrhea, unspecified: Secondary | ICD-10-CM

## 2012-07-24 DIAGNOSIS — J45909 Unspecified asthma, uncomplicated: Secondary | ICD-10-CM

## 2012-07-24 LAB — BASIC METABOLIC PANEL
CO2: 29 mEq/L (ref 19–32)
Calcium: 9.1 mg/dL (ref 8.4–10.5)
Chloride: 105 mEq/L (ref 96–112)
Creatinine, Ser: 0.8 mg/dL (ref 0.4–1.2)
GFR: 84.98 mL/min (ref >60.00)
Potassium: 3.8 mEq/L (ref 3.5–5.1)

## 2012-07-24 MED ORDER — DICYCLOMINE HCL 20 MG PO TABS: 20.0000 mg | ORAL_TABLET | Freq: Three times a day (TID) | ORAL | Status: DC | PRN

## 2012-07-24 MED ORDER — CLONAZEPAM 1 MG PO TABS: 1.0000 mg | ORAL_TABLET | Freq: Three times a day (TID) | ORAL | Status: DC | PRN

## 2012-07-24 MED ORDER — HYDROCODONE-ACETAMINOPHEN 10-660 MG PO TABS: ORAL_TABLET | ORAL | Status: DC

## 2012-07-24 NOTE — Assessment & Plan Note (Signed)
C diff toxin assay

## 2012-07-24 NOTE — Assessment & Plan Note (Signed)
Continue with current prescription therapy as reflected on the Med list.  

## 2012-07-24 NOTE — Assessment & Plan Note (Signed)
Repeat stool test

## 2012-07-24 NOTE — Assessment & Plan Note (Signed)
Wt Readings from Last 3 Encounters:  04/16/12 246 lb (111.585 kg)  04/12/12 246 lb (111.585 kg)  03/25/12 250 lb (113.399 kg)

## 2012-07-24 NOTE — Progress Notes (Signed)
   Subjective:    Patient ID: [name], female    DOB: May 09, 1956, 56 y.o.   MRN: [id number]  HPI   The patient is here to follow up on chronic depression, anxiety, headaches and chronic moderate fibromyalgia/LBP symptoms controlled with medicines. F/u chronic GI complaints. F/u a repeat case of C diff diarrhea  Wt Readings from Last 3 Encounters:  04/16/12 246 lb (111.585 kg)  04/12/12 246 lb (111.585 kg)  03/25/12 250 lb (113.399 kg)   BP Readings from Last 3 Encounters:  07/24/12 130/84  04/16/12 122/90  04/12/12 102/64     Review of Systems  Constitutional: Positive for fatigue. Negative for activity change, appetite change and unexpected weight change.  HENT: Negative for congestion and mouth sores.   Eyes: Negative for visual disturbance.  Respiratory: Negative for chest tightness.   Genitourinary: Negative for frequency, difficulty urinating and vaginal pain.  Musculoskeletal: Positive for arthralgias. Negative for gait problem.  Skin: Negative for pallor.  Neurological: Negative for tremors.  Psychiatric/Behavioral: Positive for decreased concentration. Negative for suicidal ideas, confusion and disturbed wake/sleep cycle. The patient is nervous/anxious.        Objective:   Physical Exam  Constitutional: She appears well-developed. No distress.       Obese  HENT:  Head: Normocephalic.  Right Ear: External ear normal.  Left Ear: External ear normal.  Nose: Nose normal.  Mouth/Throat: Oropharynx is clear and moist.  Eyes: Conjunctivae normal are normal. Pupils are equal, round, and reactive to light. Right eye exhibits no discharge. Left eye exhibits no discharge.  Neck: Normal range of motion. Neck supple. No JVD present. No tracheal deviation present. No thyromegaly present.  Cardiovascular: Normal rate, regular rhythm and normal heart sounds.   Pulmonary/Chest: No stridor. No respiratory distress. She has wheezes. She has rales.  Abdominal: Soft. Bowel  sounds are normal. She exhibits no distension and no mass. There is no tenderness. There is no rebound and no guarding.  Musculoskeletal: She exhibits no edema and no tenderness.  Lymphadenopathy:    She has no cervical adenopathy.  Neurological: She displays normal reflexes. No cranial nerve deficit. She exhibits normal muscle tone. Coordination normal.  Skin: No rash noted. No erythema.  Psychiatric: Her behavior is normal. Judgment and thought content normal.       depressed   Lab Results  Component Value Date   WBC 9.5 02/15/2012   HGB 12.2 02/15/2012   HCT 35.3* 02/15/2012   PLT 228 02/15/2012   GLUCOSE 212* 03/25/2012   CHOL 167 07/26/2010   TRIG 332.0* 07/26/2010   HDL 43.10 07/26/2010   LDLDIRECT 80.3 07/26/2010   ALT 45* 02/12/2012   AST 42* 02/12/2012   NA 137 03/25/2012   K 3.9 03/25/2012   CL 102 03/25/2012   CREATININE 0.7 03/25/2012   BUN 19 03/25/2012   CO2 24 03/25/2012   TSH 3.68 07/26/2010   HGBA1C 7.2* 03/25/2012   MICROALBUR 0.4 07/26/2010          Assessment & Plan:

## 2012-07-25 ENCOUNTER — Other Ambulatory Visit: Payer: Medicare Other

## 2012-07-25 DIAGNOSIS — IMO0001 Reserved for inherently not codable concepts without codable children: Secondary | ICD-10-CM

## 2012-07-25 DIAGNOSIS — E119 Type 2 diabetes mellitus without complications: Secondary | ICD-10-CM

## 2012-07-25 DIAGNOSIS — A0472 Enterocolitis due to Clostridium difficile, not specified as recurrent: Secondary | ICD-10-CM

## 2012-07-25 DIAGNOSIS — F329 Major depressive disorder, single episode, unspecified: Secondary | ICD-10-CM

## 2012-07-25 DIAGNOSIS — Z23 Encounter for immunization: Secondary | ICD-10-CM

## 2012-07-25 DIAGNOSIS — K589 Irritable bowel syndrome without diarrhea: Secondary | ICD-10-CM

## 2012-07-26 ENCOUNTER — Encounter: Payer: Self-pay | Admitting: Internal Medicine

## 2012-07-26 LAB — CLOSTRIDIUM DIFFICILE EIA: CDIFTX: NEGATIVE

## 2012-07-29 ENCOUNTER — Telehealth: Payer: Self-pay | Admitting: Internal Medicine

## 2012-07-29 NOTE — Telephone Encounter (Signed)
[name], please, inform patient that her stool c diff was negative Thx

## 2012-07-30 NOTE — Telephone Encounter (Signed)
Called pt- no answer/vm. Will try again later.  

## 2012-07-31 NOTE — Telephone Encounter (Signed)
Called patient pper MD//LMOVM to call back

## 2012-07-31 NOTE — Telephone Encounter (Signed)
Pt informed of results.

## 2012-08-20 ENCOUNTER — Encounter: Payer: Self-pay | Admitting: Internal Medicine

## 2012-08-20 ENCOUNTER — Ambulatory Visit (INDEPENDENT_AMBULATORY_CARE_PROVIDER_SITE_OTHER): Payer: 59 | Admitting: Internal Medicine

## 2012-08-20 VITALS — BP 100/76 | HR 80 | Temp 97.6°F | Resp 16 | Wt 245.0 lb

## 2012-08-20 DIAGNOSIS — J45909 Unspecified asthma, uncomplicated: Secondary | ICD-10-CM

## 2012-08-20 DIAGNOSIS — A0472 Enterocolitis due to Clostridium difficile, not specified as recurrent: Secondary | ICD-10-CM

## 2012-08-20 DIAGNOSIS — IMO0001 Reserved for inherently not codable concepts without codable children: Secondary | ICD-10-CM

## 2012-08-20 DIAGNOSIS — J40 Bronchitis, not specified as acute or chronic: Secondary | ICD-10-CM | POA: Insufficient documentation

## 2012-08-20 DIAGNOSIS — E119 Type 2 diabetes mellitus without complications: Secondary | ICD-10-CM

## 2012-08-20 MED ORDER — LEVOFLOXACIN 500 MG PO TABS: 500.0000 mg | ORAL_TABLET | Freq: Every day | ORAL | Status: DC

## 2012-08-20 MED ORDER — METHYLPREDNISOLONE ACETATE 80 MG/ML IJ SUSP
120.0000 mg | Freq: Once | INTRAMUSCULAR | Status: AC
  Administered 2012-08-20: 120 mg via INTRAMUSCULAR

## 2012-08-20 NOTE — Assessment & Plan Note (Signed)
Depomedrol 120 mg im 

## 2012-08-20 NOTE — Assessment & Plan Note (Addendum)
Align 1 qd x 1 mo Risks discussed

## 2012-08-20 NOTE — Progress Notes (Signed)
   Subjective:    Patient ID: [name], female    DOB: Jan 27, 1956, 56 y.o.   MRN: [id number]  Cough This is a new problem. The current episode started in the past 7 days. The problem has been gradually worsening. The problem occurs constantly. The cough is productive of purulent sputum. The treatment provided no relief. Her past medical history is significant for asthma and COPD.     She has chronic depression, anxiety, headaches and chronic moderate fibromyalgia/LBP symptoms controlled with medicines. F/u chronic GI complaints. F/u a repeat case of C diff diarrhea  Wt Readings from Last 3 Encounters:  08/20/12 245 lb (111.131 kg)  04/16/12 246 lb (111.585 kg)  04/12/12 246 lb (111.585 kg)   BP Readings from Last 3 Encounters:  08/20/12 100/76  07/24/12 130/84  04/16/12 122/90     Review of Systems  Constitutional: Positive for fatigue. Negative for activity change, appetite change and unexpected weight change.  HENT: Negative for congestion and mouth sores.   Eyes: Negative for visual disturbance.  Respiratory: Positive for cough. Negative for chest tightness.   Genitourinary: Negative for frequency, difficulty urinating and vaginal pain.  Musculoskeletal: Positive for arthralgias. Negative for gait problem.  Skin: Negative for pallor.  Neurological: Negative for tremors.  Psychiatric/Behavioral: Positive for decreased concentration. Negative for suicidal ideas, confusion and sleep disturbance. The patient is nervous/anxious.        Objective:   Physical Exam  Constitutional: She appears well-developed. No distress.       Obese  HENT:  Head: Normocephalic.  Right Ear: External ear normal.  Left Ear: External ear normal.  Nose: Nose normal.  Mouth/Throat: Oropharynx is clear and moist.  Eyes: Conjunctivae normal are normal. Pupils are equal, round, and reactive to light. Right eye exhibits no discharge. Left eye exhibits no discharge.  Neck: Normal range of  motion. Neck supple. No JVD present. No tracheal deviation present. No thyromegaly present.  Cardiovascular: Normal rate, regular rhythm and normal heart sounds.   Pulmonary/Chest: No stridor. No respiratory distress. She has wheezes. She has rales.  Abdominal: Soft. Bowel sounds are normal. She exhibits no distension and no mass. There is no tenderness. There is no rebound and no guarding.  Musculoskeletal: She exhibits no edema and no tenderness.  Lymphadenopathy:    She has no cervical adenopathy.  Neurological: She displays normal reflexes. No cranial nerve deficit. She exhibits normal muscle tone. Coordination normal.  Skin: No rash noted. No erythema.  Psychiatric: Her behavior is normal. Judgment and thought content normal.       depressed   Lab Results  Component Value Date   WBC 9.5 02/15/2012   HGB 12.2 02/15/2012   HCT 35.3* 02/15/2012   PLT 228 02/15/2012   GLUCOSE 144* 07/24/2012   CHOL 167 07/26/2010   TRIG 332.0* 07/26/2010   HDL 43.10 07/26/2010   LDLDIRECT 80.3 07/26/2010   ALT 45* 02/12/2012   AST 42* 02/12/2012   NA 139 07/24/2012   K 3.8 07/24/2012   CL 105 07/24/2012   CREATININE 0.8 07/24/2012   BUN 13 07/24/2012   CO2 29 07/24/2012   TSH 3.68 07/26/2010   HGBA1C 6.5 07/24/2012   MICROALBUR 0.4 07/26/2010          Assessment & Plan:

## 2012-08-20 NOTE — Assessment & Plan Note (Signed)
Continue with current prescription therapy as reflected on the Med list.  

## 2012-08-20 NOTE — Assessment & Plan Note (Signed)
levaquin x 10  Align x 1 mo

## 2012-08-26 ENCOUNTER — Other Ambulatory Visit: Payer: Self-pay | Admitting: Internal Medicine

## 2012-08-26 NOTE — Telephone Encounter (Signed)
Ok to Rf? 

## 2012-08-29 ENCOUNTER — Other Ambulatory Visit: Payer: Self-pay | Admitting: *Deleted

## 2012-08-29 MED ORDER — PROMETHAZINE HCL 25 MG PO TABS: ORAL_TABLET | ORAL | Status: DC

## 2012-09-03 ENCOUNTER — Other Ambulatory Visit: Payer: Self-pay | Admitting: Internal Medicine

## 2012-09-04 ENCOUNTER — Other Ambulatory Visit: Payer: Self-pay | Admitting: Internal Medicine

## 2012-09-24 ENCOUNTER — Other Ambulatory Visit: Payer: Self-pay | Admitting: Internal Medicine

## 2012-10-28 ENCOUNTER — Encounter: Payer: Self-pay | Admitting: Internal Medicine

## 2012-10-28 ENCOUNTER — Other Ambulatory Visit (INDEPENDENT_AMBULATORY_CARE_PROVIDER_SITE_OTHER): Payer: Medicare Other

## 2012-10-28 ENCOUNTER — Ambulatory Visit (INDEPENDENT_AMBULATORY_CARE_PROVIDER_SITE_OTHER): Payer: Medicare Other | Admitting: Internal Medicine

## 2012-10-28 VITALS — BP 112/80 | HR 80 | Temp 98.7°F | Resp 16 | Wt 242.0 lb

## 2012-10-28 DIAGNOSIS — IMO0001 Reserved for inherently not codable concepts without codable children: Secondary | ICD-10-CM

## 2012-10-28 DIAGNOSIS — K219 Gastro-esophageal reflux disease without esophagitis: Secondary | ICD-10-CM

## 2012-10-28 DIAGNOSIS — E119 Type 2 diabetes mellitus without complications: Secondary | ICD-10-CM

## 2012-10-28 DIAGNOSIS — K589 Irritable bowel syndrome without diarrhea: Secondary | ICD-10-CM

## 2012-10-28 DIAGNOSIS — F3289 Other specified depressive episodes: Secondary | ICD-10-CM

## 2012-10-28 DIAGNOSIS — F329 Major depressive disorder, single episode, unspecified: Secondary | ICD-10-CM

## 2012-10-28 LAB — MAGNESIUM: Magnesium: 1.7 mg/dL (ref 1.5–2.5)

## 2012-10-28 LAB — BASIC METABOLIC PANEL
GFR: 78.81 mL/min (ref >60.00)
Glucose, Bld: 181 mg/dL — ABNORMAL HIGH (ref 70–99)
Potassium: 3.8 mEq/L (ref 3.5–5.1)
Sodium: 137 mEq/L (ref 135–145)

## 2012-10-28 MED ORDER — OMEPRAZOLE 40 MG PO CPDR: 40.0000 mg | DELAYED_RELEASE_CAPSULE | Freq: Every day | ORAL | Status: DC

## 2012-10-28 NOTE — Assessment & Plan Note (Signed)
Continue with current prescription therapy as reflected on the Med list.  

## 2012-10-28 NOTE — Assessment & Plan Note (Signed)
Wt Readings from Last 3 Encounters:  10/28/12 242 lb (109.77 kg)  08/20/12 245 lb (111.131 kg)  04/16/12 246 lb (111.585 kg)

## 2012-10-28 NOTE — Progress Notes (Signed)
   Subjective:    HPI   She has chronic depression, anxiety, headaches and chronic moderate fibromyalgia/LBP symptoms controlled with medicines. F/u chronic GI complaints. F/u a repeat case of C diff diarrhea  Wt Readings from Last 3 Encounters:  10/28/12 242 lb (109.77 kg)  08/20/12 245 lb (111.131 kg)  04/16/12 246 lb (111.585 kg)   BP Readings from Last 3 Encounters:  10/28/12 112/80  08/20/12 100/76  07/24/12 130/84     Review of Systems  Constitutional: Positive for fatigue. Negative for activity change, appetite change and unexpected weight change.  HENT: Negative for congestion and mouth sores.   Eyes: Negative for visual disturbance.  Respiratory: Negative for chest tightness.   Genitourinary: Negative for frequency, difficulty urinating and vaginal pain.  Musculoskeletal: Positive for arthralgias. Negative for gait problem.  Skin: Negative for pallor.  Neurological: Negative for tremors.  Psychiatric/Behavioral: Positive for decreased concentration. Negative for suicidal ideas, confusion and sleep disturbance. The patient is nervous/anxious.        Objective:   Physical Exam  Constitutional: She appears well-developed. No distress.       Obese  HENT:  Head: Normocephalic.  Right Ear: External ear normal.  Left Ear: External ear normal.  Nose: Nose normal.  Mouth/Throat: Oropharynx is clear and moist.  Eyes: Conjunctivae normal are normal. Pupils are equal, round, and reactive to light. Right eye exhibits no discharge. Left eye exhibits no discharge.  Neck: Normal range of motion. Neck supple. No JVD present. No tracheal deviation present. No thyromegaly present.  Cardiovascular: Normal rate, regular rhythm and normal heart sounds.   Pulmonary/Chest: No stridor. No respiratory distress. She has wheezes. She has rales.  Abdominal: Soft. Bowel sounds are normal. She exhibits no distension and no mass. There is no tenderness. There is no rebound and no guarding.   Musculoskeletal: She exhibits no edema and no tenderness.  Lymphadenopathy:    She has no cervical adenopathy.  Neurological: She displays normal reflexes. No cranial nerve deficit. She exhibits normal muscle tone. Coordination normal.  Skin: No rash noted. No erythema.  Psychiatric: Her behavior is normal. Judgment and thought content normal.       depressed   Lab Results  Component Value Date   WBC 9.5 02/15/2012   HGB 12.2 02/15/2012   HCT 35.3* 02/15/2012   PLT 228 02/15/2012   GLUCOSE 144* 07/24/2012   CHOL 167 07/26/2010   TRIG 332.0* 07/26/2010   HDL 43.10 07/26/2010   LDLDIRECT 80.3 07/26/2010   ALT 45* 02/12/2012   AST 42* 02/12/2012   NA 139 07/24/2012   K 3.8 07/24/2012   CL 105 07/24/2012   CREATININE 0.8 07/24/2012   BUN 13 07/24/2012   CO2 29 07/24/2012   TSH 3.68 07/26/2010   HGBA1C 6.5 07/24/2012   MICROALBUR 0.4 07/26/2010          Assessment & Plan:

## 2012-10-29 ENCOUNTER — Other Ambulatory Visit: Payer: Self-pay | Admitting: Internal Medicine

## 2012-10-29 MED ORDER — VITAMIN D 1000 UNITS PO TABS: 2000.0000 [IU] | ORAL_TABLET | Freq: Every day | ORAL | Status: AC

## 2012-11-26 ENCOUNTER — Encounter: Payer: Self-pay | Admitting: Internal Medicine

## 2012-11-26 ENCOUNTER — Ambulatory Visit (INDEPENDENT_AMBULATORY_CARE_PROVIDER_SITE_OTHER): Payer: Medicare Other | Admitting: Internal Medicine

## 2012-11-26 VITALS — BP 120/88 | HR 80 | Temp 97.4°F | Resp 16 | Wt 247.0 lb

## 2012-11-26 DIAGNOSIS — J45909 Unspecified asthma, uncomplicated: Secondary | ICD-10-CM

## 2012-11-26 DIAGNOSIS — E119 Type 2 diabetes mellitus without complications: Secondary | ICD-10-CM

## 2012-11-26 MED ORDER — PREDNISONE 10 MG PO TABS: ORAL_TABLET | ORAL | Status: DC

## 2012-11-26 MED ORDER — METHYLPREDNISOLONE ACETATE 80 MG/ML IJ SUSP
80.0000 mg | Freq: Once | INTRAMUSCULAR | Status: AC
  Administered 2012-11-26: 80 mg via INTRAMUSCULAR

## 2012-11-26 MED ORDER — FUROSEMIDE 20 MG PO TABS: 20.0000 mg | ORAL_TABLET | Freq: Every day | ORAL | Status: AC | PRN

## 2012-11-26 MED ORDER — LEVOFLOXACIN 500 MG PO TABS: 500.0000 mg | ORAL_TABLET | Freq: Every day | ORAL | Status: DC

## 2012-11-26 MED ORDER — FLUTICASONE-SALMETEROL 250-50 MCG/DOSE IN AEPB: 1.0000 | INHALATION_SPRAY | Freq: Two times a day (BID) | RESPIRATORY_TRACT | Status: DC

## 2012-11-26 NOTE — Assessment & Plan Note (Signed)
Wt Readings from Last 3 Encounters:  11/26/12 247 lb (112.038 kg)  10/28/12 242 lb (109.77 kg)  08/20/12 245 lb (111.131 kg)

## 2012-11-26 NOTE — Assessment & Plan Note (Signed)
Continue with current prescription therapy as reflected on the Med list.  

## 2012-11-26 NOTE — Addendum Note (Signed)
Addended by: Merrilyn Puma on: 11/26/2012 12:08 PM   Modules accepted: Orders

## 2012-11-26 NOTE — Progress Notes (Signed)
Subjective:    Shortness of Breath This is a new problem. The current episode started in the past 7 days. The problem has been gradually worsening. Associated symptoms include a sore throat, swollen glands and wheezing. Pertinent negatives include no leg pain. Her past medical history is significant for asthma and COPD.  Cough This is a new problem. The current episode started in the past 7 days. The problem has been gradually worsening. The problem occurs constantly. The cough is productive of purulent sputum. Associated symptoms include a sore throat, shortness of breath and wheezing. The treatment provided no relief. Her past medical history is significant for asthma and COPD.     She has chronic depression, anxiety, headaches and chronic moderate fibromyalgia/LBP symptoms controlled with medicines. F/u chronic GI complaints. F/u a repeat case of C diff diarrhea  Wt Readings from Last 3 Encounters:  11/26/12 247 lb (112.038 kg)  10/28/12 242 lb (109.77 kg)  08/20/12 245 lb (111.131 kg)   BP Readings from Last 3 Encounters:  11/26/12 120/88  10/28/12 112/80  08/20/12 100/76     Review of Systems  Constitutional: Positive for fatigue. Negative for activity change, appetite change and unexpected weight change.  HENT: Positive for sore throat. Negative for congestion and mouth sores.   Eyes: Negative for visual disturbance.  Respiratory: Positive for cough, shortness of breath and wheezing. Negative for chest tightness.   Genitourinary: Negative for frequency, difficulty urinating and vaginal pain.  Musculoskeletal: Positive for arthralgias. Negative for gait problem.  Skin: Negative for pallor.  Neurological: Negative for tremors.  Psychiatric/Behavioral: Positive for decreased concentration. Negative for suicidal ideas, confusion and sleep disturbance. The patient is nervous/anxious.        Objective:   Physical Exam  Constitutional: She appears well-developed. No  distress.  Obese  HENT:  Head: Normocephalic.  Right Ear: External ear normal.  Left Ear: External ear normal.  Nose: Nose normal.  Mouth/Throat: Oropharynx is clear and moist.  Eyes: Conjunctivae are normal. Pupils are equal, round, and reactive to light. Right eye exhibits no discharge. Left eye exhibits no discharge.  Neck: Normal range of motion. Neck supple. No JVD present. No tracheal deviation present. No thyromegaly present.  Cardiovascular: Normal rate, regular rhythm and normal heart sounds.   Pulmonary/Chest: No stridor. No respiratory distress. She has wheezes. She has rales.  Abdominal: Soft. Bowel sounds are normal. She exhibits no distension and no mass. There is no tenderness. There is no rebound and no guarding.  Musculoskeletal: She exhibits no edema and no tenderness.  Lymphadenopathy:    She has no cervical adenopathy.  Neurological: She displays normal reflexes. No cranial nerve deficit. She exhibits normal muscle tone. Coordination normal.  Skin: No rash noted. No erythema.  Psychiatric: Her behavior is normal. Judgment and thought content normal.  depressed   Lab Results  Component Value Date   WBC 9.5 02/15/2012   HGB 12.2 02/15/2012   HCT 35.3* 02/15/2012   PLT 228 02/15/2012   GLUCOSE 181* 10/28/2012   CHOL 167 07/26/2010   TRIG 332.0* 07/26/2010   HDL 43.10 07/26/2010   LDLDIRECT 80.3 07/26/2010   ALT 45* 02/12/2012   AST 42* 02/12/2012   NA 137 10/28/2012   K 3.8 10/28/2012   CL 104 10/28/2012   CREATININE 0.8 10/28/2012   BUN 16 10/28/2012   CO2 24 10/28/2012   TSH 3.68 07/26/2010   HGBA1C 6.5 07/24/2012   MICROALBUR 0.4 07/26/2010  Assessment & Plan:

## 2012-11-26 NOTE — Assessment & Plan Note (Signed)
Exacerated - Levaquin if worse Depo medrol 80 mg im Prednisone 10 mg: take 4 tabs a day x 3 days; then 3 tabs a day x 4 days; then 2 tabs a day x 4 days, then 1 tab a day x 6 days, then stop. Take pc.

## 2012-12-02 ENCOUNTER — Other Ambulatory Visit: Payer: Self-pay | Admitting: Internal Medicine

## 2012-12-09 ENCOUNTER — Other Ambulatory Visit: Payer: Self-pay | Admitting: Internal Medicine

## 2012-12-25 ENCOUNTER — Telehealth: Payer: Self-pay

## 2012-12-25 ENCOUNTER — Other Ambulatory Visit: Payer: Self-pay | Admitting: Internal Medicine

## 2012-12-25 NOTE — Telephone Encounter (Signed)
Pharmacy called to inform MD that Hydrocodone is no longer manufactured with strengths of tylenol higher than 325. Pharmacy is requesting new Rx for 10/325, please advise

## 2012-12-25 NOTE — Telephone Encounter (Signed)
Ok 10/325 same directions  Thx

## 2012-12-26 ENCOUNTER — Other Ambulatory Visit: Payer: Self-pay | Admitting: *Deleted

## 2012-12-26 ENCOUNTER — Telehealth: Payer: Self-pay | Admitting: *Deleted

## 2012-12-26 MED ORDER — HYDROCODONE-ACETAMINOPHEN 10-325 MG PO TABS: 1.0000 | ORAL_TABLET | Freq: Four times a day (QID) | ORAL | Status: DC | PRN

## 2012-12-26 MED ORDER — ALBUTEROL SULFATE HFA 108 (90 BASE) MCG/ACT IN AERS: 2.0000 | INHALATION_SPRAY | Freq: Four times a day (QID) | RESPIRATORY_TRACT | Status: DC | PRN

## 2012-12-26 NOTE — Telephone Encounter (Signed)
Notified [facility] spoke with Zollie Scale gave md response. Updated epic...Raechel Chute

## 2012-12-26 NOTE — Telephone Encounter (Signed)
Left msg on triage requesting status pain medication, Pharmacy states they have fax md haven't received call bck...Raechel Chute

## 2012-12-26 NOTE — Telephone Encounter (Signed)
Notified pt rx was call into pharmacy this am...lmb

## 2012-12-31 ENCOUNTER — Encounter: Payer: Self-pay | Admitting: Internal Medicine

## 2012-12-31 ENCOUNTER — Ambulatory Visit (INDEPENDENT_AMBULATORY_CARE_PROVIDER_SITE_OTHER): Payer: Medicare Other | Admitting: Internal Medicine

## 2012-12-31 VITALS — BP 120/82 | HR 86 | Temp 97.8°F | Resp 16

## 2012-12-31 DIAGNOSIS — J40 Bronchitis, not specified as acute or chronic: Secondary | ICD-10-CM

## 2012-12-31 DIAGNOSIS — E119 Type 2 diabetes mellitus without complications: Secondary | ICD-10-CM

## 2012-12-31 DIAGNOSIS — J45909 Unspecified asthma, uncomplicated: Secondary | ICD-10-CM

## 2012-12-31 MED ORDER — LEVOFLOXACIN 500 MG PO TABS: 500.0000 mg | ORAL_TABLET | Freq: Every day | ORAL | Status: DC

## 2012-12-31 MED ORDER — PROMETHAZINE-CODEINE 6.25-10 MG/5ML PO SYRP: 5.0000 mL | ORAL_SOLUTION | ORAL | Status: DC | PRN

## 2012-12-31 MED ORDER — METHYLPREDNISOLONE ACETATE 40 MG/ML IJ SUSP
80.0000 mg | Freq: Once | INTRAMUSCULAR | Status: AC
  Administered 2012-12-31: 80 mg via INTRAMUSCULAR

## 2012-12-31 MED ORDER — PREDNISONE 10 MG PO TABS: ORAL_TABLET | ORAL | Status: DC

## 2012-12-31 NOTE — Progress Notes (Signed)
Patient ID: [name], female   DOB: 1956-03-21, 57 y.o.   MRN: [id number]   Subjective:    Cough This is a new problem. The current episode started in the past 7 days. The problem has been gradually worsening. The problem occurs constantly. The cough is productive of purulent sputum. Associated symptoms include a sore throat, shortness of breath and wheezing. The treatment provided no relief. Her past medical history is significant for asthma and COPD.  Sore Throat  Associated symptoms include coughing, shortness of breath and swollen glands. Pertinent negatives include no congestion.  Shortness of Breath This is a new problem. The current episode started in the past 7 days. The problem has been gradually worsening. Associated symptoms include a sore throat, swollen glands and wheezing. Pertinent negatives include no leg pain. Her past medical history is significant for asthma and COPD.  Yellow mucu. She took 40 mg prednisone x 1 d - not better   She has chronic depression, anxiety, headaches and chronic moderate fibromyalgia/LBP symptoms controlled with medicines. F/u chronic GI complaints. F/u a repeat case of C diff diarrhea  Wt Readings from Last 3 Encounters:  11/26/12 247 lb (112.038 kg)  10/28/12 242 lb (109.77 kg)  08/20/12 245 lb (111.131 kg)   BP Readings from Last 3 Encounters:  12/31/12 120/82  11/26/12 120/88  10/28/12 112/80     Review of Systems  Constitutional: Positive for fatigue. Negative for activity change, appetite change and unexpected weight change.  HENT: Positive for sore throat. Negative for congestion and mouth sores.   Eyes: Negative for visual disturbance.  Respiratory: Positive for cough, shortness of breath and wheezing. Negative for chest tightness.   Genitourinary: Negative for frequency, difficulty urinating and vaginal pain.  Musculoskeletal: Positive for arthralgias. Negative for gait problem.  Skin: Negative for pallor.  Neurological:  Negative for tremors.  Psychiatric/Behavioral: Positive for decreased concentration. Negative for suicidal ideas, confusion and sleep disturbance. The patient is nervous/anxious.        Objective:   Physical Exam  Constitutional: She appears well-developed. No distress.  Obese  HENT:  Head: Normocephalic.  Right Ear: External ear normal.  Left Ear: External ear normal.  Nose: Nose normal.  Mouth/Throat: Oropharynx is clear and moist.  Eyes: Conjunctivae are normal. Pupils are equal, round, and reactive to light. Right eye exhibits no discharge. Left eye exhibits no discharge.  Neck: Normal range of motion. Neck supple. No JVD present. No tracheal deviation present. No thyromegaly present.  Cardiovascular: Normal rate, regular rhythm and normal heart sounds.   Pulmonary/Chest: No stridor. No respiratory distress. She has wheezes. She has rales.  Abdominal: Soft. Bowel sounds are normal. She exhibits no distension and no mass. There is no tenderness. There is no rebound and no guarding.  Musculoskeletal: She exhibits no edema and no tenderness.  Lymphadenopathy:    She has no cervical adenopathy.  Neurological: She displays normal reflexes. No cranial nerve deficit. She exhibits normal muscle tone. Coordination normal.  Skin: No rash noted. No erythema.  Psychiatric: Her behavior is normal. Judgment and thought content normal.  depressed  Tearful  Lab Results  Component Value Date   WBC 9.5 02/15/2012   HGB 12.2 02/15/2012   HCT 35.3* 02/15/2012   PLT 228 02/15/2012   GLUCOSE 181* 10/28/2012   CHOL 167 07/26/2010   TRIG 332.0* 07/26/2010   HDL 43.10 07/26/2010   LDLDIRECT 80.3 07/26/2010   ALT 45* 02/12/2012   AST 42* 02/12/2012   NA 137  10/28/2012   K 3.8 10/28/2012   CL 104 10/28/2012   CREATININE 0.8 10/28/2012   BUN 16 10/28/2012   CO2 24 10/28/2012   TSH 3.68 07/26/2010   HGBA1C 6.5 07/24/2012   MICROALBUR 0.4 07/26/2010          Assessment & Plan:

## 2012-12-31 NOTE — Assessment & Plan Note (Signed)
Prednisone 10 mg: take 4 tabs a day x 3 days; then 3 tabs a day x 4 days; then 2 tabs a day x 4 days, then 1 tab a day x 6 days, then stop. Take pc. Cont other meds

## 2012-12-31 NOTE — Patient Instructions (Addendum)
You can try Symbicort 2 puffs twice a day in place of Advair

## 2012-12-31 NOTE — Assessment & Plan Note (Signed)
Continue with current prescription therapy as reflected on the Med list.  

## 2012-12-31 NOTE — Assessment & Plan Note (Signed)
Po ABX Levaquin

## 2013-01-03 ENCOUNTER — Other Ambulatory Visit: Payer: Self-pay | Admitting: *Deleted

## 2013-01-03 NOTE — Telephone Encounter (Signed)
Received PA back med has been approve. Notified [facility] spoke with Olegario Messier gave approval status...Raechel Chute

## 2013-01-03 NOTE — Telephone Encounter (Signed)
Received fax pt needing PA for  Ventolin inhaler. Notified insurance fax over PA form has been completed and fax back. Waiting on approval status...Raechel Chute

## 2013-01-17 ENCOUNTER — Other Ambulatory Visit: Payer: Self-pay | Admitting: Internal Medicine

## 2013-01-27 ENCOUNTER — Ambulatory Visit: Payer: Medicare Other | Admitting: Internal Medicine

## 2013-02-12 ENCOUNTER — Other Ambulatory Visit: Payer: Self-pay

## 2013-02-12 NOTE — Telephone Encounter (Signed)
Pt called stating she has been experiencing dysuria, frequency with malodorous cloudy urine. Pt is requesting is Rx for an ABX, please advise

## 2013-02-12 NOTE — Telephone Encounter (Signed)
UA and cx OK Levaquin OV if not better Thx

## 2013-02-13 ENCOUNTER — Other Ambulatory Visit (INDEPENDENT_AMBULATORY_CARE_PROVIDER_SITE_OTHER): Payer: Medicare Other

## 2013-02-13 ENCOUNTER — Encounter: Payer: Self-pay | Admitting: Internal Medicine

## 2013-02-13 ENCOUNTER — Ambulatory Visit (INDEPENDENT_AMBULATORY_CARE_PROVIDER_SITE_OTHER): Payer: Medicare Other | Admitting: Internal Medicine

## 2013-02-13 VITALS — BP 130/82 | HR 76 | Temp 97.5°F

## 2013-02-13 DIAGNOSIS — R109 Unspecified abdominal pain: Secondary | ICD-10-CM

## 2013-02-13 DIAGNOSIS — IMO0001 Reserved for inherently not codable concepts without codable children: Secondary | ICD-10-CM

## 2013-02-13 DIAGNOSIS — R3915 Urgency of urination: Secondary | ICD-10-CM

## 2013-02-13 DIAGNOSIS — R350 Frequency of micturition: Secondary | ICD-10-CM

## 2013-02-13 DIAGNOSIS — R300 Dysuria: Secondary | ICD-10-CM

## 2013-02-13 LAB — POCT URINALYSIS DIPSTICK
Bilirubin, UA: NEGATIVE
Leukocytes, UA: NEGATIVE
Spec Grav, UA: 1.02

## 2013-02-13 LAB — URINALYSIS, ROUTINE W REFLEX MICROSCOPIC
Bilirubin Urine: NEGATIVE
Nitrite: POSITIVE
Specific Gravity, Urine: 1.02 (ref 1.000–1.030)
Total Protein, Urine: 30
pH: 5 (ref 5.0–8.0)

## 2013-02-13 MED ORDER — NITROFURANTOIN MONOHYD MACRO 100 MG PO CAPS: 100.0000 mg | ORAL_CAPSULE | Freq: Two times a day (BID) | ORAL | Status: DC

## 2013-02-13 MED ORDER — DICYCLOMINE HCL 20 MG PO TABS: 20.0000 mg | ORAL_TABLET | Freq: Three times a day (TID) | ORAL | Status: DC | PRN

## 2013-02-13 NOTE — Patient Instructions (Signed)
Urinary Tract Infection  Urinary tract infections (UTIs) can develop anywhere along your urinary tract. Your urinary tract is your body's drainage system for removing wastes and extra water. Your urinary tract includes two kidneys, two ureters, a bladder, and a urethra. Your kidneys are a pair of bean-shaped organs. Each kidney is about the size of your fist. They are located below your ribs, one on each side of your spine.  CAUSES  Infections are caused by microbes, which are microscopic organisms, including fungi, viruses, and bacteria. These organisms are so small that they can only be seen through a microscope. Bacteria are the microbes that most commonly cause UTIs.  SYMPTOMS   Symptoms of UTIs may vary by age and gender of the patient and by the location of the infection. Symptoms in young women typically include a frequent and intense urge to urinate and a painful, burning feeling in the bladder or urethra during urination. Older women and men are more likely to be tired, shaky, and weak and have muscle aches and abdominal pain. A fever may mean the infection is in your kidneys. Other symptoms of a kidney infection include pain in your back or sides below the ribs, nausea, and vomiting.  DIAGNOSIS  To diagnose a UTI, your caregiver will ask you about your symptoms. Your caregiver also will ask to provide a urine sample. The urine sample will be tested for bacteria and white blood cells. White blood cells are made by your body to help fight infection.  TREATMENT   Typically, UTIs can be treated with medication. Because most UTIs are caused by a bacterial infection, they usually can be treated with the use of antibiotics. The choice of antibiotic and length of treatment depend on your symptoms and the type of bacteria causing your infection.  HOME CARE INSTRUCTIONS   If you were prescribed antibiotics, take them exactly as your caregiver instructs you. Finish the medication even if you feel better after you  have only taken some of the medication.   Drink enough water and fluids to keep your urine clear or pale yellow.   Avoid caffeine, tea, and carbonated beverages. They tend to irritate your bladder.   Empty your bladder often. Avoid holding urine for long periods of time.   Empty your bladder before and after sexual intercourse.   After a bowel movement, women should cleanse from front to back. Use each tissue only once.  SEEK MEDICAL CARE IF:    You have back pain.   You develop a fever.   Your symptoms do not begin to resolve within 3 days.  SEEK IMMEDIATE MEDICAL CARE IF:    You have severe back pain or lower abdominal pain.   You develop chills.   You have nausea or vomiting.   You have continued burning or discomfort with urination.  MAKE SURE YOU:    Understand these instructions.   Will watch your condition.   Will get help right away if you are not doing well or get worse.  Document Released: 06/21/2005 Document Revised: 03/12/2012 Document Reviewed: 10/20/2011  [employer] Patient Information 2014 [employer]

## 2013-02-13 NOTE — Addendum Note (Signed)
Addended by: Brenton Grills C on: 02/13/2013 10:04 AM   Modules accepted: Orders

## 2013-02-13 NOTE — Progress Notes (Signed)
HPI  Pt presents to the clinic today with c/o urinary  Urgency, frequency, dysuria a flank pain x 4 days. She thinks she may have a UTI. She has been increased her fluid intake and decreased her caffeine intake. She has also been taking Azo OTC which has also helped. She denies fever, chills. She has had fatigue and body aches. She has not seen any blood in her urine. She denies vaginal discharge, vaginal bleeding or pain.   Review of Systems  Past Medical History  Diagnosis Date  . Asthma   . COPD (chronic obstructive pulmonary disease)   . Depression   . GERD (gastroesophageal reflux disease)     Dr Juanda Chance  . Gastroparesis   . Migraines   . Menopause   . IBS (irritable bowel syndrome)   . Diabetes mellitus type II   . Peripheral neuropathy   . Fibromyalgia   . Obesity   . Barrett's esophagus   . MRSA (methicillin resistant Staphylococcus aureus)     possible (by verbal report from pt)  . History of hiatal hernia   . OSA (obstructive sleep apnea)     pt denies 02/13/12, 02/20/12  . C. difficile colitis 01/2012 hosp    Family History  Problem Relation Age of Onset  . Diabetes Mother   . Lung cancer Mother   . Cancer Mother     lung  . Esophageal cancer Father   . Heart disease Father   . Cancer Father     esophageal  . Diabetes Father   . Colon polyps Father     Family History  . Breast cancer Maternal Grandmother   . Heart disease Maternal Grandmother   . Diabetes Maternal Grandmother   . Cancer Maternal Grandmother     breast, colon  . Heart attack Brother 55  . Colon cancer Maternal Grandfather   . Colon polyps Maternal Grandfather   . Diabetes Maternal Grandfather   . Colon polyps Paternal Grandfather   . Colon cancer Paternal Grandfather   . Breast cancer Maternal Aunt   . Arthritis    . Hypertension    . Uterine cancer Other     great grandmother    History   Social History  . Marital Status: Married    Spouse Name: N/A    Number of Children: 3   . Years of Education: N/A   Occupational History  . CMA disabled    Social History Main Topics  . Smoking status: Former Smoker -- 1.00 packs/day for 20 years    Quit date: 12/24/2009  . Smokeless tobacco: Not on file     Comment: Stopped April 2011  . Alcohol Use: No     Comment: occasional (1-2 times per month)  . Drug Use: No  . Sexually Active: Yes   Other Topics Concern  . Not on file   Social History Narrative  . No narrative on file    Allergies  Allergen Reactions  . Droperidol     twitching  . Amoxicillin-Pot Clavulanate     REACTION: diarrhea  . Ciprofloxacin     Sensitivity.   . Duloxetine     REACTION: diarrhea  . Hydromorphone Hcl   . Iohexol      Code: HIVES, Desc: PT developed 3 hives post injection of Omni 300. Observed by Dr Clifton James. Given 50 mg PO benedryl.Hives subsided before being released., Onset Date: [id number]   . Ketorolac Tromethamine   . Moxifloxacin   . Penicillins  sensitivity  . Pioglitazone     REACTION: weight gain  . Pregabalin     REACTION: anaphylaxis  . Pristiq (Desvenlafaxine Succinate Monohydrate)     abd pains  . Quetiapine     REACTION: nightmares  . Topiramate     Constitutional: Denies fever, malaise, fatigue, headache or abrupt weight changes.   GU: Pt reports urgency, frequency and pain with urination. Denies burning sensation, blood in urine, odor or discharge. Skin: Denies redness, rashes, lesions or ulcercations.   No other specific complaints in a complete review of systems (except as listed in HPI above).    Objective:   Physical Exam  BP 130/82  Pulse 76  Temp(Src) 97.5 F (36.4 C) (Oral)  SpO2 95% Wt Readings from Last 3 Encounters:  11/26/12 247 lb (112.038 kg)  10/28/12 242 lb (109.77 kg)  08/20/12 245 lb (111.131 kg)    General: Appears her stated age, well developed, well nourished in NAD. Cardiovascular: Normal rate and rhythm. S1,S2 noted.  No murmur, rubs or gallops noted. No  JVD or BLE edema. No carotid bruits noted. Pulmonary/Chest: Normal effort and positive vesicular breath sounds. No respiratory distress. No wheezes, rales or ronchi noted.  Abdomen: Soft and nontender. Normal bowel sounds, no bruits noted. No distention or masses noted. Liver, spleen and kidneys non palpable. Tender to palpation over the bladder area. No CVA tenderness.      Assessment & Plan:   Urinary urgency, frequency, dysuria and flank pain secondary to possible UTI:  Urinalysis and urine culture eRx sent if for Macrobid 100 mg BID x 5 days eRx sent in for Pyridium 200 mg TID prn Drink plenty of fluids  RTC as needed or if symptoms persist.

## 2013-02-13 NOTE — Telephone Encounter (Signed)
Pt had appointment with Nicki Reaper NP-C 02/13/2013

## 2013-02-19 ENCOUNTER — Telehealth: Payer: Self-pay | Admitting: Internal Medicine

## 2013-02-19 NOTE — Telephone Encounter (Signed)
Pt informed of results and NP's advisement. Pt states that she is better, she wants to know if it's okay to continue taking AZO sxs. She is not having burning anymore, but is still having spasms.

## 2013-02-19 NOTE — Telephone Encounter (Signed)
Urinalysis revealed UTI, culture is not back yet, we will call her when the results come back to see if we need to change the abx

## 2013-02-19 NOTE — Telephone Encounter (Signed)
Pt requesting results of UA and culture done last week-pt is still having urinary issues.

## 2013-02-20 NOTE — Telephone Encounter (Signed)
Pt informed of NP's advisement.  

## 2013-02-20 NOTE — Telephone Encounter (Signed)
Ok to continue azo as needed, try cranberry juice for spasms

## 2013-02-21 ENCOUNTER — Other Ambulatory Visit: Payer: Self-pay | Admitting: *Deleted

## 2013-02-21 MED ORDER — IPRATROPIUM-ALBUTEROL 0.5-2.5 (3) MG/3ML IN SOLN: 3.0000 mL | Freq: Four times a day (QID) | RESPIRATORY_TRACT | Status: DC | PRN

## 2013-02-24 ENCOUNTER — Ambulatory Visit (INDEPENDENT_AMBULATORY_CARE_PROVIDER_SITE_OTHER): Payer: Medicare Other | Admitting: Internal Medicine

## 2013-02-24 ENCOUNTER — Encounter: Payer: Self-pay | Admitting: Internal Medicine

## 2013-02-24 DIAGNOSIS — N390 Urinary tract infection, site not specified: Secondary | ICD-10-CM | POA: Insufficient documentation

## 2013-02-24 DIAGNOSIS — J40 Bronchitis, not specified as acute or chronic: Secondary | ICD-10-CM

## 2013-02-24 DIAGNOSIS — R197 Diarrhea, unspecified: Secondary | ICD-10-CM

## 2013-02-24 DIAGNOSIS — E119 Type 2 diabetes mellitus without complications: Secondary | ICD-10-CM

## 2013-02-24 MED ORDER — LEVOFLOXACIN 500 MG PO TABS: 500.0000 mg | ORAL_TABLET | Freq: Every day | ORAL | Status: DC

## 2013-02-24 MED ORDER — HYDROCODONE-ACETAMINOPHEN 10-325 MG PO TABS: 1.0000 | ORAL_TABLET | Freq: Four times a day (QID) | ORAL | Status: DC | PRN

## 2013-02-24 NOTE — Assessment & Plan Note (Signed)
Recurent

## 2013-02-24 NOTE — Assessment & Plan Note (Signed)
Ok now

## 2013-02-24 NOTE — Progress Notes (Signed)
   Subjective:    Cough This is a new problem. The current episode started in the past 7 days. The problem has been gradually worsening. The problem occurs constantly. The cough is productive of purulent sputum. The treatment provided no relief.  Sore Throat  This is a new problem. The current episode started in the past 7 days. Associated symptoms include coughing. Pertinent negatives include no congestion. She has had no exposure to strep. The treatment provided mild relief.  Yellow mucus.   UTI is better   She has chronic depression, anxiety, headaches and chronic moderate fibromyalgia/LBP symptoms controlled with medicines. F/u chronic GI complaints.   Wt Readings from Last 3 Encounters:  02/24/13 240 lb 4 oz (108.977 kg)  11/26/12 247 lb (112.038 kg)  10/28/12 242 lb (109.77 kg)   BP Readings from Last 3 Encounters:  02/24/13 110/78  02/13/13 130/82  12/31/12 120/82     Review of Systems  Constitutional: Positive for fatigue. Negative for activity change, appetite change and unexpected weight change.  HENT: Negative for congestion and mouth sores.   Eyes: Negative for visual disturbance.  Respiratory: Positive for cough. Negative for chest tightness.   Genitourinary: Negative for frequency, difficulty urinating and vaginal pain.  Musculoskeletal: Positive for arthralgias. Negative for gait problem.  Skin: Negative for pallor.  Neurological: Negative for tremors.  Psychiatric/Behavioral: Positive for decreased concentration. Negative for suicidal ideas, confusion and sleep disturbance. The patient is nervous/anxious.        Objective:   Physical Exam  Constitutional: She appears well-developed. No distress.  Obese  HENT:  Head: Normocephalic.  Right Ear: External ear normal.  Left Ear: External ear normal.  Nose: Nose normal.  Mouth/Throat: Oropharynx is clear and moist.  Eyes: Conjunctivae are normal. Pupils are equal, round, and reactive to light. Right eye  exhibits no discharge. Left eye exhibits no discharge.  Neck: Normal range of motion. Neck supple. No JVD present. No tracheal deviation present. No thyromegaly present.  Cardiovascular: Normal rate, regular rhythm and normal heart sounds.   Pulmonary/Chest: No stridor. No respiratory distress. She has wheezes. She has rales.  Abdominal: Soft. Bowel sounds are normal. She exhibits no distension and no mass. There is no tenderness. There is no rebound and no guarding.  Musculoskeletal: She exhibits no edema and no tenderness.  Lymphadenopathy:    She has no cervical adenopathy.  Neurological: She displays normal reflexes. No cranial nerve deficit. She exhibits normal muscle tone. Coordination normal.  Skin: No rash noted. No erythema.  Psychiatric: Her behavior is normal. Judgment and thought content normal.  depressed  Not tearful eryth throat  Lab Results  Component Value Date   WBC 9.5 02/15/2012   HGB 12.2 02/15/2012   HCT 35.3* 02/15/2012   PLT 228 02/15/2012   GLUCOSE 181* 10/28/2012   CHOL 167 07/26/2010   TRIG 332.0* 07/26/2010   HDL 43.10 07/26/2010   LDLDIRECT 80.3 07/26/2010   ALT 45* 02/12/2012   AST 42* 02/12/2012   NA 137 10/28/2012   K 3.8 10/28/2012   CL 104 10/28/2012   CREATININE 0.8 10/28/2012   BUN 16 10/28/2012   CO2 24 10/28/2012   TSH 3.68 07/26/2010   HGBA1C 6.5 07/24/2012   MICROALBUR 0.4 07/26/2010          Assessment & Plan:

## 2013-02-24 NOTE — Assessment & Plan Note (Signed)
Wt Readings from Last 3 Encounters:  02/24/13 240 lb 4 oz (108.977 kg)  11/26/12 247 lb (112.038 kg)  10/28/12 242 lb (109.77 kg)

## 2013-02-24 NOTE — Assessment & Plan Note (Signed)
Continue with current prescription therapy as reflected on the Med list.  

## 2013-02-24 NOTE — Assessment & Plan Note (Signed)
Levaquin Probiotic

## 2013-03-29 ENCOUNTER — Other Ambulatory Visit: Payer: Self-pay | Admitting: Internal Medicine

## 2013-04-08 ENCOUNTER — Encounter: Payer: Self-pay | Admitting: Internal Medicine

## 2013-04-08 ENCOUNTER — Ambulatory Visit (INDEPENDENT_AMBULATORY_CARE_PROVIDER_SITE_OTHER): Payer: Medicare Other | Admitting: Internal Medicine

## 2013-04-08 VITALS — BP 128/84 | HR 89 | Temp 98.0°F | Ht 65.0 in | Wt 242.0 lb

## 2013-04-08 DIAGNOSIS — J441 Chronic obstructive pulmonary disease with (acute) exacerbation: Secondary | ICD-10-CM

## 2013-04-08 MED ORDER — LEVOFLOXACIN 500 MG PO TABS: 500.0000 mg | ORAL_TABLET | Freq: Every day | ORAL | Status: DC

## 2013-04-08 MED ORDER — PREDNISONE 10 MG PO TABS: ORAL_TABLET | ORAL | Status: DC

## 2013-04-08 NOTE — Progress Notes (Signed)
HPI  Pt presents to the clinic today with c/o cold symptoms x 2 weeks. She does have a thick productive cough of tan sputum. She has heard some wheezing. She has been using her nebulizer, albuterol, dulera, cough syrup with codeine, allergy medicine, non of which has helped. She denies fever, chills or body aches. The thing that concerns her the most is a stabbing pain in her right upper back. It seems to hurt worse when she breaths. She does have a history of asthma and COPD. She has had sick contacts.  Review of Systems      Past Medical History  Diagnosis Date  . Asthma   . COPD (chronic obstructive pulmonary disease)   . Depression   . GERD (gastroesophageal reflux disease)     Dr Juanda Chance  . Gastroparesis   . Migraines   . Menopause   . IBS (irritable bowel syndrome)   . Diabetes mellitus type II   . Peripheral neuropathy   . Fibromyalgia   . Obesity   . Barrett's esophagus   . MRSA (methicillin resistant Staphylococcus aureus)     possible (by verbal report from pt)  . History of hiatal hernia   . OSA (obstructive sleep apnea)     pt denies 02/13/12, 02/20/12  . C. difficile colitis 01/2012 hosp    Family History  Problem Relation Age of Onset  . Diabetes Mother   . Lung cancer Mother   . Cancer Mother     lung  . Esophageal cancer Father   . Heart disease Father   . Cancer Father     esophageal  . Diabetes Father   . Colon polyps Father     Family History  . Breast cancer Maternal Grandmother   . Heart disease Maternal Grandmother   . Diabetes Maternal Grandmother   . Cancer Maternal Grandmother     breast, colon  . Heart attack Brother 55  . Colon cancer Maternal Grandfather   . Colon polyps Maternal Grandfather   . Diabetes Maternal Grandfather   . Colon polyps Paternal Grandfather   . Colon cancer Paternal Grandfather   . Breast cancer Maternal Aunt   . Arthritis    . Hypertension    . Uterine cancer Other     great grandmother    History   Social  History  . Marital Status: Married    Spouse Name: N/A    Number of Children: 3  . Years of Education: N/A   Occupational History  . CMA disabled    Social History Main Topics  . Smoking status: Former Smoker -- 1.00 packs/day for 20 years    Quit date: 12/24/2009  . Smokeless tobacco: Not on file     Comment: Stopped April 2011  . Alcohol Use: No     Comment: occasional (1-2 times per month)  . Drug Use: No  . Sexually Active: Yes   Other Topics Concern  . Not on file   Social History Narrative  . No narrative on file    Allergies  Allergen Reactions  . Droperidol     twitching  . Amoxicillin-Pot Clavulanate     REACTION: diarrhea  . Ciprofloxacin     Sensitivity.   . Duloxetine     REACTION: diarrhea  . Hydromorphone Hcl   . Iohexol      Code: HIVES, Desc: PT developed 3 hives post injection of Omni 300. Observed by Dr Clifton James. Given 50 mg PO benedryl.Hives subsided before  being released., Onset Date: [id number]   . Ketorolac Tromethamine   . Moxifloxacin   . Penicillins     sensitivity  . Pioglitazone     REACTION: weight gain  . Pregabalin     REACTION: anaphylaxis  . Pristiq (Desvenlafaxine Succinate Monohydrate)     abd pains  . Quetiapine     REACTION: nightmares  . Topiramate      Constitutional: Positive headache, fatigue. Denies fever or abrupt weight changes.  HEENT:  Positive sore throat. Denies eye redness, eye pain, pressure behind the eyes, facial pain, nasal congestion, ear pain, ringing in the ears, wax buildup, runny nose or bloody nose. Respiratory: Positive cough and sputum production. Denies difficulty breathing or shortness of breath.  Cardiovascular: Denies chest pain, chest tightness, palpitations or swelling in the hands or feet.   No other specific complaints in a complete review of systems (except as listed in HPI above).  Objective:   BP 128/84  Pulse 89  Temp(Src) 98 F (36.7 C) (Oral)  Ht 5\' 5"  (1.651 m)  Wt 242  lb (109.77 kg)  BMI 40.27 kg/m2  SpO2 96% Wt Readings from Last 3 Encounters:  04/08/13 242 lb (109.77 kg)  02/24/13 240 lb 4 oz (108.977 kg)  11/26/12 247 lb (112.038 kg)     General: Appears her stated age, well developed, well nourished in NAD. HEENT: Head: normal shape and size; Eyes: sclera white, no icterus, conjunctiva pink, PERRLA and EOMs intact; Ears: Tm's gray and intact, normal light reflex; Nose: mucosa pink and moist, septum midline; Throat/Mouth: + PND. Teeth present, mucosa erythematous and moist, no exudate noted, no lesions or ulcerations noted.  Neck: Mild cervical lymphadenopathy. Neck supple, trachea midline. No massses, lumps or thyromegaly present.  Cardiovascular: Normal rate and rhythm. S1,S2 noted.  No murmur, rubs or gallops noted. No JVD or BLE edema. No carotid bruits noted. Pulmonary/Chest: Normal effort and positive vesicular breath sounds. No respiratory distress. No wheezes, rales or ronchi noted.      Assessment & Plan:   COPD exacerbation, new onset:  Get some rest and drink plenty of water Do salt water gargles for the sore throat eRx for pred taper eRx for Levaquin x 7 days    RTC as needed or if symptoms persist.

## 2013-04-08 NOTE — Patient Instructions (Signed)
Chronic Obstructive Pulmonary Disease Exacerbation  Chronic obstructive pulmonary disease (COPD) is a condition that limits airflow. COPD may include chronic bronchitis, pulmonary emphysema, or both. COPD exacerbation means that your COPD has gotten worse. Without treatment, this can be a life-threatening problem. COPD exacerbation requires immediate medical care.  CAUSES   COPD exacerbation can be caused by:   Exposure to smoke.   Exposure to air pollution, chemical fumes, or dust.   Respiratory infections.   Genetics, particularly alpha 1-antitrypsin deficiency.   A condition in which the body's immune system attacks itself (autoimmunity).  SYMPTOMS    Increased coughing.   Increased wheezing.   Increased shortness of breath.   Swelling due to a buildup of fluid (peripheral edema) related to heart strain.   Rapid breathing.   Chest enlargement (barrel chest).   Chest tightness.  DIAGNOSIS   There is no single test that can diagnosis COPD exacerbation. Your history, physical exam, and other tests will help your caregiver make a diagnosis. Tests may include a chest X-ray, pulmonary function tests, spirometry, basic lab tests, and an arterial blood gas test.  TREATMENT   Severe problems may require a stay in the hospital. Depending on the cause of your problems, the following may be prescribed:   Antibiotic medicines.   Bronchodilators (inhaled or tablets).   Cortisone medicines (inhaled or tablets).   Supplemental oxygen therapy.   Pulmonary rehabilitation. This is a broad program that may involve exercise, nutrition counseling, breathing techniques, and further education about your condition.  It is important to use good technique with inhaled medicines. Spacer devices may be needed to help improve drug delivery.  HOME CARE INSTRUCTIONS    Do not smoke. Quitting smoking is very important to prevent worsening of COPD.   Avoid exposure to all substances that irritate the airway, especially tobacco  smoke.   If prescribed, take your antibiotics as directed. Finish them even if you start to feel better.   Only take over-the-counter or prescription medicines as directed by your caregiver.   Drink enough fluids to keep your urine clear or pale yellow. This can help thin bronchial secretions.   Use a cool mist vaporizer. This makes it easier to clear your chest when you cough.   If you have a home nebulizer and oxygen, continue to use them as directed.   Maintain all necessary vaccinations to prevent infections.   Exercise regularly.   Eat a healthy diet.   Keep all follow-up appointments as directed by your caregiver.  SEEK IMMEDIATE MEDICAL CARE IF:   You have extreme shortness of breath.   You have trouble talking.   You have severe chest pain or blood in your sputum.   You have a high fever, weakness, repeated vomiting, or fainting.   You feel confused.   You keep getting worse.  MAKE SURE YOU:    Understand these instructions.   Will watch your condition.   Will get help right away if you are not doing well or get worse.  Document Released: 07/09/2007 Document Revised: 12/04/2011 Document Reviewed: 05/09/2011  [employer] Patient Information 2014 [employer].

## 2013-04-23 ENCOUNTER — Telehealth: Payer: Self-pay | Admitting: Internal Medicine

## 2013-04-23 NOTE — Telephone Encounter (Signed)
Requesting more of the samples she got at the last appt for COPD.  Could not make out what was on the message.  lmom for her to call back to clarify.  She said it would cost her $100.00 at the pharmacy.

## 2013-04-24 MED ORDER — FLUTICASONE-SALMETEROL 250-50 MCG/DOSE IN AEPB: 1.0000 | INHALATION_SPRAY | Freq: Two times a day (BID) | RESPIRATORY_TRACT | Status: DC

## 2013-04-24 NOTE — Telephone Encounter (Signed)
Samples upfront. Left detailed mess informing pt.

## 2013-04-24 NOTE — Telephone Encounter (Signed)
I called [name].  The medicine she is requesting samples of is Advair Diskus 250 / 50.

## 2013-05-07 ENCOUNTER — Encounter: Payer: Self-pay | Admitting: Internal Medicine

## 2013-05-07 ENCOUNTER — Ambulatory Visit (INDEPENDENT_AMBULATORY_CARE_PROVIDER_SITE_OTHER): Payer: Medicare Other | Admitting: Internal Medicine

## 2013-05-07 VITALS — BP 130/88 | HR 84 | Temp 98.8°F | Resp 16 | Wt 242.0 lb

## 2013-05-07 DIAGNOSIS — R079 Chest pain, unspecified: Secondary | ICD-10-CM

## 2013-05-07 DIAGNOSIS — E119 Type 2 diabetes mellitus without complications: Secondary | ICD-10-CM

## 2013-05-07 DIAGNOSIS — B029 Zoster without complications: Secondary | ICD-10-CM | POA: Insufficient documentation

## 2013-05-07 MED ORDER — HYDROXYZINE PAMOATE 50 MG PO CAPS: ORAL_CAPSULE | ORAL | Status: DC

## 2013-05-07 MED ORDER — VALACYCLOVIR HCL 1 G PO TABS: 1000.0000 mg | ORAL_TABLET | Freq: Three times a day (TID) | ORAL | Status: DC

## 2013-05-07 NOTE — Assessment & Plan Note (Signed)
Valtrex Rx 

## 2013-05-07 NOTE — Patient Instructions (Addendum)
Gluten free trial (no wheat products) x4-6 weeks. OK to use Gluten-free bread and pasta. Milk free trial (no milk, ice cream and yogurt) x4 weeks. OK to use almond or soy milk. 

## 2013-05-07 NOTE — Assessment & Plan Note (Signed)
8/14 R -- H zoster See Rx

## 2013-05-07 NOTE — Assessment & Plan Note (Signed)
Continue with current prescription therapy as reflected on the Med list.  

## 2013-05-07 NOTE — Progress Notes (Signed)
Subjective:   C/o R CP x 1-2 wks and not feeling well. Rash appeared over the wknd.  Cough This is a new problem. The current episode started in the past 7 days. The problem has been gradually worsening. The problem occurs constantly. The cough is productive of purulent sputum. Associated symptoms include a rash. The treatment provided no relief.  Rash This is a new problem. The current episode started in the past 7 days. The problem has been gradually improving since onset. The affected locations include the chest (R). The rash is characterized by blistering, redness and pain. She was exposed to nothing. Associated symptoms include fatigue. The treatment provided no relief.  Marland Kitchen   UTI is better   She has chronic depression, anxiety, headaches and chronic moderate fibromyalgia/LBP symptoms controlled with medicines. F/u chronic GI complaints.   Wt Readings from Last 3 Encounters:  05/07/13 242 lb (109.77 kg)  04/08/13 242 lb (109.77 kg)  02/24/13 240 lb 4 oz (108.977 kg)   BP Readings from Last 3 Encounters:  05/07/13 130/88  04/08/13 128/84  02/24/13 110/78     Review of Systems  Constitutional: Positive for fatigue. Negative for activity change, appetite change and unexpected weight change.  HENT: Negative for mouth sores.   Eyes: Negative for visual disturbance.  Respiratory: Negative for chest tightness.   Genitourinary: Negative for frequency, difficulty urinating and vaginal pain.  Musculoskeletal: Positive for arthralgias. Negative for gait problem.  Skin: Positive for rash. Negative for pallor.  Neurological: Negative for tremors.  Psychiatric/Behavioral: Positive for decreased concentration. Negative for suicidal ideas, confusion and sleep disturbance. The patient is nervous/anxious.        Objective:   Physical Exam  Constitutional: She appears well-developed. No distress.  Obese  HENT:  Head: Normocephalic.  Right Ear: External ear normal.  Left Ear:  External ear normal.  Nose: Nose normal.  Mouth/Throat: Oropharynx is clear and moist.  Eyes: Conjunctivae are normal. Pupils are equal, round, and reactive to light. Right eye exhibits no discharge. Left eye exhibits no discharge.  Neck: Normal range of motion. Neck supple. No JVD present. No tracheal deviation present. No thyromegaly present.  Cardiovascular: Normal rate, regular rhythm and normal heart sounds.   Pulmonary/Chest: No stridor. No respiratory distress. She has wheezes. She has rales.  Abdominal: Soft. Bowel sounds are normal. She exhibits no distension and no mass. There is no tenderness. There is no rebound and no guarding.  Musculoskeletal: She exhibits no edema and no tenderness.  Lymphadenopathy:    She has no cervical adenopathy.  Neurological: She displays normal reflexes. No cranial nerve deficit. She exhibits normal muscle tone. Coordination normal.  Skin: No rash noted. No erythema.  Psychiatric: Her behavior is normal. Judgment and thought content normal.  depressed  dried blisters on R upper post trap/chest  Lab Results  Component Value Date   WBC 9.5 02/15/2012   HGB 12.2 02/15/2012   HCT 35.3* 02/15/2012   PLT 228 02/15/2012   GLUCOSE 181* 10/28/2012   CHOL 167 07/26/2010   TRIG 332.0* 07/26/2010   HDL 43.10 07/26/2010   LDLDIRECT 80.3 07/26/2010   ALT 45* 02/12/2012   AST 42* 02/12/2012   NA 137 10/28/2012   K 3.8 10/28/2012   CL 104 10/28/2012   CREATININE 0.8 10/28/2012   BUN 16 10/28/2012   CO2 24 10/28/2012   TSH 3.68 07/26/2010   HGBA1C 6.5 07/24/2012   MICROALBUR 0.4 07/26/2010  Assessment & Plan:

## 2013-05-27 ENCOUNTER — Telehealth: Payer: Self-pay | Admitting: *Deleted

## 2013-05-27 NOTE — Telephone Encounter (Signed)
Pt called states she is still having breakouts of shingles.  States she has rash on her chest that is worsening.  Pt is requesting Prednisone. Please advise

## 2013-05-27 NOTE — Telephone Encounter (Signed)
Let me see it - ov thx

## 2013-05-28 ENCOUNTER — Telehealth: Payer: Self-pay | Admitting: *Deleted

## 2013-05-28 ENCOUNTER — Ambulatory Visit (INDEPENDENT_AMBULATORY_CARE_PROVIDER_SITE_OTHER): Payer: Medicare Other | Admitting: Internal Medicine

## 2013-05-28 ENCOUNTER — Encounter: Payer: Self-pay | Admitting: Internal Medicine

## 2013-05-28 ENCOUNTER — Other Ambulatory Visit (INDEPENDENT_AMBULATORY_CARE_PROVIDER_SITE_OTHER): Payer: Medicare Other

## 2013-05-28 VITALS — BP 142/84 | HR 80 | Temp 98.5°F | Resp 16

## 2013-05-28 DIAGNOSIS — B354 Tinea corporis: Secondary | ICD-10-CM

## 2013-05-28 DIAGNOSIS — E119 Type 2 diabetes mellitus without complications: Secondary | ICD-10-CM

## 2013-05-28 DIAGNOSIS — J45909 Unspecified asthma, uncomplicated: Secondary | ICD-10-CM

## 2013-05-28 DIAGNOSIS — N390 Urinary tract infection, site not specified: Secondary | ICD-10-CM

## 2013-05-28 LAB — HEPATIC FUNCTION PANEL
ALT: 20 U/L (ref 0–35)
Albumin: 4.1 g/dL (ref 3.5–5.2)
Alkaline Phosphatase: 44 U/L (ref 39–117)
Bilirubin, Direct: 0.1 mg/dL (ref 0.0–0.3)
Total Protein: 6.8 g/dL (ref 6.0–8.3)

## 2013-05-28 LAB — BASIC METABOLIC PANEL
CO2: 25 mEq/L (ref 19–32)
Calcium: 9.2 mg/dL (ref 8.4–10.5)
Creatinine, Ser: 0.7 mg/dL (ref 0.4–1.2)

## 2013-05-28 LAB — HEMOGLOBIN A1C: Hgb A1c MFr Bld: 7.6 % — ABNORMAL HIGH (ref 4.6–6.5)

## 2013-05-28 MED ORDER — PROMETHAZINE HCL 25 MG PO TABS: ORAL_TABLET | ORAL | Status: DC

## 2013-05-28 MED ORDER — ESTRADIOL 0.5 MG PO TABS: 0.5000 mg | ORAL_TABLET | Freq: Every day | ORAL | Status: DC

## 2013-05-28 MED ORDER — KETOCONAZOLE 2 % EX CREA: TOPICAL_CREAM | Freq: Every day | CUTANEOUS | Status: DC

## 2013-05-28 MED ORDER — FLUCONAZOLE 100 MG PO TABS: ORAL_TABLET | ORAL | Status: DC

## 2013-05-28 NOTE — Patient Instructions (Signed)
Gluten free trial (no wheat products) for 4-6 weeks. OK to use gluten-free bread and gluten-free pasta.  Milk free trial (no milk, ice cream, cheese and yogurt) for 4-6 weeks. OK to use almond, coconut, rice or soy milk.  

## 2013-05-28 NOTE — Assessment & Plan Note (Signed)
Continue with current prescription therapy as reflected on the Med list.  

## 2013-05-28 NOTE — Assessment & Plan Note (Signed)
9/14 Chest, under breasts, between toes Diflucan Ketoconazole topically

## 2013-05-28 NOTE — Progress Notes (Signed)
Subjective:     C/o chest, under breasts, between toes rash x wk  Cough This is a new problem. The current episode started 1 to 4 weeks ago. The problem has been resolved. The problem occurs constantly. The cough is productive of purulent sputum. Associated symptoms include a rash. The treatment provided no relief.  Rash This is a new problem. The current episode started in the past 7 days. The problem has been gradually worsening since onset. The affected locations include the chest, neck and torso (R). The rash is characterized by blistering, redness and pain. She was exposed to nothing. Associated symptoms include coughing and fatigue. The treatment provided no relief.  .      She has chronic depression, anxiety, headaches and chronic moderate fibromyalgia/LBP symptoms controlled with medicines. F/u chronic GI complaints.   Wt Readings from Last 3 Encounters:  05/07/13 242 lb (109.77 kg)  04/08/13 242 lb (109.77 kg)  02/24/13 240 lb 4 oz (108.977 kg)   BP Readings from Last 3 Encounters:  05/28/13 142/84  05/07/13 130/88  04/08/13 128/84     Review of Systems  Constitutional: Positive for fatigue. Negative for activity change, appetite change and unexpected weight change.  HENT: Negative for mouth sores.   Eyes: Negative for visual disturbance.  Respiratory: Positive for cough. Negative for chest tightness.   Genitourinary: Negative for frequency, difficulty urinating and vaginal pain.  Musculoskeletal: Positive for arthralgias. Negative for gait problem.  Skin: Positive for rash. Negative for pallor.  Neurological: Negative for tremors.  Psychiatric/Behavioral: Positive for decreased concentration. Negative for suicidal ideas, confusion and sleep disturbance. The patient is nervous/anxious.        Objective:   Physical Exam  Constitutional: She appears well-developed. No distress.  Obese  HENT:  Head: Normocephalic.  Right Ear: External ear normal.  Left Ear:  External ear normal.  Nose: Nose normal.  Mouth/Throat: Oropharynx is clear and moist.  Eyes: Conjunctivae are normal. Pupils are equal, round, and reactive to light. Right eye exhibits no discharge. Left eye exhibits no discharge.  Neck: Normal range of motion. Neck supple. No JVD present. No tracheal deviation present. No thyromegaly present.  Cardiovascular: Normal rate, regular rhythm and normal heart sounds.   Pulmonary/Chest: No stridor. No respiratory distress. She has wheezes. She has rales.  Abdominal: Soft. Bowel sounds are normal. She exhibits no distension and no mass. There is no tenderness. There is no rebound and no guarding.  Musculoskeletal: She exhibits no edema and no tenderness.  Lymphadenopathy:    She has no cervical adenopathy.  Neurological: She displays normal reflexes. No cranial nerve deficit. She exhibits normal muscle tone. Coordination normal.  Skin: No rash noted. No erythema.  Psychiatric: Her behavior is normal. Judgment and thought content normal.  depressed  eryth scaly papules on chest, under breasts, between toes  Lab Results  Component Value Date   WBC 9.5 02/15/2012   HGB 12.2 02/15/2012   HCT 35.3* 02/15/2012   PLT 228 02/15/2012   GLUCOSE 181* 10/28/2012   CHOL 167 07/26/2010   TRIG 332.0* 07/26/2010   HDL 43.10 07/26/2010   LDLDIRECT 80.3 07/26/2010   ALT 45* 02/12/2012   AST 42* 02/12/2012   NA 137 10/28/2012   K 3.8 10/28/2012   CL 104 10/28/2012   CREATININE 0.8 10/28/2012   BUN 16 10/28/2012   CO2 24 10/28/2012   TSH 3.68 07/26/2010   HGBA1C 6.5 07/24/2012   MICROALBUR 0.4 07/26/2010  Assessment & Plan:

## 2013-05-28 NOTE — Assessment & Plan Note (Signed)
Resolved

## 2013-05-28 NOTE — Assessment & Plan Note (Signed)
Wt Readings from Last 3 Encounters:  05/07/13 242 lb (109.77 kg)  04/08/13 242 lb (109.77 kg)  02/24/13 240 lb 4 oz (108.977 kg)

## 2013-05-28 NOTE — Telephone Encounter (Signed)
[facility] Triage Call Report Triage Record Num: [id number] Operator: Maryfrances Bunnell Patient Name: [name] Call Date & Time: 05/27/2013 7:34:38PM Patient Phone: [phone] PCP: Sonda Primes Patient Gender: Female PCP Fax : [phone] Patient DOB: [birth year] Practice Name: [facility] Reason for Call: Caller: [name]/Patient; PCP: Sonda Primes (Adults only); CB#: [phone]; Call regarding Rash/Hives; Red, raised rash, grown from 50 cent size area that started below breast in mid sternal area and now spreading up chest to neck, no blisters, painful, hot, itches; Onset 05/25/13; Hx of Shingles to right shoulder blade and under arm, 2 to right buttock, tops of feet and toes, 2 places to right wrist. All areas cleared up. Patient used Prednisone 20 mg 9/1 and 20 mg 9/2 from left over prescription; Benadryl 25 mg taken today; Requesting prescription for Prednisone, has spoke with office today and note for MD in emr. Triaged per Rash Protocol "Severe or persistent itching that interferes with ability to carry out usual activities or sleep." Disposition is see in 24 hours, patient dosen't want appt, doesn't want to get out in the heat. Will f/u with office in am. Home care advice given. Protocol(s) Used: Rash Recommended Outcome per Protocol: See Provider within 24 hours Reason for Outcome: Severe or persistent itching that interferes with ability to carry out usual activities or sleep Care Advice: Apply cool compresses for 30 minutes 4 to 6 times a day or take bath/shower in cool water to relieve itching. Do not use hot water as this may aggravate itching. Follow with application of a bland lotion such as calamine (do not apply to the eyes or genitals). ~ ~ Call provider for severe itching that is not relieved with home care treatments. ~ SYMPTOM / CONDITION MANAGEMENT ~ INFECTION CONTROL For symptom relief, consider nonprescription antihistamines (such as Allerest,  Allegra, Claritin, Zyrtec, Chlor-Trimetron, Benadryl, etc.) as directed on label or by pharmacist. Drowsiness may result, especially in geriatric patients. Non-sedating antihistamines are available without a prescription. ~ 09/

## 2013-05-28 NOTE — Assessment & Plan Note (Signed)
Continue with current prescription therapy as reflected on the Med list. Labs  

## 2013-05-28 NOTE — Telephone Encounter (Signed)
Pt called in to schedule appt.

## 2013-06-06 ENCOUNTER — Ambulatory Visit: Payer: Medicare Other | Admitting: Internal Medicine

## 2013-07-07 ENCOUNTER — Other Ambulatory Visit: Payer: Self-pay | Admitting: Internal Medicine

## 2013-07-18 ENCOUNTER — Ambulatory Visit (INDEPENDENT_AMBULATORY_CARE_PROVIDER_SITE_OTHER): Payer: Medicare Other | Admitting: Internal Medicine

## 2013-07-18 ENCOUNTER — Encounter: Payer: Self-pay | Admitting: Internal Medicine

## 2013-07-18 VITALS — BP 120/82 | HR 72 | Temp 98.1°F | Wt 243.4 lb

## 2013-07-18 DIAGNOSIS — L259 Unspecified contact dermatitis, unspecified cause: Secondary | ICD-10-CM

## 2013-07-18 DIAGNOSIS — L239 Allergic contact dermatitis, unspecified cause: Secondary | ICD-10-CM

## 2013-07-18 MED ORDER — METHYLPREDNISOLONE ACETATE 80 MG/ML IJ SUSP
80.0000 mg | Freq: Once | INTRAMUSCULAR | Status: AC
  Administered 2013-07-18: 80 mg via INTRAMUSCULAR

## 2013-07-18 MED ORDER — PREDNISONE 10 MG PO TABS: ORAL_TABLET | ORAL | Status: DC

## 2013-07-18 MED ORDER — DIPHENHYDRAMINE HCL 50 MG/ML IJ SOLN
25.0000 mg | Freq: Once | INTRAMUSCULAR | Status: AC
  Administered 2013-07-18: 25 mg via INTRAVENOUS

## 2013-07-18 NOTE — Progress Notes (Signed)
Subjective:    Patient ID: [name], female    DOB: 1956-02-19, 57 y.o.   MRN: [id number]  HPI  Pt presents to the clinic today with c/o a rash around her neck extending down her chest. This started 2 days ago. The rash if very red and bumpy. It does itch. She has taken 50 mg Benadryl last night without relief. She did put ketoconazole cream on it without relief. She reports this rash is different from the other types of rashes she has had. She denies changing soaps, detergents or lotions. She has not started any new medications. She has not eaten anything that she is allergic to.  Review of Systems  Past Medical History  Diagnosis Date  . Asthma   . COPD (chronic obstructive pulmonary disease)   . Depression   . GERD (gastroesophageal reflux disease)     Dr Juanda Chance  . Gastroparesis   . Migraines   . Menopause   . IBS (irritable bowel syndrome)   . Diabetes mellitus type II   . Peripheral neuropathy   . Fibromyalgia   . Obesity   . Barrett's esophagus   . MRSA (methicillin resistant Staphylococcus aureus)     possible (by verbal report from pt)  . History of hiatal hernia   . OSA (obstructive sleep apnea)     pt denies 02/13/12, 02/20/12  . C. difficile colitis 01/2012 hosp    Current Outpatient Prescriptions  Medication Sig Dispense Refill  . albuterol (VENTOLIN HFA) 108 (90 BASE) MCG/ACT inhaler Inhale 2 puffs into the lungs every 6 (six) hours as needed.  3.7 g  2  . Botulinum Toxin Type A (BOTOX) 200 UNITS SOLR Every 3 months for headaches, given by Neurologist, Dr. Neale Burly       . cholecalciferol (VITAMIN D) 1000 UNITS tablet Take 2 tablets (2,000 Units total) by mouth daily.  100 tablet  3  . clonazePAM (KLONOPIN) 1 MG tablet TAKE 1 TABLET THREE TIMES A DAY AS NEEDED  90 tablet  1  . cycloSPORINE (RESTASIS) 0.05 % ophthalmic emulsion Place 1 drop into both eyes daily.        Marland Kitchen dicyclomine (BENTYL) 20 MG tablet Take 1 tablet (20 mg total) by mouth 3 (three) times  daily as needed.  90 tablet  3  . escitalopram (LEXAPRO) 20 MG tablet TAKE 0.5-1 TABLETS (10-20 MG TOTAL) BY MOUTH DAILY.  30 tablet  2  . estradiol (ESTRACE) 0.5 MG tablet Take 1 tablet (0.5 mg total) by mouth daily.  90 tablet  1  . fluconazole (DIFLUCAN) 100 MG tablet Take 2 tabs on day#1, then 1 tab daily on Days #2-10  11 tablet  1  . Fluticasone-Salmeterol (ADVAIR DISKUS) 250-50 MCG/DOSE AEPB Inhale 1 puff into the lungs 2 (two) times daily.  1 each  0  . furosemide (LASIX) 20 MG tablet Take 1 tablet (20 mg total) by mouth daily as needed (swelling). As needed  30 tablet  11  . glucose blood (ONE TOUCH ULTRA TEST) test strip Use as directed dx 250.00  100 each  3  . HYDROcodone-acetaminophen (NORCO) 10-325 MG per tablet Take 1 tablet by mouth 4 (four) times daily as needed for pain.  120 tablet  2  . hydrOXYzine (VISTARIL) 50 MG capsule TAKE 1 CAPSULE (50 MG TOTAL) BY MOUTH ONCE. 2 TAB BY MOUTH AS NEEDED FOR INSOMNIA  90 capsule  3  . ipratropium-albuterol (DUONEB) 0.5-2.5 (3) MG/3ML SOLN Take 3 mLs by nebulization 4 (  four) times daily as needed.  180 mL  3  . ketoconazole (NIZORAL) 2 % cream Apply topically daily. Use for 1 month  60 g  2  . metFORMIN (GLUCOPHAGE) 500 MG tablet       . metoCLOPramide (REGLAN) 10 MG tablet Take 5-10 mg by mouth 4 (four) times daily. As needed      . montelukast (SINGULAIR) 10 MG tablet TAKE 1 TABLET (10 MG TOTAL) BY MOUTH AT BEDTIME.  30 tablet  5  . omeprazole (PRILOSEC) 40 MG capsule Take 1 capsule (40 mg total) by mouth daily.  90 capsule  3  . PATADAY 0.2 % SOLN Use everyday      . promethazine (PHENERGAN) 25 MG tablet Take 1-2 tablets every 6 hours prn  90 tablet  0  . tiZANidine (ZANAFLEX) 4 MG tablet Take 1 tablet (4 mg total) by mouth every 8 (eight) hours as needed.  90 tablet  1  . tiZANidine (ZANAFLEX) 4 MG tablet TAKE 1 TABLET (4 MG TOTAL) BY MOUTH EVERY 8 (EIGHT) HOURS AS NEEDED.  90 tablet  1  . triamcinolone (KENALOG) 0.1 % cream Apply  topically 3 (three) times daily. As needed for rash  45 g  1  . valACYclovir (VALTREX) 1000 MG tablet Take 1 tablet (1,000 mg total) by mouth 3 (three) times daily.  30 tablet  0  . VENTOLIN HFA 108 (90 BASE) MCG/ACT inhaler INHALE 2 PUFFS INTO THE LUNGS EVERY 4 (FOUR) HOURS AS NEEDED.  18 each  2   No current facility-administered medications for this visit.    Allergies  Allergen Reactions  . Droperidol     twitching  . Amoxicillin-Pot Clavulanate     REACTION: diarrhea  . Ciprofloxacin     Sensitivity.   . Duloxetine     REACTION: diarrhea  . Hydromorphone Hcl   . Iohexol      Code: HIVES, Desc: PT developed 3 hives post injection of Omni 300. Observed by Dr Clifton James. Given 50 mg PO benedryl.Hives subsided before being released., Onset Date: [id number]   . Ketorolac Tromethamine   . Moxifloxacin   . Penicillins     sensitivity  . Pioglitazone     REACTION: weight gain  . Pregabalin     REACTION: anaphylaxis  . Pristiq [Desvenlafaxine Succinate Monohydrate]     abd pains  . Quetiapine     REACTION: nightmares  . Topiramate     Family History  Problem Relation Age of Onset  . Diabetes Mother   . Lung cancer Mother   . Cancer Mother     lung  . Esophageal cancer Father   . Heart disease Father   . Cancer Father     esophageal  . Diabetes Father   . Colon polyps Father     Family History  . Breast cancer Maternal Grandmother   . Heart disease Maternal Grandmother   . Diabetes Maternal Grandmother   . Cancer Maternal Grandmother     breast, colon  . Heart attack Brother 55  . Colon cancer Maternal Grandfather   . Colon polyps Maternal Grandfather   . Diabetes Maternal Grandfather   . Colon polyps Paternal Grandfather   . Colon cancer Paternal Grandfather   . Breast cancer Maternal Aunt   . Arthritis    . Hypertension    . Uterine cancer Other     great grandmother    History   Social History  . Marital Status: Married  Spouse Name: N/A     Number of Children: 3  . Years of Education: N/A   Occupational History  . CMA disabled    Social History Main Topics  . Smoking status: Former Smoker -- 1.00 packs/day for 20 years    Quit date: 12/24/2009  . Smokeless tobacco: Not on file     Comment: Stopped April 2011  . Alcohol Use: No     Comment: occasional (1-2 times per month)  . Drug Use: No  . Sexual Activity: Yes   Other Topics Concern  . Not on file   Social History Narrative  . No narrative on file     Constitutional: Denies fever, malaise, fatigue, headache or abrupt weight changes. .  Skin: Pt reports rash around her neck. Denies redness, lesions or ulcercations.    No other specific complaints in a complete review of systems (except as listed in HPI above).     Objective:   Physical Exam   BP 120/82  Pulse 72  Temp(Src) 98.1 F (36.7 C) (Oral)  Wt 243 lb 6.4 oz (110.406 kg)  BMI 40.5 kg/m2  SpO2 96% Wt Readings from Last 3 Encounters:  07/18/13 243 lb 6.4 oz (110.406 kg)  05/07/13 242 lb (109.77 kg)  04/08/13 242 lb (109.77 kg)    General: Appears her stated age, well developed, well nourished in NAD. Skin: Warm, dry and intact.  Papular rash on erythematous base noted on upper chest, only visible where chest is not covered by her shirt. Cardiovascular: Normal rate and rhythm. S1,S2 noted.  No murmur, rubs or gallops noted. No JVD or BLE edema. No carotid bruits noted. Pulmonary/Chest: Normal effort and positive vesicular breath sounds. No respiratory distress. No wheezes, rales or ronchi noted.   BMET    Component Value Date/Time   NA 136 05/28/2013 1007   K 3.6 05/28/2013 1007   CL 103 05/28/2013 1007   CO2 25 05/28/2013 1007   GLUCOSE 161* 05/28/2013 1007   GLUCOSE 138* 07/25/2006 0948   BUN 13 05/28/2013 1007   CREATININE 0.7 05/28/2013 1007   CALCIUM 9.2 05/28/2013 1007   GFRNONAA >90 02/15/2012 0710   GFRAA >90 02/15/2012 0710    Lipid Panel     Component Value Date/Time   CHOL 167  07/26/2010 1009   TRIG 332.0* 07/26/2010 1009   HDL 43.10 07/26/2010 1009   CHOLHDL 4 07/26/2010 1009   VLDL 66.4* 07/26/2010 1009    CBC    Component Value Date/Time   WBC 9.5 02/15/2012 0710   RBC 4.08 02/15/2012 0710   HGB 12.2 02/15/2012 0710   HCT 35.3* 02/15/2012 0710   PLT 228 02/15/2012 0710   MCV 86.5 02/15/2012 0710   MCH 29.9 02/15/2012 0710   MCHC 34.6 02/15/2012 0710   RDW 13.0 02/15/2012 0710   LYMPHSABS 1.4 02/12/2012 0628   MONOABS 0.1 02/12/2012 0628   EOSABS 0.0 02/12/2012 0628   BASOSABS 0.0 02/12/2012 0628    Hgb A1C Lab Results  Component Value Date   HGBA1C 7.6* 05/28/2013        Assessment & Plan:   Allergic/Contact dermatitis of upper chest, new onset:  80 mg Depo IM today 25 mg Benadryl IM today eRx for pred taper Avoid scratching it if you can  RTC as needed or if rash persist through the weekend or worsens

## 2013-07-18 NOTE — Patient Instructions (Signed)

## 2013-07-22 ENCOUNTER — Ambulatory Visit: Payer: Medicare Other | Admitting: Psychology

## 2013-07-31 ENCOUNTER — Other Ambulatory Visit: Payer: Self-pay

## 2013-08-02 ENCOUNTER — Other Ambulatory Visit: Payer: Self-pay | Admitting: Internal Medicine

## 2013-08-04 ENCOUNTER — Telehealth: Payer: Self-pay | Admitting: *Deleted

## 2013-08-04 NOTE — Telephone Encounter (Signed)
Pt called requesting Hydrocodone refill.  Please advise 

## 2013-08-05 ENCOUNTER — Other Ambulatory Visit: Payer: Self-pay | Admitting: *Deleted

## 2013-08-05 ENCOUNTER — Other Ambulatory Visit: Payer: Self-pay | Admitting: Internal Medicine

## 2013-08-05 MED ORDER — HYDROCODONE-ACETAMINOPHEN 10-325 MG PO TABS: 1.0000 | ORAL_TABLET | Freq: Four times a day (QID) | ORAL | Status: DC | PRN

## 2013-08-05 NOTE — Telephone Encounter (Signed)
OV q 3 mo OK to fill this prescription with additional refills x0 Thank you!

## 2013-08-05 NOTE — Telephone Encounter (Signed)
Spoke with pt advised Rx ready for pick up 

## 2013-08-25 ENCOUNTER — Encounter: Payer: Self-pay | Admitting: Internal Medicine

## 2013-08-25 ENCOUNTER — Ambulatory Visit (INDEPENDENT_AMBULATORY_CARE_PROVIDER_SITE_OTHER): Payer: Medicare Other | Admitting: Internal Medicine

## 2013-08-25 VITALS — BP 118/90 | HR 71 | Temp 98.2°F | Wt 245.0 lb

## 2013-08-25 DIAGNOSIS — T07XXXA Unspecified multiple injuries, initial encounter: Secondary | ICD-10-CM

## 2013-08-25 DIAGNOSIS — W64XXXA Exposure to other animate mechanical forces, initial encounter: Secondary | ICD-10-CM

## 2013-08-25 MED ORDER — DOXYCYCLINE HYCLATE 100 MG PO TABS: 100.0000 mg | ORAL_TABLET | Freq: Two times a day (BID) | ORAL | Status: DC

## 2013-08-25 NOTE — Patient Instructions (Signed)
Hope you get to feeling better!  Arm scratch: We are going to prescribe you a 7 day course of doxycycline. Take this medication twice a day as prescribed.   For the itching, use a skin moisturizer and Claritin as needed. Clean your wound with soap and water twice a day.

## 2013-08-25 NOTE — Progress Notes (Signed)
Subjective:     Patient ID: [name], female   DOB: 02-Mar-1956, 57 y.o.   MRN: [id number]  HPI [name] was scratched on her L forearm last Thursday by an 77 lab that her son adopted. Initially, the dog caused her to bleed profusely from her forearm. The bleeding stopped with pressure. Since then, her arm keeps prevents her from sleeping because of the itching and pain. She is draining green pus out of the top of her wound 3x a day.  This morning, the itching had spread to her upper arm (lateral triceps). Additionally, she noticed red spots on her upper arm this morning. She reports some fever (temp of 100.1) and has felt hot. She reports night sweats and chills. She reports some nausea and loss of appetite yesterday.   Benadryl or vistaril did not provide any relief. Neosporin with pain helps provide itching/pain relief for 5 minutes before it returns.   She has a history of MRSA on her breast 2 years ago, which is concern for her.  Past Medical History  Diagnosis Date  . Asthma   . COPD (chronic obstructive pulmonary disease)   . Depression   . GERD (gastroesophageal reflux disease)     Dr Juanda Chance  . Gastroparesis   . Migraines   . Menopause   . IBS (irritable bowel syndrome)   . Diabetes mellitus type II   . Peripheral neuropathy   . Fibromyalgia   . Obesity   . Barrett's esophagus   . MRSA (methicillin resistant Staphylococcus aureus)     possible (by verbal report from pt)  . History of hiatal hernia   . OSA (obstructive sleep apnea)     pt denies 02/13/12, 02/20/12  . C. difficile colitis 01/2012 hosp   Past Surgical History  Procedure Laterality Date  . Abdominal hysterectomy      Complete  . Sinus surgery with instatrak      x3  . Surgery left heel spur    . Appendectomy    . Ventral hernia repair      with mesh  . Cholecystectomy    . Nissen fundoplication  1999  . Mouth surgery    . Esophagogastroduodenoscopy  02/13/2012    Procedure:  ESOPHAGOGASTRODUODENOSCOPY (EGD);  Surgeon: Willis Modena, MD;  Location: [facility] ENDOSCOPY;  Service: Endoscopy;  Laterality: N/A;   Family History  Problem Relation Age of Onset  . Diabetes Mother   . Lung cancer Mother   . Cancer Mother     lung  . Esophageal cancer Father   . Heart disease Father   . Cancer Father     esophageal  . Diabetes Father   . Colon polyps Father     Family History  . Breast cancer Maternal Grandmother   . Heart disease Maternal Grandmother   . Diabetes Maternal Grandmother   . Cancer Maternal Grandmother     breast, colon  . Heart attack Brother 55  . Colon cancer Maternal Grandfather   . Colon polyps Maternal Grandfather   . Diabetes Maternal Grandfather   . Colon polyps Paternal Grandfather   . Colon cancer Paternal Grandfather   . Breast cancer Maternal Aunt   . Arthritis    . Hypertension    . Uterine cancer Other     great grandmother   History   Social History  . Marital Status: Married    Spouse Name: N/A    Number of Children: 3  . Years of Education: N/A  Occupational History  . CMA disabled    Social History Main Topics  . Smoking status: Former Smoker -- 1.00 packs/day for 20 years    Quit date: 12/24/2009  . Smokeless tobacco: Not on file     Comment: Stopped April 2011  . Alcohol Use: No     Comment: occasional (1-2 times per month)  . Drug Use: No  . Sexual Activity: Yes   Other Topics Concern  . Not on file   Social History Narrative  . No narrative on file     Current Outpatient Prescriptions on File Prior to Visit  Medication Sig Dispense Refill  . albuterol (VENTOLIN HFA) 108 (90 BASE) MCG/ACT inhaler Inhale 2 puffs into the lungs every 6 (six) hours as needed.  3.7 g  2  . Botulinum Toxin Type A (BOTOX) 200 UNITS SOLR Every 3 months for headaches, given by Neurologist, Dr. Neale Burly       . cholecalciferol (VITAMIN D) 1000 UNITS tablet Take 2 tablets (2,000 Units total) by mouth daily.  100 tablet  3  .  clonazePAM (KLONOPIN) 1 MG tablet TAKE 1 TABLET THREE TIMES A DAY AS NEEDED  90 tablet  1  . cycloSPORINE (RESTASIS) 0.05 % ophthalmic emulsion Place 1 drop into both eyes daily.        Marland Kitchen dicyclomine (BENTYL) 20 MG tablet Take 1 tablet (20 mg total) by mouth 3 (three) times daily as needed.  90 tablet  3  . escitalopram (LEXAPRO) 20 MG tablet TAKE 1/2-1 TABLET BY MOUTH DAILY  30 tablet  5  . estradiol (ESTRACE) 0.5 MG tablet Take 1 tablet (0.5 mg total) by mouth daily.  90 tablet  1  . fluconazole (DIFLUCAN) 100 MG tablet Take 2 tabs on day#1, then 1 tab daily on Days #2-10  11 tablet  1  . Fluticasone-Salmeterol (ADVAIR DISKUS) 250-50 MCG/DOSE AEPB Inhale 1 puff into the lungs 2 (two) times daily.  1 each  0  . furosemide (LASIX) 20 MG tablet Take 1 tablet (20 mg total) by mouth daily as needed (swelling). As needed  30 tablet  11  . glucose blood (ONE TOUCH ULTRA TEST) test strip Use as directed dx 250.00  100 each  3  . HYDROcodone-acetaminophen (NORCO) 10-325 MG per tablet Take 1 tablet by mouth 4 (four) times daily as needed.  120 tablet  0  . hydrOXYzine (VISTARIL) 50 MG capsule TAKE 1 CAPSULE (50 MG TOTAL) BY MOUTH ONCE. 2 TAB BY MOUTH AS NEEDED FOR INSOMNIA  90 capsule  3  . ipratropium-albuterol (DUONEB) 0.5-2.5 (3) MG/3ML SOLN TAKE 3 MLS BY NEBULIZATION 4 (FOUR) TIMES DAILY AS NEEDED.  180 mL  3  . ketoconazole (NIZORAL) 2 % cream Apply topically daily. Use for 1 month  60 g  2  . metFORMIN (GLUCOPHAGE) 500 MG tablet       . metoCLOPramide (REGLAN) 10 MG tablet Take 5-10 mg by mouth 4 (four) times daily. As needed      . montelukast (SINGULAIR) 10 MG tablet TAKE 1 TABLET (10 MG TOTAL) BY MOUTH AT BEDTIME.  30 tablet  5  . omeprazole (PRILOSEC) 40 MG capsule Take 1 capsule (40 mg total) by mouth daily.  90 capsule  3  . PATADAY 0.2 % SOLN Use everyday      . predniSONE (DELTASONE) 10 MG tablet Take 3 tablets on days 1-2, take 2 tablets on days 3-4, take 1 tablet on days 5-6  12 tablet  0   .  promethazine (PHENERGAN) 25 MG tablet Take 1-2 tablets every 6 hours prn  90 tablet  0  . tiZANidine (ZANAFLEX) 4 MG tablet Take 1 tablet (4 mg total) by mouth every 8 (eight) hours as needed.  90 tablet  1  . tiZANidine (ZANAFLEX) 4 MG tablet TAKE 1 TABLET (4 MG TOTAL) BY MOUTH EVERY 8 (EIGHT) HOURS AS NEEDED.  90 tablet  1  . triamcinolone (KENALOG) 0.1 % cream Apply topically 3 (three) times daily. As needed for rash  45 g  1  . valACYclovir (VALTREX) 1000 MG tablet Take 1 tablet (1,000 mg total) by mouth 3 (three) times daily.  30 tablet  0  . VENTOLIN HFA 108 (90 BASE) MCG/ACT inhaler INHALE 2 PUFFS INTO THE LUNGS EVERY 4 (FOUR) HOURS AS NEEDED.  18 each  2   No current facility-administered medications on file prior to visit.     Review of Systems Negative except as mentioned in HPI.     Objective:   Physical Exam CV: Normal rate and rhythm. 2+ radial pulses.  Pulm: Normal work of breathing MSK: Left 15 cm scratch extending her wrist to her antecubital fossa.  Erythematous base surrounding her scratch. No axillary lymphadenopathy. Erythematous spots on her left lateral triceps.     Assessment:    [name] is a 57 yo female with PMH of fibromylagia, diabetes with diabetic neprhopathy, migraines, and irritable bowel who presents today after a dog scratch last week.  1) Left Forearm scratch: She was prescribed doxycline 100 mg twice a day for 7 days. Use skin moisturizer to help with itching along with Claritin. Clean your wound with soap and water daily.

## 2013-08-25 NOTE — Progress Notes (Signed)
Pre visit review using our clinic review tool, if applicable. No additional management support is needed unless otherwise documented below in the visit note. 

## 2013-08-27 ENCOUNTER — Other Ambulatory Visit: Payer: Self-pay | Admitting: Internal Medicine

## 2013-08-28 ENCOUNTER — Ambulatory Visit: Payer: Medicare Other | Admitting: Internal Medicine

## 2013-10-03 ENCOUNTER — Telehealth: Payer: Self-pay | Admitting: *Deleted

## 2013-10-03 NOTE — Telephone Encounter (Signed)
Pt called requesting a refill for hydrocodone.  Last OV 08/25/2013.  Please advise.  CB# [phone]

## 2013-10-03 NOTE — Telephone Encounter (Signed)
Ok to refill? Hydrocodone 10-325mg ? Last OV 9.3.14 Last filled 11.11.14

## 2013-10-03 NOTE — Telephone Encounter (Signed)
Ok if time Thx 

## 2013-10-06 ENCOUNTER — Other Ambulatory Visit: Payer: Self-pay | Admitting: *Deleted

## 2013-10-06 MED ORDER — HYDROCODONE-ACETAMINOPHEN 10-325 MG PO TABS: 1.0000 | ORAL_TABLET | Freq: Four times a day (QID) | ORAL | Status: DC | PRN

## 2013-10-06 NOTE — Telephone Encounter (Signed)
Script printed and awaiting MD sig.  Notified would be available for p/u by close of business today.

## 2013-10-06 NOTE — Telephone Encounter (Signed)
Left message for patient that script had been printed and awaiting MD signature & would be available for pick up by close of business today.

## 2013-10-10 ENCOUNTER — Other Ambulatory Visit: Payer: Self-pay | Admitting: Internal Medicine

## 2013-10-20 ENCOUNTER — Other Ambulatory Visit: Payer: Self-pay | Admitting: Internal Medicine

## 2013-10-24 ENCOUNTER — Telehealth: Payer: Self-pay | Admitting: Internal Medicine

## 2013-10-24 NOTE — Telephone Encounter (Signed)
Received 2 pages from Raliegh Ip, sent to Dr. Oletha Blend. 10/24/13/ss

## 2013-10-30 ENCOUNTER — Other Ambulatory Visit: Payer: Self-pay | Admitting: *Deleted

## 2013-10-30 MED ORDER — ALBUTEROL SULFATE HFA 108 (90 BASE) MCG/ACT IN AERS: 2.0000 | INHALATION_SPRAY | RESPIRATORY_TRACT | Status: DC | PRN

## 2013-11-12 ENCOUNTER — Telehealth: Payer: Self-pay | Admitting: *Deleted

## 2013-11-12 NOTE — Telephone Encounter (Signed)
OK to fill this prescription with additional refills x0 Thank you!  

## 2013-11-12 NOTE — Telephone Encounter (Signed)
Pt is requesting Rf on hydroxyzine sent to [facility]?

## 2013-11-13 ENCOUNTER — Telehealth: Payer: Self-pay | Admitting: *Deleted

## 2013-11-13 ENCOUNTER — Other Ambulatory Visit: Payer: Self-pay | Admitting: *Deleted

## 2013-11-13 MED ORDER — HYDROXYZINE PAMOATE 50 MG PO CAPS: ORAL_CAPSULE | ORAL | Status: DC

## 2013-11-13 NOTE — Telephone Encounter (Signed)
Patient phoned to confirm receipt of refill fax request from pharmacy.  Reviewed MAR, it was sent earlier today.

## 2013-11-13 NOTE — Telephone Encounter (Signed)
rx sent to pharmacy

## 2013-11-13 NOTE — Telephone Encounter (Signed)
Rec fax requesting Refill on Citalopram. Lexapro is active on med list. Need to know which one pt is needing. Left mess for patient to call back.

## 2013-11-14 MED ORDER — ESCITALOPRAM OXALATE 20 MG PO TABS: 10.0000 mg | ORAL_TABLET | Freq: Every day | ORAL | Status: DC

## 2013-11-14 NOTE — Telephone Encounter (Signed)
Patient returned [name]'s phone call and stated it is her lexapro she is needing.    CB# [phone]

## 2013-11-14 NOTE — Telephone Encounter (Signed)
Done

## 2013-11-17 ENCOUNTER — Other Ambulatory Visit: Payer: Self-pay | Admitting: *Deleted

## 2013-11-18 MED ORDER — TIZANIDINE HCL 4 MG PO TABS: 4.0000 mg | ORAL_TABLET | Freq: Three times a day (TID) | ORAL | Status: DC | PRN

## 2013-11-18 MED ORDER — OMEPRAZOLE 40 MG PO CPDR: 40.0000 mg | DELAYED_RELEASE_CAPSULE | Freq: Every day | ORAL | Status: AC

## 2013-11-18 MED ORDER — ALBUTEROL SULFATE HFA 108 (90 BASE) MCG/ACT IN AERS
2.0000 | INHALATION_SPRAY | RESPIRATORY_TRACT | Status: DC | PRN
Start: ? — End: 2014-07-27

## 2013-11-21 ENCOUNTER — Telehealth: Payer: Self-pay | Admitting: *Deleted

## 2013-11-21 NOTE — Telephone Encounter (Signed)
Patient has phoned again stating that pharmacy continues to place "loss of scripts" on MD"s office and requests Erline Levine call her back.  CB# [phone]

## 2013-11-25 MED ORDER — ESTRADIOL 0.5 MG PO TABS: 0.5000 mg | ORAL_TABLET | Freq: Every day | ORAL | Status: DC

## 2013-11-25 MED ORDER — METFORMIN HCL 500 MG PO TABS: ORAL_TABLET | ORAL | Status: DC

## 2013-11-25 MED ORDER — MONTELUKAST SODIUM 10 MG PO TABS: ORAL_TABLET | ORAL | Status: DC

## 2013-11-25 NOTE — Telephone Encounter (Signed)
Patient phoned & is still needing refills for the following sent to [facility]  Metformin  Dicycolomine  Dulera (not on MAR-states she's been receiving samples)  Estrace   Also, reviewed MAR with patient and deleted duplicates & meds she no longer takes. Pt has OV for Thursday morning at 1015 with PCP.  Am refilling singulair, metformin and estrace per protocol.

## 2013-11-25 NOTE — Telephone Encounter (Signed)
Ok to ref [employer]

## 2013-11-27 ENCOUNTER — Other Ambulatory Visit (INDEPENDENT_AMBULATORY_CARE_PROVIDER_SITE_OTHER): Payer: Medicare Other

## 2013-11-27 ENCOUNTER — Encounter: Payer: Self-pay | Admitting: Internal Medicine

## 2013-11-27 ENCOUNTER — Ambulatory Visit (INDEPENDENT_AMBULATORY_CARE_PROVIDER_SITE_OTHER): Payer: Medicare Other | Admitting: Internal Medicine

## 2013-11-27 VITALS — BP 118/84 | HR 76 | Temp 97.4°F | Resp 16 | Wt 250.0 lb

## 2013-11-27 DIAGNOSIS — N390 Urinary tract infection, site not specified: Secondary | ICD-10-CM

## 2013-11-27 DIAGNOSIS — F3289 Other specified depressive episodes: Secondary | ICD-10-CM

## 2013-11-27 DIAGNOSIS — E119 Type 2 diabetes mellitus without complications: Secondary | ICD-10-CM

## 2013-11-27 DIAGNOSIS — K219 Gastro-esophageal reflux disease without esophagitis: Secondary | ICD-10-CM

## 2013-11-27 DIAGNOSIS — J45909 Unspecified asthma, uncomplicated: Secondary | ICD-10-CM

## 2013-11-27 DIAGNOSIS — F329 Major depressive disorder, single episode, unspecified: Secondary | ICD-10-CM

## 2013-11-27 LAB — CBC WITH DIFFERENTIAL/PLATELET
BASOS ABS: 0.1 10*3/uL (ref 0.0–0.1)
Basophils Relative: 0.6 % (ref 0.0–3.0)
EOS ABS: 0.3 10*3/uL (ref 0.0–0.7)
Eosinophils Relative: 3 % (ref 0.0–5.0)
HEMATOCRIT: 41.8 % (ref 36.0–46.0)
Hemoglobin: 14 g/dL (ref 12.0–15.0)
LYMPHS ABS: 3.4 10*3/uL (ref 0.7–4.0)
Lymphocytes Relative: 38.2 % (ref 12.0–46.0)
MCHC: 33.4 g/dL (ref 30.0–36.0)
MCV: 88.3 fl (ref 78.0–100.0)
MONOS PCT: 4.1 % (ref 3.0–12.0)
Monocytes Absolute: 0.4 10*3/uL (ref 0.1–1.0)
Neutro Abs: 4.8 10*3/uL (ref 1.4–7.7)
Neutrophils Relative %: 54.1 % (ref 43.0–77.0)
Platelets: 283 10*3/uL (ref 150.0–400.0)
RBC: 4.74 Mil/uL (ref 3.87–5.11)
RDW: 12.3 % (ref 11.5–14.6)
WBC: 8.8 10*3/uL (ref 4.5–10.5)

## 2013-11-27 LAB — LIPID PANEL
Cholesterol: 169 mg/dL (ref 0–200)
HDL: 42.5 mg/dL (ref >39.00)
LDL Cholesterol: 84 mg/dL (ref 0–99)
Total CHOL/HDL Ratio: 4
Triglycerides: 213 mg/dL — ABNORMAL HIGH (ref 0.0–149.0)
VLDL: 42.6 mg/dL — AB (ref 0.0–40.0)

## 2013-11-27 LAB — URINALYSIS
BILIRUBIN URINE: NEGATIVE
HGB URINE DIPSTICK: NEGATIVE
LEUKOCYTES UA: NEGATIVE
Nitrite: NEGATIVE
Total Protein, Urine: NEGATIVE
UROBILINOGEN UA: 0.2 (ref 0.0–1.0)
Urine Glucose: NEGATIVE
pH: 5 (ref 5.0–8.0)

## 2013-11-27 LAB — BASIC METABOLIC PANEL
BUN: 10 mg/dL (ref 6–23)
CO2: 23 meq/L (ref 19–32)
Calcium: 9 mg/dL (ref 8.4–10.5)
Chloride: 105 mEq/L (ref 96–112)
Creatinine, Ser: 0.7 mg/dL (ref 0.4–1.2)
GFR: 90.1 mL/min (ref >60.00)
Glucose, Bld: 154 mg/dL — ABNORMAL HIGH (ref 70–99)
POTASSIUM: 4 meq/L (ref 3.5–5.1)
Sodium: 137 mEq/L (ref 135–145)

## 2013-11-27 LAB — HEPATIC FUNCTION PANEL
ALBUMIN: 3.7 g/dL (ref 3.5–5.2)
ALK PHOS: 49 U/L (ref 39–117)
ALT: 26 U/L (ref 0–35)
AST: 22 U/L (ref 0–37)
Bilirubin, Direct: 0.1 mg/dL (ref 0.0–0.3)
TOTAL PROTEIN: 6.6 g/dL (ref 6.0–8.3)
Total Bilirubin: 0.7 mg/dL (ref 0.3–1.2)

## 2013-11-27 LAB — HEMOGLOBIN A1C: HEMOGLOBIN A1C: 8.1 % — AB (ref 4.6–6.5)

## 2013-11-27 MED ORDER — HYDROCODONE-ACETAMINOPHEN 10-325 MG PO TABS: 1.0000 | ORAL_TABLET | Freq: Four times a day (QID) | ORAL | Status: DC | PRN

## 2013-11-27 MED ORDER — MOMETASONE FURO-FORMOTEROL FUM 200-5 MCG/ACT IN AERO: 2.0000 | INHALATION_SPRAY | Freq: Two times a day (BID) | RESPIRATORY_TRACT | Status: DC

## 2013-11-27 MED ORDER — URIBEL 118 MG PO CAPS: ORAL_CAPSULE | ORAL | Status: DC

## 2013-11-27 MED ORDER — NITROFURANTOIN MONOHYD MACRO 100 MG PO CAPS: 100.0000 mg | ORAL_CAPSULE | Freq: Two times a day (BID) | ORAL | Status: DC

## 2013-11-27 NOTE — Progress Notes (Signed)
   Subjective:      HPI.    C/o another UTI, sx's x few days  She has chronic depression, anxiety, headaches and chronic moderate fibromyalgia/LBP symptoms controlled with medicines. F/u chronic GI complaints.  She is out of [facility]. I have only Breo samples   Wt Readings from Last 3 Encounters:  11/27/13 250 lb (113.399 kg)  08/25/13 245 lb (111.131 kg)  07/18/13 243 lb 6.4 oz (110.406 kg)   BP Readings from Last 3 Encounters:  11/27/13 118/84  08/25/13 118/90  07/18/13 120/82     Review of Systems  Constitutional: Negative for activity change, appetite change and unexpected weight change.  HENT: Negative for mouth sores.   Eyes: Negative for visual disturbance.  Respiratory: Negative for chest tightness.   Genitourinary: Negative for frequency, difficulty urinating and vaginal pain.  Musculoskeletal: Positive for arthralgias. Negative for gait problem.  Skin: Negative for pallor.  Neurological: Negative for tremors.  Psychiatric/Behavioral: Positive for decreased concentration. Negative for suicidal ideas, confusion and sleep disturbance. The patient is nervous/anxious.        Objective:   Physical Exam  Constitutional: She appears well-developed. No distress.  Obese  HENT:  Head: Normocephalic.  Right Ear: External ear normal.  Left Ear: External ear normal.  Nose: Nose normal.  Mouth/Throat: Oropharynx is clear and moist.  Eyes: Conjunctivae are normal. Pupils are equal, round, and reactive to light. Right eye exhibits no discharge. Left eye exhibits no discharge.  Neck: Normal range of motion. Neck supple. No JVD present. No tracheal deviation present. No thyromegaly present.  Cardiovascular: Normal rate, regular rhythm and normal heart sounds.   Pulmonary/Chest: No stridor. No respiratory distress. She has wheezes. She has rales.  Abdominal: Soft. Bowel sounds are normal. She exhibits no distension and no mass. There is no tenderness. There is no rebound  and no guarding.  Musculoskeletal: She exhibits no edema and no tenderness.  Lymphadenopathy:    She has no cervical adenopathy.  Neurological: She displays normal reflexes. No cranial nerve deficit. She exhibits normal muscle tone. Coordination normal.  Skin: No rash noted. No erythema.  Psychiatric: Her behavior is normal. Judgment and thought content normal.  depressed    Lab Results  Component Value Date   WBC 9.5 02/15/2012   HGB 12.2 02/15/2012   HCT 35.3* 02/15/2012   PLT 228 02/15/2012   GLUCOSE 161* 05/28/2013   CHOL 167 07/26/2010   TRIG 332.0* 07/26/2010   HDL 43.10 07/26/2010   LDLDIRECT 80.3 07/26/2010   ALT 20 05/28/2013   AST 17 05/28/2013   NA 136 05/28/2013   K 3.6 05/28/2013   CL 103 05/28/2013   CREATININE 0.7 05/28/2013   BUN 13 05/28/2013   CO2 25 05/28/2013   TSH 2.34 05/28/2013   HGBA1C 7.6* 05/28/2013   MICROALBUR 0.4 07/26/2010    I personally provided Breo inhaler use teaching. After the teaching patient was able to demonstrate it's use effectively. All questions were answered       Assessment & Plan:

## 2013-11-27 NOTE — Assessment & Plan Note (Signed)
UA, Cx  Macrobid x 10 d Uribel prn

## 2013-11-27 NOTE — Assessment & Plan Note (Signed)
Continue with current prescription therapy as reflected on the Med list.  

## 2013-11-27 NOTE — Assessment & Plan Note (Signed)
Continue with current prescription therapy as reflected on the Med list. Breo samples: 1 qd for now (No Dulera Rx at home)

## 2013-11-27 NOTE — Progress Notes (Signed)
Pre visit review using our clinic review tool, if applicable. No additional management support is needed unless otherwise documented below in the visit note. 

## 2013-11-28 ENCOUNTER — Ambulatory Visit: Payer: Medicare Other | Admitting: Internal Medicine

## 2013-11-28 LAB — CULTURE, URINE COMPREHENSIVE
Colony Count: NO GROWTH
ORGANISM ID, BACTERIA: NO GROWTH

## 2013-12-12 ENCOUNTER — Telehealth: Payer: Self-pay

## 2013-12-12 MED ORDER — HYDROXYZINE PAMOATE 50 MG PO CAPS: ORAL_CAPSULE | ORAL | Status: DC

## 2013-12-12 NOTE — Telephone Encounter (Signed)
Done. Thx.

## 2013-12-12 NOTE — Telephone Encounter (Signed)
Patient requesting refill for hydroxyzine sent to [facility]. Thanks

## 2013-12-22 ENCOUNTER — Telehealth: Payer: Self-pay

## 2013-12-22 NOTE — Telephone Encounter (Signed)
Phone call from [facility] [phone] requesting verification of frequency/directions on Hydroxyzine 50 mg. Current directions are to take 2 tablets by mouth as needed for insomnia. Is this 2 tablets nightly? Please advise.

## 2013-12-23 MED ORDER — HYDROXYZINE PAMOATE 50 MG PO CAPS: ORAL_CAPSULE | ORAL | Status: AC

## 2013-12-23 NOTE — Telephone Encounter (Signed)
Yes. Thx.

## 2013-12-23 NOTE — Telephone Encounter (Signed)
I called [facility] and spoke to Quillian Quince and let him know directions should be take 2 tablets by mouth at bedtime as needed. The script was sent for qty 120 instead of qty 180 for a 90 day supply. I let Quillian Quince know it is okay to change to qty 180 with 2 refills.

## 2013-12-24 ENCOUNTER — Telehealth: Payer: Self-pay | Admitting: *Deleted

## 2013-12-24 ENCOUNTER — Other Ambulatory Visit: Payer: Self-pay | Admitting: Internal Medicine

## 2013-12-24 NOTE — Telephone Encounter (Signed)
Spoke with pt, scheduled for 4.2.15 @ 11:15

## 2013-12-24 NOTE — Telephone Encounter (Signed)
Pt called states she is having left breast pain and swelling.  She is requesting a referral to the breast center for a Diagnostic mammogram.  Please advise

## 2013-12-24 NOTE — Telephone Encounter (Signed)
She will need an OV for breast exam prior Thx

## 2013-12-25 ENCOUNTER — Encounter: Payer: Self-pay | Admitting: Internal Medicine

## 2013-12-25 ENCOUNTER — Ambulatory Visit (INDEPENDENT_AMBULATORY_CARE_PROVIDER_SITE_OTHER): Payer: Medicare Other | Admitting: Internal Medicine

## 2013-12-25 VITALS — BP 168/92 | HR 76 | Temp 97.0°F | Resp 16

## 2013-12-25 DIAGNOSIS — J45909 Unspecified asthma, uncomplicated: Secondary | ICD-10-CM

## 2013-12-25 DIAGNOSIS — N644 Mastodynia: Secondary | ICD-10-CM

## 2013-12-25 NOTE — Progress Notes (Signed)
Pre visit review using our clinic review tool, if applicable. No additional management support is needed unless otherwise documented below in the visit note. 

## 2013-12-25 NOTE — Assessment & Plan Note (Signed)
[place] Mammo Vit E

## 2013-12-28 ENCOUNTER — Encounter: Payer: Self-pay | Admitting: Internal Medicine

## 2013-12-28 NOTE — Progress Notes (Signed)
   Subjective:      HPI.    C/o L breast pain sx's x few days  She has chronic depression, anxiety, headaches and chronic moderate fibromyalgia/LBP symptoms controlled with medicines. F/u chronic GI complaints.  She is out of [facility]. I have only Breo samples   Wt Readings from Last 3 Encounters:  11/27/13 250 lb (113.399 kg)  08/25/13 245 lb (111.131 kg)  07/18/13 243 lb 6.4 oz (110.406 kg)   BP Readings from Last 3 Encounters:  12/25/13 168/92  11/27/13 118/84  08/25/13 118/90     Review of Systems  Constitutional: Negative for activity change, appetite change and unexpected weight change.  HENT: Negative for mouth sores.   Eyes: Negative for visual disturbance.  Respiratory: Negative for chest tightness.   Genitourinary: Negative for frequency, difficulty urinating and vaginal pain.  Musculoskeletal: Positive for arthralgias. Negative for gait problem.  Skin: Negative for pallor.  Neurological: Negative for tremors.  Psychiatric/Behavioral: Positive for decreased concentration. Negative for suicidal ideas, confusion and sleep disturbance. The patient is nervous/anxious.        Objective:   Physical Exam  Constitutional: She appears well-developed. No distress.  Obese  HENT:  Head: Normocephalic.  Right Ear: External ear normal.  Left Ear: External ear normal.  Nose: Nose normal.  Mouth/Throat: Oropharynx is clear and moist.  Eyes: Conjunctivae are normal. Pupils are equal, round, and reactive to light. Right eye exhibits no discharge. Left eye exhibits no discharge.  Neck: Normal range of motion. Neck supple. No JVD present. No tracheal deviation present. No thyromegaly present.  Cardiovascular: Normal rate, regular rhythm and normal heart sounds.   Pulmonary/Chest: No stridor. No respiratory distress. She has wheezes. She has rales.  Abdominal: Soft. Bowel sounds are normal. She exhibits no distension and no mass. There is no tenderness. There is no rebound  and no guarding.  Musculoskeletal: She exhibits no edema and no tenderness.  Lymphadenopathy:    She has no cervical adenopathy.  Neurological: She displays normal reflexes. No cranial nerve deficit. She exhibits normal muscle tone. Coordination normal.  Skin: No rash noted. No erythema.  Psychiatric: Her behavior is normal. Judgment and thought content normal.  depressed    Lab Results  Component Value Date   WBC 8.8 11/27/2013   HGB 14.0 11/27/2013   HCT 41.8 11/27/2013   PLT 283.0 11/27/2013   GLUCOSE 154* 11/27/2013   CHOL 169 11/27/2013   TRIG 213.0* 11/27/2013   HDL 42.50 11/27/2013   LDLDIRECT 80.3 07/26/2010   LDLCALC 84 11/27/2013   ALT 26 11/27/2013   AST 22 11/27/2013   NA 137 11/27/2013   K 4.0 11/27/2013   CL 105 11/27/2013   CREATININE 0.7 11/27/2013   BUN 10 11/27/2013   CO2 23 11/27/2013   TSH 2.34 05/28/2013   HGBA1C 8.1* 11/27/2013   MICROALBUR 0.4 07/26/2010    I personally provided Breo inhaler use teaching. After the teaching patient was able to demonstrate it's use effectively. All questions were answered       Assessment & Plan:

## 2013-12-28 NOTE — Assessment & Plan Note (Addendum)
Needs MDI samples: She is out of [place]. I have only Breo samples Breo samples given/demo

## 2014-01-14 ENCOUNTER — Ambulatory Visit
Admission: RE | Admit: 2014-01-14 | Discharge: 2014-01-14 | Disposition: A | Discharge status: Home / Self Care | Payer: Medicare Other | Source: Ambulatory Visit | Attending: Internal Medicine | Admitting: Internal Medicine

## 2014-01-14 DIAGNOSIS — N644 Mastodynia: Secondary | ICD-10-CM

## 2014-01-16 ENCOUNTER — Other Ambulatory Visit: Payer: Self-pay | Admitting: Internal Medicine

## 2014-01-18 ENCOUNTER — Other Ambulatory Visit: Payer: Self-pay | Admitting: Internal Medicine

## 2014-01-21 ENCOUNTER — Other Ambulatory Visit: Payer: Self-pay | Admitting: Internal Medicine

## 2014-01-26 ENCOUNTER — Telehealth: Payer: Self-pay | Admitting: *Deleted

## 2014-01-26 NOTE — Telephone Encounter (Signed)
Pt called requesting Hydrocodone refills.  Last filled 4.5.15.  Please advise

## 2014-01-26 NOTE — Telephone Encounter (Signed)
OK to fill this prescription with additional refills x0 OV q 3 mo Thank you!  

## 2014-01-27 ENCOUNTER — Other Ambulatory Visit: Payer: Self-pay | Admitting: *Deleted

## 2014-01-27 MED ORDER — HYDROCODONE-ACETAMINOPHEN 10-325 MG PO TABS: 1.0000 | ORAL_TABLET | Freq: Four times a day (QID) | ORAL | Status: DC | PRN

## 2014-01-27 NOTE — Telephone Encounter (Signed)
Spoke with pt advised Rx ready for pick up 

## 2014-01-28 ENCOUNTER — Other Ambulatory Visit: Payer: Self-pay | Admitting: Internal Medicine

## 2014-01-29 NOTE — Telephone Encounter (Signed)
Pt's refill went to [facility] for some reason.  She has been trying to get this all week for IBS.  She is out.  Please send it to [facility] on [address].  She gets 90.

## 2014-01-29 NOTE — Telephone Encounter (Signed)
Looks like patient is now seeing you as PCP--please advise if okay to refill--last filled by [facility] Sil[facility]mith 01/2013

## 2014-01-30 ENCOUNTER — Other Ambulatory Visit: Payer: Self-pay | Admitting: Internal Medicine

## 2014-02-04 ENCOUNTER — Other Ambulatory Visit: Payer: Self-pay | Admitting: Internal Medicine

## 2014-02-08 ENCOUNTER — Other Ambulatory Visit: Payer: Self-pay | Admitting: Internal Medicine

## 2014-03-04 ENCOUNTER — Ambulatory Visit (INDEPENDENT_AMBULATORY_CARE_PROVIDER_SITE_OTHER): Payer: Medicare Other | Admitting: Internal Medicine

## 2014-03-04 ENCOUNTER — Encounter: Payer: Self-pay | Admitting: Internal Medicine

## 2014-03-04 ENCOUNTER — Other Ambulatory Visit (INDEPENDENT_AMBULATORY_CARE_PROVIDER_SITE_OTHER): Payer: Medicare Other

## 2014-03-04 VITALS — BP 140/92 | HR 80 | Temp 98.2°F | Resp 16 | Wt 252.0 lb

## 2014-03-04 DIAGNOSIS — F329 Major depressive disorder, single episode, unspecified: Secondary | ICD-10-CM

## 2014-03-04 DIAGNOSIS — E119 Type 2 diabetes mellitus without complications: Secondary | ICD-10-CM

## 2014-03-04 DIAGNOSIS — K219 Gastro-esophageal reflux disease without esophagitis: Secondary | ICD-10-CM

## 2014-03-04 DIAGNOSIS — G609 Hereditary and idiopathic neuropathy, unspecified: Secondary | ICD-10-CM

## 2014-03-04 DIAGNOSIS — N390 Urinary tract infection, site not specified: Secondary | ICD-10-CM

## 2014-03-04 DIAGNOSIS — F3289 Other specified depressive episodes: Secondary | ICD-10-CM

## 2014-03-04 DIAGNOSIS — IMO0001 Reserved for inherently not codable concepts without codable children: Secondary | ICD-10-CM

## 2014-03-04 LAB — HEPATIC FUNCTION PANEL
ALBUMIN: 3.7 g/dL (ref 3.5–5.2)
ALK PHOS: 58 U/L (ref 39–117)
ALT: 19 U/L (ref 0–35)
AST: 21 U/L (ref 0–37)
BILIRUBIN DIRECT: 0.1 mg/dL (ref 0.0–0.3)
TOTAL PROTEIN: 6.7 g/dL (ref 6.0–8.3)
Total Bilirubin: 0.4 mg/dL (ref 0.2–1.2)

## 2014-03-04 LAB — BASIC METABOLIC PANEL
BUN: 9 mg/dL (ref 6–23)
CALCIUM: 9 mg/dL (ref 8.4–10.5)
CO2: 24 mEq/L (ref 19–32)
CREATININE: 0.8 mg/dL (ref 0.4–1.2)
Chloride: 105 mEq/L (ref 96–112)
GFR: 78.43 mL/min (ref >60.00)
Glucose, Bld: 209 mg/dL — ABNORMAL HIGH (ref 70–99)
Potassium: 3.6 mEq/L (ref 3.5–5.1)
Sodium: 139 mEq/L (ref 135–145)

## 2014-03-04 LAB — HEMOGLOBIN A1C: Hgb A1c MFr Bld: 8.6 % — ABNORMAL HIGH (ref 4.6–6.5)

## 2014-03-04 MED ORDER — HYDROCODONE-ACETAMINOPHEN 10-325 MG PO TABS: 1.0000 | ORAL_TABLET | Freq: Four times a day (QID) | ORAL | Status: DC | PRN

## 2014-03-04 MED ORDER — PROMETHAZINE-CODEINE 6.25-10 MG/5ML PO SYRP: 5.0000 mL | ORAL_SOLUTION | ORAL | Status: DC | PRN

## 2014-03-04 NOTE — Assessment & Plan Note (Signed)
Continue with current prescription therapy as reflected on the Med list.  

## 2014-03-04 NOTE — Assessment & Plan Note (Signed)
Wt Readings from Last 3 Encounters:  03/04/14 252 lb (114.306 kg)  11/27/13 250 lb (113.399 kg)  08/25/13 245 lb (111.131 kg)

## 2014-03-04 NOTE — Progress Notes (Signed)
Pre visit review using our clinic review tool, if applicable. No additional management support is needed unless otherwise documented below in the visit note. 

## 2014-03-04 NOTE — Progress Notes (Signed)
   Subjective:      HPI.      She has chronic depression, anxiety, headaches and chronic moderate fibromyalgia/LBP symptoms controlled with medicines. F/u chronic GI complaints.  She is out of [facility]. I have only Breo samples  C/o chest tightness at the bottom from coughing at times - a muscle relaxant helps   Wt Readings from Last 3 Encounters:  03/04/14 252 lb (114.306 kg)  11/27/13 250 lb (113.399 kg)  08/25/13 245 lb (111.131 kg)   BP Readings from Last 3 Encounters:  03/04/14 140/92  12/25/13 168/92  11/27/13 118/84     Review of Systems  Constitutional: Negative for activity change, appetite change and unexpected weight change.  HENT: Negative for mouth sores.   Eyes: Negative for visual disturbance.  Respiratory: Negative for chest tightness.   Genitourinary: Negative for frequency, difficulty urinating and vaginal pain.  Musculoskeletal: Positive for arthralgias. Negative for gait problem.  Skin: Negative for pallor.  Neurological: Negative for tremors.  Psychiatric/Behavioral: Positive for decreased concentration. Negative for suicidal ideas, confusion and sleep disturbance. The patient is nervous/anxious.        Objective:   Physical Exam  Constitutional: She appears well-developed. No distress.  Obese  HENT:  Head: Normocephalic.  Right Ear: External ear normal.  Left Ear: External ear normal.  Nose: Nose normal.  Mouth/Throat: Oropharynx is clear and moist.  Eyes: Conjunctivae are normal. Pupils are equal, round, and reactive to light. Right eye exhibits no discharge. Left eye exhibits no discharge.  Neck: Normal range of motion. Neck supple. No JVD present. No tracheal deviation present. No thyromegaly present.  Cardiovascular: Normal rate, regular rhythm and normal heart sounds.   Pulmonary/Chest: No stridor. No respiratory distress. She has wheezes. She has rales.  Abdominal: Soft. Bowel sounds are normal. She exhibits no distension and no mass.  There is no tenderness. There is no rebound and no guarding.  Musculoskeletal: She exhibits no edema and no tenderness.  Lymphadenopathy:    She has no cervical adenopathy.  Neurological: She displays normal reflexes. No cranial nerve deficit. She exhibits normal muscle tone. Coordination normal.  Skin: No rash noted. No erythema.  Psychiatric: Her behavior is normal. Judgment and thought content normal.  depressed    Lab Results  Component Value Date   WBC 8.8 11/27/2013   HGB 14.0 11/27/2013   HCT 41.8 11/27/2013   PLT 283.0 11/27/2013   GLUCOSE 154* 11/27/2013   CHOL 169 11/27/2013   TRIG 213.0* 11/27/2013   HDL 42.50 11/27/2013   LDLDIRECT 80.3 07/26/2010   LDLCALC 84 11/27/2013   ALT 26 11/27/2013   AST 22 11/27/2013   NA 137 11/27/2013   K 4.0 11/27/2013   CL 105 11/27/2013   CREATININE 0.7 11/27/2013   BUN 10 11/27/2013   CO2 23 11/27/2013   TSH 2.34 05/28/2013   HGBA1C 8.1* 11/27/2013   MICROALBUR 0.4 07/26/2010        Assessment & Plan:

## 2014-03-30 ENCOUNTER — Telehealth: Payer: Self-pay

## 2014-03-30 DIAGNOSIS — E119 Type 2 diabetes mellitus without complications: Secondary | ICD-10-CM

## 2014-03-30 NOTE — Telephone Encounter (Signed)
Diabetic bundle-bmet and a1c ordered

## 2014-04-20 ENCOUNTER — Other Ambulatory Visit: Payer: Self-pay | Admitting: Internal Medicine

## 2014-04-20 NOTE — Telephone Encounter (Signed)
Ok to refill 30d, no refill (per protocol covering for absent PCP) - prescription printed and signed - placed on triage desk

## 2014-04-20 NOTE — Telephone Encounter (Signed)
[facility].../lmb 

## 2014-04-21 ENCOUNTER — Ambulatory Visit: Payer: Medicare Other | Admitting: Family Medicine

## 2014-04-29 ENCOUNTER — Ambulatory Visit: Payer: Medicare Other | Admitting: Internal Medicine

## 2014-04-30 ENCOUNTER — Telehealth: Payer: Self-pay | Admitting: Internal Medicine

## 2014-04-30 NOTE — Telephone Encounter (Signed)
Patient no showed for acute ov with Dr. Jenny Reichmann 08/05.  Next appt with Dr. Camila Li is 9/16.  Please advise.

## 2014-05-01 ENCOUNTER — Ambulatory Visit (INDEPENDENT_AMBULATORY_CARE_PROVIDER_SITE_OTHER)
Admission: RE | Admit: 2014-05-01 | Discharge: 2014-05-01 | Disposition: A | Discharge status: Home / Self Care | Payer: Medicare Other | Source: Ambulatory Visit | Attending: Internal Medicine | Admitting: Internal Medicine

## 2014-05-01 ENCOUNTER — Ambulatory Visit (INDEPENDENT_AMBULATORY_CARE_PROVIDER_SITE_OTHER): Payer: Medicare Other | Admitting: Internal Medicine

## 2014-05-01 ENCOUNTER — Encounter: Payer: Self-pay | Admitting: Internal Medicine

## 2014-05-01 VITALS — BP 168/85 | HR 107 | Temp 98.1°F | Resp 20 | Ht 65.0 in | Wt 249.5 lb

## 2014-05-01 DIAGNOSIS — R059 Cough, unspecified: Secondary | ICD-10-CM

## 2014-05-01 DIAGNOSIS — E1165 Type 2 diabetes mellitus with hyperglycemia: Secondary | ICD-10-CM

## 2014-05-01 DIAGNOSIS — E119 Type 2 diabetes mellitus without complications: Secondary | ICD-10-CM

## 2014-05-01 DIAGNOSIS — R05 Cough: Secondary | ICD-10-CM

## 2014-05-01 DIAGNOSIS — J441 Chronic obstructive pulmonary disease with (acute) exacerbation: Secondary | ICD-10-CM

## 2014-05-01 DIAGNOSIS — IMO0001 Reserved for inherently not codable concepts without codable children: Secondary | ICD-10-CM

## 2014-05-01 MED ORDER — METHYLPREDNISOLONE ACETATE PF 80 MG/ML IJ SUSP
120.0000 mg | Freq: Once | INTRAMUSCULAR | Status: AC
  Administered 2014-05-01: 120 mg via INTRAMUSCULAR

## 2014-05-01 MED ORDER — INSULIN DETEMIR 100 UNIT/ML FLEXPEN: 30.0000 [IU] | PEN_INJECTOR | Freq: Every day | SUBCUTANEOUS | Status: DC

## 2014-05-01 MED ORDER — LEVOFLOXACIN 500 MG PO TABS: 500.0000 mg | ORAL_TABLET | Freq: Every day | ORAL | Status: DC

## 2014-05-01 NOTE — Patient Instructions (Signed)

## 2014-05-01 NOTE — Assessment & Plan Note (Signed)
Her blood sugars are not well controlled so I have asked her to start levemir She was given samples and her first dose in the office today She needs to go for diabetic education as well

## 2014-05-01 NOTE — Telephone Encounter (Signed)
Pt came in and made appt to see Dr Ronnald Ramp has appt today 05/01/14...[name]

## 2014-05-01 NOTE — Assessment & Plan Note (Signed)
She is having a flare up so I gave her an injection of depo-medrol IM Her CXR shows an area of PNA, will treat with levaquin She will cont all of her other meds as needed

## 2014-05-01 NOTE — Progress Notes (Signed)
   Subjective:    Patient ID: [name], female    DOB: [birth year], 58 y.o.   MRN: [id number]  Cough This is a recurrent problem. The current episode started 1 to 4 weeks ago. The problem has been gradually improving. The problem occurs every few hours. The cough is productive of purulent sputum. Associated symptoms include shortness of breath and wheezing. Pertinent negatives include no chest pain, chills, ear congestion, ear pain, fever, headaches, heartburn, hemoptysis, myalgias, nasal congestion, postnasal drip, rash, rhinorrhea, sore throat, sweats or weight loss. Nothing aggravates the symptoms. She has tried prescription cough suppressant, steroid inhaler and a beta-agonist inhaler for the symptoms. The treatment provided mild relief. Her past medical history is significant for COPD and pneumonia. There is no history of asthma, bronchiectasis, bronchitis or environmental allergies.      Review of Systems  Constitutional: Negative.  Negative for fever, chills, weight loss, diaphoresis, appetite change and fatigue.  HENT: Negative.  Negative for ear pain, postnasal drip, rhinorrhea and sore throat.   Eyes: Negative.   Respiratory: Positive for cough, shortness of breath and wheezing. Negative for apnea, hemoptysis, choking, chest tightness and stridor.   Cardiovascular: Negative.  Negative for chest pain, palpitations and leg swelling.  Gastrointestinal: Negative.  Negative for heartburn, nausea, vomiting, abdominal pain, diarrhea, constipation and blood in stool.  Endocrine: Positive for polydipsia, polyphagia and polyuria.  Genitourinary: Negative.   Musculoskeletal: Negative.  Negative for arthralgias, back pain and myalgias.  Skin: Negative.  Negative for rash.  Allergic/Immunologic: Negative.  Negative for environmental allergies.  Neurological: Negative.  Negative for dizziness, tremors, speech difficulty, weakness, light-headedness, numbness and headaches.  Hematological:  Negative.  Negative for adenopathy. Does not bruise/bleed easily.  Psychiatric/Behavioral: Negative.        Objective:   Physical Exam  Vitals reviewed. Constitutional: She is oriented to person, place, and time. She appears well-developed and well-nourished.  Non-toxic appearance. She does not have a sickly appearance. She does not appear ill. No distress.  HENT:  Head: Normocephalic and atraumatic.  Mouth/Throat: Oropharynx is clear and moist. No oropharyngeal exudate.  Eyes: Conjunctivae are normal. Right eye exhibits no discharge. Left eye exhibits no discharge. No scleral icterus.  Neck: Normal range of motion. Neck supple. No JVD present. No tracheal deviation present. No thyromegaly present.  Cardiovascular: Normal rate, regular rhythm, normal heart sounds and intact distal pulses.  Exam reveals no gallop and no friction rub.   No murmur heard. Pulmonary/Chest: No accessory muscle usage or stridor. Tachypnea noted. No respiratory distress. She has no decreased breath sounds. She has wheezes in the right middle field, the right lower field, the left middle field and the left lower field. She has rhonchi in the right middle field and the left middle field. She has no rales. She exhibits no tenderness.  Abdominal: Soft. Bowel sounds are normal. She exhibits no distension and no mass. There is no tenderness. There is no rebound and no guarding.  Musculoskeletal: Normal range of motion. She exhibits no edema and no tenderness.  Lymphadenopathy:    She has no cervical adenopathy.  Neurological: She is oriented to person, place, and time.  Skin: Skin is warm and dry. No rash noted. She is not diaphoretic. No erythema. No pallor.  Psychiatric: She has a normal mood and affect. Her behavior is normal. Judgment and thought content normal.          Assessment & Plan:

## 2014-05-01 NOTE — Progress Notes (Signed)
Pre visit review using our clinic review tool, if applicable. No additional management support is needed unless otherwise documented below in the visit note. 

## 2014-05-07 ENCOUNTER — Other Ambulatory Visit: Payer: Self-pay | Admitting: *Deleted

## 2014-05-07 MED ORDER — FLUTICASONE-SALMETEROL 250-50 MCG/DOSE IN AEPB: 1.0000 | INHALATION_SPRAY | Freq: Two times a day (BID) | RESPIRATORY_TRACT | Status: DC

## 2014-05-08 ENCOUNTER — Encounter: Payer: Self-pay | Admitting: Internal Medicine

## 2014-05-08 ENCOUNTER — Ambulatory Visit (INDEPENDENT_AMBULATORY_CARE_PROVIDER_SITE_OTHER): Payer: Medicare Other | Admitting: Internal Medicine

## 2014-05-08 VITALS — BP 154/98 | HR 88 | Temp 98.1°F | Resp 16

## 2014-05-08 DIAGNOSIS — E1165 Type 2 diabetes mellitus with hyperglycemia: Principal | ICD-10-CM

## 2014-05-08 DIAGNOSIS — J45909 Unspecified asthma, uncomplicated: Secondary | ICD-10-CM

## 2014-05-08 DIAGNOSIS — F329 Major depressive disorder, single episode, unspecified: Secondary | ICD-10-CM

## 2014-05-08 DIAGNOSIS — J189 Pneumonia, unspecified organism: Secondary | ICD-10-CM

## 2014-05-08 DIAGNOSIS — IMO0001 Reserved for inherently not codable concepts without codable children: Secondary | ICD-10-CM

## 2014-05-08 DIAGNOSIS — G43519 Persistent migraine aura without cerebral infarction, intractable, without status migrainosus: Secondary | ICD-10-CM

## 2014-05-08 DIAGNOSIS — F3289 Other specified depressive episodes: Secondary | ICD-10-CM

## 2014-05-08 DIAGNOSIS — R51 Headache: Secondary | ICD-10-CM

## 2014-05-08 MED ORDER — DOXYCYCLINE HYCLATE 100 MG PO TABS: 100.0000 mg | ORAL_TABLET | Freq: Two times a day (BID) | ORAL | Status: DC

## 2014-05-08 MED ORDER — "INSULIN SYRINGE-NEEDLE U-100 27.5G X 5/8"" 2 ML MISC": Status: AC

## 2014-05-08 MED ORDER — CYCLOBENZAPRINE HCL 10 MG PO TABS: ORAL_TABLET | ORAL | Status: DC

## 2014-05-08 MED ORDER — INSULIN DETEMIR 100 UNIT/ML FLEXPEN: PEN_INJECTOR | SUBCUTANEOUS | Status: DC

## 2014-05-08 MED ORDER — PROMETHAZINE HCL 25 MG/ML IJ SOLN
50.0000 mg | Freq: Once | INTRAMUSCULAR | Status: AC
  Administered 2014-05-08: 50 mg via INTRAMUSCULAR

## 2014-05-08 MED ORDER — MEPERIDINE HCL 50 MG/ML IJ SOLN
50.0000 mg | Freq: Once | INTRAMUSCULAR | Status: AC
  Administered 2014-05-08: 50 mg via INTRAMUSCULAR

## 2014-05-08 NOTE — Assessment & Plan Note (Signed)
Continue with current prescription therapy as reflected on the Med list.  

## 2014-05-08 NOTE — Progress Notes (Signed)
Subjective:      Cough This is a new problem. The current episode started 1 to 4 weeks ago (3 weeks). The problem has been gradually worsening. The problem occurs constantly. The cough is productive of purulent sputum. Associated symptoms include chest pain, chills, headaches, shortness of breath and wheezing. Pertinent negatives include no ear congestion, fever, heartburn or rash. She has tried OTC cough suppressant (Levaquin x 7 d) for the symptoms. The treatment provided no relief. Her past medical history is significant for asthma.  .      She has chronic depression, anxiety, headaches and chronic moderate fibromyalgia/LBP symptoms controlled with medicines. F/u chronic GI complaints.  She is on [place].     Wt Readings from Last 3 Encounters:  05/01/14 249 lb 8 oz (113.172 kg)  03/04/14 252 lb (114.306 kg)  11/27/13 250 lb (113.399 kg)   BP Readings from Last 3 Encounters:  05/08/14 154/98  05/01/14 168/85  03/04/14 140/92     Review of Systems  Constitutional: Positive for chills. Negative for fever, activity change, appetite change and unexpected weight change.  HENT: Negative for mouth sores.   Eyes: Negative for visual disturbance.  Respiratory: Positive for cough, shortness of breath and wheezing. Negative for chest tightness.   Cardiovascular: Positive for chest pain.  Gastrointestinal: Negative for heartburn.  Genitourinary: Negative for frequency, difficulty urinating and vaginal pain.  Musculoskeletal: Positive for arthralgias. Negative for gait problem.  Skin: Negative for pallor and rash.  Neurological: Positive for headaches. Negative for tremors.  Psychiatric/Behavioral: Positive for decreased concentration. Negative for suicidal ideas, confusion and sleep disturbance. The patient is nervous/anxious.        Objective:   Physical Exam  Constitutional: She appears well-developed. No distress.  Obese  HENT:  Head: Normocephalic.  Right Ear:  External ear normal.  Left Ear: External ear normal.  Nose: Nose normal.  Mouth/Throat: Oropharynx is clear and moist.  Eyes: Conjunctivae are normal. Pupils are equal, round, and reactive to light. Right eye exhibits no discharge. Left eye exhibits no discharge.  Neck: Normal range of motion. Neck supple. No JVD present. No tracheal deviation present. No thyromegaly present.  Cardiovascular: Normal rate, regular rhythm and normal heart sounds.   Pulmonary/Chest: No stridor. No respiratory distress. She has wheezes. She has rales.  Abdominal: Soft. Bowel sounds are normal. She exhibits no distension and no mass. There is no tenderness. There is no rebound and no guarding.  Musculoskeletal: She exhibits no edema and no tenderness.  Lymphadenopathy:    She has no cervical adenopathy.  Neurological: She displays normal reflexes. No cranial nerve deficit. She exhibits normal muscle tone. Coordination normal.  Skin: No rash noted. No erythema.  Psychiatric: Her behavior is normal. Judgment and thought content normal.  depressed    Lab Results  Component Value Date   WBC 8.8 11/27/2013   HGB 14.0 11/27/2013   HCT 41.8 11/27/2013   PLT 283.0 11/27/2013   GLUCOSE 209* 03/04/2014   CHOL 169 11/27/2013   TRIG 213.0* 11/27/2013   HDL 42.50 11/27/2013   LDLDIRECT 80.3 07/26/2010   LDLCALC 84 11/27/2013   ALT 19 03/04/2014   AST 21 03/04/2014   NA 139 03/04/2014   K 3.6 03/04/2014   CL 105 03/04/2014   CREATININE 0.8 03/04/2014   BUN 9 03/04/2014   CO2 24 03/04/2014   TSH 2.34 05/28/2013   HGBA1C 8.6* 03/04/2014   MICROALBUR 0.4 07/26/2010   CXR ?R PNA     Assessment &  Plan:

## 2014-05-08 NOTE — Assessment & Plan Note (Signed)
Using her dad's leftover Levemir - see titration schedule (discussed). Syringes Rx were given

## 2014-05-08 NOTE — Progress Notes (Signed)
Pre visit review using our clinic review tool, if applicable. No additional management support is needed unless otherwise documented below in the visit note. 

## 2014-05-10 DIAGNOSIS — R51 Headache: Secondary | ICD-10-CM | POA: Insufficient documentation

## 2014-05-10 DIAGNOSIS — R519 Headache, unspecified: Secondary | ICD-10-CM | POA: Insufficient documentation

## 2014-05-10 DIAGNOSIS — J189 Pneumonia, unspecified organism: Secondary | ICD-10-CM | POA: Insufficient documentation

## 2014-05-10 NOTE — Assessment & Plan Note (Signed)
Continue with current prescription therapy as reflected on the Med list.  

## 2014-05-10 NOTE — Assessment & Plan Note (Signed)
Refractory. Change abx to Doxy

## 2014-05-10 NOTE — Assessment & Plan Note (Signed)
8.15 severe migraine Dem/Phen IM  Potential benefits of a IM Demerol as well as potential risks (i.e. addiction risk, apnea etc) and complications (i.e. Somnolence and others) were explained to the patient and were aknowledged.

## 2014-05-14 ENCOUNTER — Other Ambulatory Visit: Payer: Self-pay | Admitting: Internal Medicine

## 2014-05-14 ENCOUNTER — Telehealth: Payer: Self-pay | Admitting: *Deleted

## 2014-05-14 NOTE — Telephone Encounter (Signed)
Pt's handicap placard application is complete and upfront for p/u. I left a detailed mess informing pt.

## 2014-05-21 ENCOUNTER — Encounter: Payer: Self-pay | Admitting: Internal Medicine

## 2014-05-21 ENCOUNTER — Ambulatory Visit (INDEPENDENT_AMBULATORY_CARE_PROVIDER_SITE_OTHER): Payer: Medicare Other | Admitting: Internal Medicine

## 2014-05-21 VITALS — BP 160/90 | HR 84 | Temp 98.2°F | Resp 16 | Wt 248.0 lb

## 2014-05-21 DIAGNOSIS — IMO0001 Reserved for inherently not codable concepts without codable children: Secondary | ICD-10-CM

## 2014-05-21 DIAGNOSIS — J029 Acute pharyngitis, unspecified: Secondary | ICD-10-CM

## 2014-05-21 DIAGNOSIS — E1165 Type 2 diabetes mellitus with hyperglycemia: Principal | ICD-10-CM

## 2014-05-21 DIAGNOSIS — J028 Acute pharyngitis due to other specified organisms: Secondary | ICD-10-CM

## 2014-05-21 DIAGNOSIS — J189 Pneumonia, unspecified organism: Secondary | ICD-10-CM

## 2014-05-21 DIAGNOSIS — J45909 Unspecified asthma, uncomplicated: Secondary | ICD-10-CM

## 2014-05-21 MED ORDER — PROMETHAZINE-CODEINE 6.25-10 MG/5ML PO SYRP: 5.0000 mL | ORAL_SOLUTION | ORAL | Status: DC | PRN

## 2014-05-21 MED ORDER — HYDROCODONE-ACETAMINOPHEN 10-325 MG PO TABS: 1.0000 | ORAL_TABLET | Freq: Four times a day (QID) | ORAL | Status: DC | PRN

## 2014-05-21 MED ORDER — METHYLPREDNISOLONE ACETATE 80 MG/ML IJ SUSP
80.0000 mg | Freq: Once | INTRAMUSCULAR | Status: AC
  Administered 2014-05-21: 80 mg via INTRAMUSCULAR

## 2014-05-21 MED ORDER — FLUCONAZOLE 100 MG PO TABS: ORAL_TABLET | ORAL | Status: DC

## 2014-05-21 NOTE — Assessment & Plan Note (Signed)
Better, not well Depomedrol 80 mg IM today

## 2014-05-21 NOTE — Progress Notes (Signed)
Patient ID: [name], female   DOB: Mar 19, 1956, 58 y.o.   MRN: [id number]   Subjective:      Cough This is a new problem. The current episode started 1 to 4 weeks ago (4 weeks). The problem has been gradually improving. The problem occurs hourly. The cough is productive of sputum. Associated symptoms include shortness of breath and wheezing. Pertinent negatives include no chest pain, chills, ear congestion, fever, headaches, heartburn or rash. She has tried OTC cough suppressant (Levaquin x 7 d, then Doxy) for the symptoms. The treatment provided no relief. Her past medical history is significant for asthma.  Marland Kitchen  CBGs are better - 120 fasting    She has chronic depression, anxiety, headaches and chronic moderate fibromyalgia/LBP symptoms controlled with medicines. F/u chronic GI complaints.  She is on [place].     Wt Readings from Last 3 Encounters:  05/21/14 248 lb (112.492 kg)  05/01/14 249 lb 8 oz (113.172 kg)  03/04/14 252 lb (114.306 kg)   BP Readings from Last 3 Encounters:  05/21/14 160/90  05/08/14 154/98  05/01/14 168/85     Review of Systems  Constitutional: Negative for fever, chills, activity change, appetite change and unexpected weight change.  HENT: Negative for mouth sores.   Eyes: Negative for visual disturbance.  Respiratory: Positive for cough, shortness of breath and wheezing. Negative for chest tightness.   Cardiovascular: Negative for chest pain.  Gastrointestinal: Negative for heartburn.  Genitourinary: Negative for frequency, difficulty urinating and vaginal pain.  Musculoskeletal: Positive for arthralgias. Negative for gait problem.  Skin: Negative for pallor and rash.  Neurological: Negative for tremors and headaches.  Psychiatric/Behavioral: Negative for suicidal ideas, confusion, sleep disturbance and decreased concentration. The patient is nervous/anxious.        Objective:   Physical Exam  Constitutional: She appears well-developed.  No distress.  Obese NAD  HENT:  Head: Normocephalic.  Right Ear: External ear normal.  Left Ear: External ear normal.  Nose: Nose normal.  Mouth/Throat: Oropharynx is clear and moist.  Eyes: Conjunctivae are normal. Pupils are equal, round, and reactive to light. Right eye exhibits no discharge. Left eye exhibits no discharge.  Neck: Normal range of motion. Neck supple. No JVD present. No tracheal deviation present. No thyromegaly present.  Cardiovascular: Normal rate, regular rhythm and normal heart sounds.   Pulmonary/Chest: No stridor. No respiratory distress. She has no wheezes. She has no rales.  Abdominal: Soft. Bowel sounds are normal. She exhibits no distension and no mass. There is no tenderness. There is no rebound and no guarding.  Musculoskeletal: She exhibits no edema and no tenderness.  Lymphadenopathy:    She has no cervical adenopathy.  Neurological: She displays normal reflexes. No cranial nerve deficit. She exhibits normal muscle tone. Coordination normal.  Skin: No rash noted. No erythema.  Psychiatric: Her behavior is normal. Judgment and thought content normal.  depressed    Lab Results  Component Value Date   WBC 8.8 11/27/2013   HGB 14.0 11/27/2013   HCT 41.8 11/27/2013   PLT 283.0 11/27/2013   GLUCOSE 209* 03/04/2014   CHOL 169 11/27/2013   TRIG 213.0* 11/27/2013   HDL 42.50 11/27/2013   LDLDIRECT 80.3 07/26/2010   LDLCALC 84 11/27/2013   ALT 19 03/04/2014   AST 21 03/04/2014   NA 139 03/04/2014   K 3.6 03/04/2014   CL 105 03/04/2014   CREATININE 0.8 03/04/2014   BUN 9 03/04/2014   CO2 24 03/04/2014   TSH 2.34  05/28/2013   HGBA1C 8.6* 03/04/2014   MICROALBUR 0.4 07/26/2010   CXR ?R PNA     Assessment & Plan:

## 2014-05-21 NOTE — Assessment & Plan Note (Addendum)
Finished abx Better Cough syr not to use w/Norco discussed

## 2014-05-21 NOTE — Progress Notes (Signed)
Pre visit review using our clinic review tool, if applicable. No additional management support is needed unless otherwise documented below in the visit note. 

## 2014-05-21 NOTE — Assessment & Plan Note (Signed)
8/15 ?yeast Diflucan if not better

## 2014-05-21 NOTE — Assessment & Plan Note (Signed)
Better  Continue with current prescription therapy as reflected on the Med list. Use SS

## 2014-06-10 ENCOUNTER — Encounter: Payer: Self-pay | Admitting: Internal Medicine

## 2014-06-10 ENCOUNTER — Ambulatory Visit: Payer: Medicare Other | Admitting: Internal Medicine

## 2014-06-10 ENCOUNTER — Ambulatory Visit (INDEPENDENT_AMBULATORY_CARE_PROVIDER_SITE_OTHER): Payer: Medicare Other | Admitting: Internal Medicine

## 2014-06-10 ENCOUNTER — Ambulatory Visit (INDEPENDENT_AMBULATORY_CARE_PROVIDER_SITE_OTHER)
Admission: RE | Admit: 2014-06-10 | Discharge: 2014-06-10 | Disposition: A | Discharge status: Home / Self Care | Payer: Medicare Other | Source: Ambulatory Visit | Attending: Internal Medicine | Admitting: Internal Medicine

## 2014-06-10 VITALS — BP 192/118 | HR 103 | Temp 98.6°F | Ht 65.0 in | Wt 245.8 lb

## 2014-06-10 DIAGNOSIS — Z634 Disappearance and death of family member: Secondary | ICD-10-CM

## 2014-06-10 DIAGNOSIS — J189 Pneumonia, unspecified organism: Secondary | ICD-10-CM

## 2014-06-10 DIAGNOSIS — F4321 Adjustment disorder with depressed mood: Secondary | ICD-10-CM

## 2014-06-10 DIAGNOSIS — J45909 Unspecified asthma, uncomplicated: Secondary | ICD-10-CM

## 2014-06-10 MED ORDER — UMECLIDINIUM-VILANTEROL 62.5-25 MCG/INH IN AEPB: 1.0000 | INHALATION_SPRAY | Freq: Every day | RESPIRATORY_TRACT | Status: DC

## 2014-06-10 MED ORDER — METHYLPREDNISOLONE ACETATE 80 MG/ML IJ SUSP
80.0000 mg | Freq: Once | INTRAMUSCULAR | Status: AC
  Administered 2014-06-10: 80 mg via INTRAMUSCULAR

## 2014-06-10 NOTE — Progress Notes (Signed)
Subjective:    Massie's daughter was found dead 2 wks ago - ?overdose. Primrose is very upset...  Cough This is a recurrent problem. The current episode started more than 1 month ago (4 weeks). The problem has been waxing and waning. The problem occurs hourly. The cough is productive of sputum. Associated symptoms include shortness of breath and wheezing. Pertinent negatives include no chest pain, chills, ear congestion, fever, headaches, heartburn or rash. She has tried OTC cough suppressant (Levaquin x 7 d, then Doxy) for the symptoms. The treatment provided no relief. Her past medical history is significant for asthma.  We used 2 abx, one Depomedrol inj CBGs are better - 120 fasting    She has chronic depression, anxiety, headaches and chronic moderate fibromyalgia/LBP symptoms controlled with medicines. F/u chronic GI complaints.  She is on Advair now     Wt Readings from Last 3 Encounters:  06/10/14 245 lb 12.8 oz (111.494 kg)  05/21/14 248 lb (112.492 kg)  05/01/14 249 lb 8 oz (113.172 kg)   BP Readings from Last 3 Encounters:  06/10/14 192/118  05/21/14 160/90  05/08/14 154/98     Review of Systems  Constitutional: Negative for fever, chills, activity change, appetite change and unexpected weight change.  HENT: Negative for mouth sores.   Eyes: Negative for visual disturbance.  Respiratory: Positive for cough, shortness of breath and wheezing. Negative for chest tightness.   Cardiovascular: Negative for chest pain.  Gastrointestinal: Negative for heartburn.  Genitourinary: Negative for frequency, difficulty urinating and vaginal pain.  Musculoskeletal: Positive for arthralgias. Negative for gait problem.  Skin: Negative for pallor and rash.  Neurological: Negative for tremors and headaches.  Psychiatric/Behavioral: Positive for sleep disturbance. Negative for suicidal ideas, confusion and decreased concentration. The patient is nervous/anxious.        Objective:    Physical Exam  Constitutional: She appears well-developed. No distress.  Obese NAD  HENT:  Head: Normocephalic.  Right Ear: External ear normal.  Left Ear: External ear normal.  Nose: Nose normal.  Mouth/Throat: Oropharynx is clear and moist.  Eyes: Conjunctivae are normal. Pupils are equal, round, and reactive to light. Right eye exhibits no discharge. Left eye exhibits no discharge.  Neck: Normal range of motion. Neck supple. No JVD present. No tracheal deviation present. No thyromegaly present.  Cardiovascular: Normal rate, regular rhythm and normal heart sounds.   Pulmonary/Chest: No stridor. No respiratory distress. She has wheezes. She has rales.  Abdominal: Soft. Bowel sounds are normal. She exhibits no distension and no mass. There is no tenderness. There is no rebound and no guarding.  Musculoskeletal: She exhibits no edema and no tenderness.  Lymphadenopathy:    She has no cervical adenopathy.  Neurological: She displays normal reflexes. No cranial nerve deficit. She exhibits normal muscle tone. Coordination normal.  Skin: No rash noted. No erythema.  Psychiatric: Judgment and thought content normal.  Tearful, depressed    Lab Results  Component Value Date   WBC 8.8 11/27/2013   HGB 14.0 11/27/2013   HCT 41.8 11/27/2013   PLT 283.0 11/27/2013   GLUCOSE 209* 03/04/2014   CHOL 169 11/27/2013   TRIG 213.0* 11/27/2013   HDL 42.50 11/27/2013   LDLDIRECT 80.3 07/26/2010   LDLCALC 84 11/27/2013   ALT 19 03/04/2014   AST 21 03/04/2014   NA 139 03/04/2014   K 3.6 03/04/2014   CL 105 03/04/2014   CREATININE 0.8 03/04/2014   BUN 9 03/04/2014   CO2 24 03/04/2014  TSH 2.34 05/28/2013   HGBA1C 8.6* 03/04/2014   MICROALBUR 0.4 07/26/2010   I personally provided [place] inhaler use teaching. After the teaching patient was able to demonstrate it's use effectively. All questions were answered        Assessment & Plan:

## 2014-06-10 NOTE — Assessment & Plan Note (Signed)
Bowie's daughter was found dead 2 wks ago - ?overdose. [name] is very upset.Marland KitchenMarland Kitchen

## 2014-06-10 NOTE — Assessment & Plan Note (Signed)
Cx - not better

## 2014-06-10 NOTE — Progress Notes (Signed)
Pre visit review using our clinic review tool, if applicable. No additional management support is needed unless otherwise documented below in the visit note. 

## 2014-06-10 NOTE — Assessment & Plan Note (Addendum)
Not better CXR Cont meds

## 2014-06-10 NOTE — Assessment & Plan Note (Signed)
[name]'s daughter was found dead 2 wks ago - ?overdose. [name] is very upset.Marland KitchenMarland Kitchen

## 2014-06-15 ENCOUNTER — Other Ambulatory Visit: Payer: Medicare Other

## 2014-06-15 ENCOUNTER — Ambulatory Visit (INDEPENDENT_AMBULATORY_CARE_PROVIDER_SITE_OTHER): Payer: 59 | Admitting: Psychology

## 2014-06-15 DIAGNOSIS — J189 Pneumonia, unspecified organism: Secondary | ICD-10-CM

## 2014-06-15 DIAGNOSIS — F331 Major depressive disorder, recurrent, moderate: Secondary | ICD-10-CM

## 2014-06-18 LAB — RESPIRATORY CULTURE OR RESPIRATORY AND SPUTUM CULTURE

## 2014-06-19 ENCOUNTER — Encounter: Payer: Self-pay | Admitting: Internal Medicine

## 2014-06-19 ENCOUNTER — Telehealth: Payer: Self-pay | Admitting: Internal Medicine

## 2014-06-19 NOTE — Telephone Encounter (Signed)
RA pt has been referred to see CY for allergies/asthma by Dr. Alain Marion.  She last was seen by you in 2013.  Ok to schedule pt to see CY?  Please advise. thanks

## 2014-06-19 NOTE — Telephone Encounter (Signed)
Dr. Alain Marion referred pt for allergies, also.  This is scheduled as an allergy consult.  Can pt be worked in sooner w/ CY.  Pt states she can not wait to be seen until November.  Satira Anis

## 2014-06-19 NOTE — Telephone Encounter (Signed)
Per referral: Dr Annamaria Boots Dx: refractory asthma, allergies Thx  Looks like pt needs to be seen for both. Is it okay for pt to switch to CDY? thanks

## 2014-06-19 NOTE — Telephone Encounter (Signed)
Pl clarify - if referral was for allergies , then certainly OK If for uncontrolled asthma, recent pna - then I can see Will fwd to dr Alain Marion

## 2014-06-23 ENCOUNTER — Telehealth: Payer: Self-pay | Admitting: Internal Medicine

## 2014-06-23 NOTE — Telephone Encounter (Signed)
Patient is very upset and crying.  Her ribs are in a lot of pain from coughing.  She states Dr. Camila Li referred her to pulmonary.  Pulmonary will not get her in until November.  She states she is still having issues breathing.  She states she received labs back from [facility] stating she had strep A.  She is still coughing up a lot of mucus.  I have scheduled her for tomorrow morning with hopper at 10:15am.  She would like to know if you could contact pulmonary to see if they might be able to work her in sooner.

## 2014-06-23 NOTE — Telephone Encounter (Signed)
I called Pulmonary and spoke to [facility]. She states there are no sooner openings with Dr. Annamaria Boots. Pt informed to call Pulmonary daily and see if he has cancellations.

## 2014-06-24 ENCOUNTER — Ambulatory Visit (INDEPENDENT_AMBULATORY_CARE_PROVIDER_SITE_OTHER): Payer: 59 | Admitting: Psychology

## 2014-06-24 ENCOUNTER — Encounter: Payer: Self-pay | Admitting: Internal Medicine

## 2014-06-24 ENCOUNTER — Other Ambulatory Visit (INDEPENDENT_AMBULATORY_CARE_PROVIDER_SITE_OTHER): Payer: Medicare Other

## 2014-06-24 ENCOUNTER — Ambulatory Visit (INDEPENDENT_AMBULATORY_CARE_PROVIDER_SITE_OTHER): Payer: Medicare Other | Admitting: Internal Medicine

## 2014-06-24 VITALS — BP 124/82 | HR 93 | Temp 98.1°F | Resp 14 | Wt 245.4 lb

## 2014-06-24 DIAGNOSIS — E1165 Type 2 diabetes mellitus with hyperglycemia: Secondary | ICD-10-CM

## 2014-06-24 DIAGNOSIS — Z8619 Personal history of other infectious and parasitic diseases: Secondary | ICD-10-CM

## 2014-06-24 DIAGNOSIS — IMO0001 Reserved for inherently not codable concepts without codable children: Secondary | ICD-10-CM

## 2014-06-24 DIAGNOSIS — J441 Chronic obstructive pulmonary disease with (acute) exacerbation: Secondary | ICD-10-CM

## 2014-06-24 DIAGNOSIS — F331 Major depressive disorder, recurrent, moderate: Secondary | ICD-10-CM

## 2014-06-24 DIAGNOSIS — Z8719 Personal history of other diseases of the digestive system: Secondary | ICD-10-CM

## 2014-06-24 LAB — CBC WITH DIFFERENTIAL/PLATELET
BASOS PCT: 1.1 % (ref 0.0–3.0)
Basophils Absolute: 0.2 10*3/uL — ABNORMAL HIGH (ref 0.0–0.1)
Eosinophils Absolute: 0.4 10*3/uL (ref 0.0–0.7)
Eosinophils Relative: 2.6 % (ref 0.0–5.0)
HCT: 46.2 % — ABNORMAL HIGH (ref 36.0–46.0)
HEMOGLOBIN: 15.4 g/dL — AB (ref 12.0–15.0)
LYMPHS ABS: 5 10*3/uL — AB (ref 0.7–4.0)
Lymphocytes Relative: 34.2 % (ref 12.0–46.0)
MCHC: 33.5 g/dL (ref 30.0–36.0)
MCV: 91.9 fl (ref 78.0–100.0)
MONOS PCT: 5.1 % (ref 3.0–12.0)
Monocytes Absolute: 0.7 10*3/uL (ref 0.1–1.0)
NEUTROS ABS: 8.3 10*3/uL — AB (ref 1.4–7.7)
Neutrophils Relative %: 57 % (ref 43.0–77.0)
Platelets: 329 10*3/uL (ref 150.0–400.0)
RBC: 5.02 Mil/uL (ref 3.87–5.11)
RDW: 12.6 % (ref 11.5–15.5)
WBC: 14.5 10*3/uL — ABNORMAL HIGH (ref 4.0–10.5)

## 2014-06-24 LAB — HEMOGLOBIN A1C: Hgb A1c MFr Bld: 8.5 % — ABNORMAL HIGH (ref 4.6–6.5)

## 2014-06-24 MED ORDER — BUDESONIDE-FORMOTEROL FUMARATE 160-4.5 MCG/ACT IN AERO: 2.0000 | INHALATION_SPRAY | Freq: Two times a day (BID) | RESPIRATORY_TRACT | Status: AC

## 2014-06-24 NOTE — Progress Notes (Signed)
   Subjective:    Patient ID: [name], female    DOB: 1955/11/25, 58 y.o.   MRN: [id number]  HPI  Patient is seen today for follow-up of cough.  She was first evaluated 05/01/14 at which time CXR demonstrated ?infiltrate and she was treated for pneumonia with Levaquin.  Follow-up 05/21/14 her symptoms were not improved and she was given a steroid injection and treated with Doxycycline.    Repeat CXR 06/10/14 normal.  Sputum culture 06/15/14 positive for group A strep.  She has been referred to pulmonary but they are not able to see until end of October.  She states that she has persistent productive cough of yellow/tan sputum, wheezing,   Review of Systems     Objective:   Physical Exam        Assessment & Plan:

## 2014-06-24 NOTE — Assessment & Plan Note (Addendum)
Risk discussed of recurrent antibiotics

## 2014-06-24 NOTE — Patient Instructions (Addendum)
Resume Probiotic & Singulair  Plain Mucinex (NOT D) for thick secretions ;force NON dairy fluids .   Nasal cleansing in the shower as discussed with lather of mild shampoo.After 10 seconds wash off lather while  exhaling through nostrils. Make sure that all residual soap is removed to prevent irritation.  Flonase OR Nasacort AQ 1 spray in each nostril twice a day as needed. Use the "crossover" technique into opposite nostril spraying toward opposite ear @ 45 degree angle, not straight up into nostril.  Use a Neti pot daily only  as needed for significant sinus congestion; going from open side to congested side . Plain Allegra (NOT D )  160 daily , Loratidine 10 mg , OR Zyrtec 10 mg @ bedtime  as needed for itchy eyes & sneezing.

## 2014-06-24 NOTE — Assessment & Plan Note (Signed)
A1c

## 2014-06-24 NOTE — Progress Notes (Signed)
Pre visit review using our clinic review tool, if applicable. No additional management support is needed unless otherwise documented below in the visit note. 

## 2014-06-24 NOTE — Progress Notes (Signed)
   Subjective:    Patient ID: [name], female    DOB: Dec 22, 1955, 58 y.o.   MRN: [id number]  HPI  She is here for followup of cough. She was first seen 05/01/14; chest x-ray demonstrated a questionable infiltrate  & she was treated for possible community acquired pneumonia with Levaquin. As of 05/21/14 her symptoms were not improved and she was given a steroid injection and treated with doxycycline.  Repeat chest x-ray 06/10/14 was normal.  Sputum culture 06/15/14 was positive for group A strep. She has been referred to pulmonary ;appt is pending.  She continues to have persistent cough productive of yellow/tan sputum associated with some wheezing.Ruthe Mannan was switched to Advair. She's been using DuoNeb and albuterol as well ,up to 3-4 times per day each. She has stopped her Singulair  She has a history of  IBS. She's also had C. difficile colitis on 2 occasions in the context of antibiotic therapy.  Past history also includes Nissen fundoplication.  Her daughter was found dead approximately one month ago; cause of death has not been defined.   Review of Systems    She is not having significant extrinsic symptoms of itchy, watery eyes. She is not having fever, chills, or sweats  There is no nasal purulence.  Reflux is not problematic.  Her fasting blood sugars range 100-120. Her last A1c was 8. 6% on 03/04/14.       Objective:   Physical Exam  Pertinent positive physical findings include: BMIs compatible with obesity. She has minimal erythema the oropharynx She has diffuse low-grade wheezing in all lung fields without increased work of breathing.  General appearance :adequately nourished; in no distress. Eyes: No conjunctival inflammation or scleral icterus is present. Oral exam: Dental hygiene is good. Lips and gums are healthy appearing.There is no oropharyngeal erythema or exudate noted.  Heart:  Normal rate on recheck ; regular rhythm. S1 and S2 normal without  gallop, murmur, click, rub or other extra sounds   Abdomen: Massive; bowel sounds normal, soft and non-tender without masses, organomegaly or hernias noted.  No guarding or rebound. No flank tenderness to percussion. Vascular : all pulses equal ; no bruits present. Skin:Warm & dry.  Intact without suspicious lesions or rashes ; no jaundice or tenting Lymphatic: No lymphadenopathy is noted about the head, neck, axilla           Assessment & Plan:  See Current Assessment & Plan in Problem List under specific Diagnosis

## 2014-06-24 NOTE — Assessment & Plan Note (Addendum)
Symbicort in place of Advair as pulmonary status not stable

## 2014-06-26 NOTE — Telephone Encounter (Signed)
Katie, where can we schedule this pt? [place] Bing, CMA

## 2014-06-26 NOTE — Telephone Encounter (Signed)
Per CY-lets have patient come in to see him on Wednesday 07-08-14 at 11:15am slot for 30 minute OV. Thanks.

## 2014-06-26 NOTE — Telephone Encounter (Signed)
[email or web address] x1 

## 2014-06-26 NOTE — Telephone Encounter (Signed)
Please advise Dr. Young thanks 

## 2014-06-26 NOTE — Telephone Encounter (Signed)
[place] with me. You would need to talk with [facility] about scheduling.

## 2014-06-26 NOTE — Telephone Encounter (Signed)
OK 

## 2014-06-28 ENCOUNTER — Other Ambulatory Visit: Payer: Self-pay | Admitting: Internal Medicine

## 2014-06-29 NOTE — Telephone Encounter (Signed)
Pt returned call.  Scheduled w/ CY on 10/14 @ 11:15 for 30 minute OV.  Pt verbalized understanding & states nothing further needed at this time.  ///hdp

## 2014-06-29 NOTE — Telephone Encounter (Signed)
lmtcb

## 2014-07-04 ENCOUNTER — Other Ambulatory Visit: Payer: Self-pay

## 2014-07-04 MED ORDER — PROMETHAZINE-CODEINE 6.25-10 MG/5ML PO SYRP: ORAL_SOLUTION | ORAL | Status: AC

## 2014-07-06 ENCOUNTER — Encounter: Payer: Self-pay | Admitting: Internal Medicine

## 2014-07-06 ENCOUNTER — Ambulatory Visit (INDEPENDENT_AMBULATORY_CARE_PROVIDER_SITE_OTHER): Payer: 59 | Admitting: Psychology

## 2014-07-06 ENCOUNTER — Telehealth: Payer: Self-pay

## 2014-07-06 DIAGNOSIS — F332 Major depressive disorder, recurrent severe without psychotic features: Secondary | ICD-10-CM

## 2014-07-06 NOTE — Telephone Encounter (Signed)
Faxed phenergan with codeine 6.25-10 mg/11ml syrup to pharmacy

## 2014-07-07 ENCOUNTER — Encounter: Payer: Medicare Other | Attending: Internal Medicine | Admitting: *Deleted

## 2014-07-07 ENCOUNTER — Encounter: Payer: Self-pay | Admitting: *Deleted

## 2014-07-07 VITALS — Ht 65.0 in | Wt 238.6 lb

## 2014-07-07 DIAGNOSIS — Z794 Long term (current) use of insulin: Secondary | ICD-10-CM | POA: Diagnosis not present

## 2014-07-07 DIAGNOSIS — E119 Type 2 diabetes mellitus without complications: Secondary | ICD-10-CM

## 2014-07-07 DIAGNOSIS — Z713 Dietary counseling and surveillance: Secondary | ICD-10-CM | POA: Insufficient documentation

## 2014-07-07 NOTE — Patient Instructions (Signed)
Plan:  Aim for 2-3 Carb Choices per meal (30-45 grams) +/- 1 either way  Aim for 0-15 Carbs per snack if hungry  Include protein in moderation with your meals and snacks Consider reading food labels for Total Carbohydrate and Fat Grams of foods Consider  increasing your activity level by walking for 30 minutes daily as tolerated Continue checking BG at alternate times per day as directed by MD  Continue taking medication  as directed by MD  [place] / [employer] reduced calorie bread is also reduced carb

## 2014-07-07 NOTE — Progress Notes (Signed)
Diabetes Self-Management Education  Visit Type:  DSME  Appt. Start Time: 0900 Appt. End Time: 1030  07/07/2014  [name], identified by name and date of birth, is a 58 y.o. female with a diagnosis of Diabetes: Type 2.  Other people present during visit:  Spouse/SO   ASSESSMENT  Height 5\' 5"  (1.651 m), weight 238 lb 9.6 oz (108.228 kg). Body mass index is 39.71 kg/(m^2).  Initial Visit Information:  Are you currently following a meal plan?: No   Are you taking your medications as prescribed?: Yes Are you checking your feet?: Yes (diabetic neuropathy) How many days per week are you checking your feet?: 7 How often do you need to have someone help you when you read instructions, pamphlets, or other written materials from your doctor or pharmacy?: 1 - Never    Psychosocial:     Patient Belief/Attitude about Diabetes: Other (comment) (not very motivated due to depression/loss) Self-care barriers: Other (comment) (loss of child within the past month) Self-management support: Doctor's office;CDE visits;Family Other persons present: Spouse/SO Patient Concerns: Nutrition/Meal planning Special Needs: Instruct caregiver;Other (comment) ("don't have a very good memory") Preferred Learning Style: No preference indicated Learning Readiness: Not Ready  Complications:   Last HgB A1C per patient/outside source: 8.5 mg/dL How often do you check your blood sugar?: 1-2 times/day Fasting Blood glucose range (mg/dL): 70-129 Postprandial Blood glucose range (mg/dL): >200 (had a 300 reading r/t a piece of cake) Number of hypoglycemic episodes per month: 3 (symptomatic @ 90mg /dl) Can you tell when your blood sugar is low?: Yes What do you do if your blood sugar is low?: fruit, 1/2 peanut butter sandwich, pepperminds Have you had a dilated eye exam in the past 12 months?: Yes Have you had a dental exam in the past 12 months?: Yes  Diet Intake:  Breakfast: bacon X4, white toast X2  dry, pancakes on w/e, rice & fried eggs Snack (morning): yogurt (Activia low fat) Lunch: campbells soup, crackers Snack (afternoon): chips Dinner: pork chops, rice    chicken, mashed potatoes, green beans corn        brocolli Snack (evening): regular jello with fruit Beverage(s): skim milk, coffee sugar X 1 1/2 tblsp , creamer, regular pepsi (allergic to artificial sweeteners) 1/2 - 2cans, water  Exercise:  Exercise: ADL's  Individualized Plan for Diabetes Self-Management Training:   Learning Objective:  Patient will have a greater understanding of diabetes self-management.  Patient education plan per assessed needs and concerns is to attend individual sessions for     Education Topics Reviewed with Patient Today:    Role of diet in the treatment of diabetes and the relationship between the three main macronutrients and blood glucose level;Food label reading, portion sizes and measuring food.;Carbohydrate counting;Reviewed blood glucose goals for pre and post meals and how to evaluate the patients' food intake on their blood glucose level.;Information on hints to eating out and maintain blood glucose control.;Meal options for control of blood glucose level and chronic complications. Role of exercise on diabetes management, blood pressure control and cardiac health.;Helped patient identify appropriate exercises in relation to his/her diabetes, diabetes complications and other health issue.   Daily foot exams;Yearly dilated eye exam;Purpose and frequency of SMBG.;Identified appropriate SMBG and/or A1C goals. Taught treatment of hypoglycemia - the 15 rule. Relationship between chronic complications and blood glucose control;Assessed and discussed foot care and prevention of foot problems;Identified and discussed with patient  current chronic complications;Reviewed with patient heart disease, higher risk of, and prevention;Dental care  Role of stress on diabetes;Brainstormed with patient on  coping mechanisms for social situations, getting support from significant others, dealing with feelings about diabetes   Lifestyle issues that need to be addressed for better diabetes care  PATIENTS GOALS/Plan (Developed by the patient):  Nutrition: Follow meal plan discussed;General guidelines for healthy choices and portions discussed Physical Activity: Exercise 3-5 times per week;15 minutes per day Medications: take my medication as prescribed Reducing Risk: do foot checks daily Health Coping: Other (comment) (Continue to see therapist)  Plan:   Patient Instructions  Plan:  Aim for 2-3 Carb Choices per meal (30-45 grams) +/- 1 either way  Aim for 0-15 Carbs per snack if hungry  Include protein in moderation with your meals and snacks Consider reading food labels for Total Carbohydrate and Fat Grams of foods Consider  increasing your activity level by walking for 30 minutes daily as tolerated Continue checking BG at alternate times per day as directed by MD  Continue taking medication  as directed by MD  [place] / [employer] reduced calorie bread is also reduced carb       Expected Outcomes:  Demonstrated interest in learning. Expect positive outcomes (anticipate positive outcomes being delayed to to present grief over loss of daughter. She and her husband are presently raising their 15yo grandaughter as a result of their daughter's death.)  Education material provided: Living Well with Diabetes, A1C conversion sheet, Meal plan card, My Plate, Snack sheet, Support group flyer and Carbohydrate counting sheet  If problems or questions, patient to contact team via:  Phone  Future DSME appointment: PRN

## 2014-07-08 ENCOUNTER — Encounter: Payer: Self-pay | Admitting: Internal Medicine

## 2014-07-08 ENCOUNTER — Ambulatory Visit (INDEPENDENT_AMBULATORY_CARE_PROVIDER_SITE_OTHER): Payer: Medicare Other | Admitting: Internal Medicine

## 2014-07-08 VITALS — BP 136/72 | HR 95 | Ht 65.0 in | Wt 249.0 lb

## 2014-07-08 DIAGNOSIS — J441 Chronic obstructive pulmonary disease with (acute) exacerbation: Secondary | ICD-10-CM

## 2014-07-08 DIAGNOSIS — J3089 Other allergic rhinitis: Secondary | ICD-10-CM

## 2014-07-08 DIAGNOSIS — J302 Other seasonal allergic rhinitis: Secondary | ICD-10-CM

## 2014-07-08 DIAGNOSIS — J309 Allergic rhinitis, unspecified: Secondary | ICD-10-CM

## 2014-07-08 DIAGNOSIS — J45901 Unspecified asthma with (acute) exacerbation: Secondary | ICD-10-CM

## 2014-07-08 DIAGNOSIS — Z23 Encounter for immunization: Secondary | ICD-10-CM

## 2014-07-08 MED ORDER — PREDNISONE 10 MG PO TABS: ORAL_TABLET | ORAL | Status: DC

## 2014-07-08 NOTE — Assessment & Plan Note (Signed)
mild persistent chronic bronchitis, is characterized as an asthmatic bronchitis Plan- Sample Incruse, prednisone taper on request after discussion, discussed vaccination status-giving flu vaccine and Pneumovax booster

## 2014-07-08 NOTE — Progress Notes (Signed)
07/08/14- 19 yoF former smoker (1ppd/ 20 yrs) FOLLOW FOR:  coughing up yellow mucus, wheezing, sob, doe, chest congestion, chest tightness, having severe spasms around the ribcage both sides Seen by Dr Alva/ Pulmonary 2013 for asthma/ bronchitis worsened by GERD, allergy Daughter recently died OD. Worse since then outpatient pneumonia in the spring of 2015 with increased cough, thick white/yellow nonbloody sputum. Never feels clear. Using rescue inhaler several times a day, nebulizer 2 or 3 times a day, Symbicort, Singulair, Flonase. Triggers: Exertion, strong odors, pollens, viral infections Allergy skin test several years ago. Tried allergy vaccine for 2 years but could not tell that helped . No reflux recognized since fundoplication but does take occasional TUMS tablet and continues omeprazole. Spring and fall pollen related nasal congestion. History of rash to detergents and to contrast dye. No problem with foods. Medical problems reviewed . Fibromyalgia. Migraine headaches relieved by Botox injection. History of sinus surgery, turbinate reduction and septoplasty. CXR 06/10/14 IMPRESSION:  No acute cardiopulmonary process.  Electronically Signed  By: Lovey Newcomer M.D.  On: 06/10/2014 18:12   Prior to Admission medications   Medication Sig Start Date End Date Taking? Authorizing Provider  albuterol (VENTOLIN HFA) 108 (90 BASE) MCG/ACT inhaler Inhale 2 puffs into the lungs every 4 (four) hours as needed for wheezing or shortness of breath.   Yes Aleksei Plotnikov V, MD  Botulinum Toxin Type A (BOTOX) 200 UNITS SOLR Every 3 months for headaches, given by Neurologist, Dr. Domingo Cocking    Yes Historical Provider, MD  budesonide-formoterol [facility]) 160-4.5 MCG/ACT inhaler Inhale 2 puffs into the lungs 2 (two) times daily. 06/24/14  Yes Hendricks Limes, MD  clonazePAM [facility] [place]) 1 MG tablet 1-2 tablets daily prn 04/20/14  Yes Rowe Clack, MD  cyclobenzaprine (FLEXERIL) 10 MG tablet TAKE 1  TABLET AT BEDTIME FOR NIGHT TIME MUSCLE SPASM 05/08/14  Yes Aleksei Plotnikov V, MD  cycloSPORINE (RESTASIS) 0.05 % ophthalmic emulsion Place 1 drop into both eyes daily.     Yes Historical Provider, MD  dicyclomine (BENTYL) 20 MG tablet TAKE 1 TABLET BY MOUTH 3 TIMES DAILY AS NEEDED 01/30/14  Yes Aleksei Plotnikov V, MD  escitalopram (LEXAPRO) 20 MG tablet Take 20 mg by mouth daily. 11/14/13  Yes Aleksei Plotnikov V, MD  estradiol (ESTRACE) 0.5 MG tablet Take 1 tablet by mouth  daily   Yes Aleksei Plotnikov V, MD  fluconazole (DIFLUCAN) 100 MG tablet Take 2 tabs on day#1, then 1 tab daily on Days #2-10 05/21/14  Yes Aleksei Plotnikov V, MD  furosemide (LASIX) 20 MG tablet Take 1 tablet (20 mg total) by mouth daily as needed (swelling). As needed 11/26/12  Yes Aleksei Plotnikov V, MD  HYDROcodone-acetaminophen (NORCO) 10-325 MG per tablet Take 1 tablet by mouth 4 (four) times daily as needed. 05/21/14  Yes Aleksei Plotnikov V, MD  hydrOXYzine (VISTARIL) 50 MG capsule 2 TAB BY MOUTH FOR INSOMNIA QHS PRN 12/23/13  Yes Aleksei Plotnikov V, MD  Insulin Detemir (LEVEMIR) 100 UNIT/ML Pen Use 30 units in am; 30 unit in pm 05/08/14  Yes Aleksei Plotnikov V, MD  Insulin Syringe-Needle U-100 (B-D INSULIN SYRINGE 2CC/27.5G) 27.5G X 5/8" 2 ML MISC Use bid 05/08/14  Yes Aleksei Plotnikov V, MD  ipratropium-albuterol (DUONEB) 0.5-2.5 (3) MG/3ML SOLN TAKE 3 MLS BY NEBULIZATION 4 (FOUR) TIMES DAILY AS NEEDED. 08/02/13  Yes Aleksei Plotnikov V, MD  metFORMIN (GLUCOPHAGE) 500 MG tablet Take 1 tablet by mouth  twice a day with meals   Yes Aleksei Plotnikov V,  MD  montelukast (SINGULAIR) 10 MG tablet Take 1 tablet by mouth at  bedtime   Yes Aleksei Plotnikov V, MD  omeprazole (PRILOSEC) 40 MG capsule Take 1 capsule (40 mg total) by mouth daily.   Yes Lew Dawes V, MD  ONE TOUCH ULTRA TEST test strip USE AS DIRECTED 05/14/14  Yes Aleksei Plotnikov V, MD  PATADAY 0.2 % SOLN Use everyday 11/20/11  Yes Historical Provider, MD   promethazine-codeine (PHENERGAN WITH CODEINE) 6.25-10 MG/5ML syrup TAKE 5 MLS BY MOUTH EVERY 4 HOURS AS NEEDED FOR COUGH 07/04/14  Yes Aleksei Plotnikov V, MD  triamcinolone (KENALOG) 0.1 % cream Apply topically 3 (three) times daily. As needed for rash 07/03/11  Yes Aleksei Plotnikov V, MD  predniSONE (DELTASONE) 10 MG tablet 4 X 2 DAYS, 3 X 2 DAYS, 2 X 2 DAYS, 1 X 2 DAYS 07/08/14   Deneise Lever, MD   Past Medical History  Diagnosis Date  . Asthma   . COPD (chronic obstructive pulmonary disease)   . Depression   . GERD (gastroesophageal reflux disease)     Dr Olevia Perches  . Gastroparesis   . Migraines   . Menopause   . IBS (irritable bowel syndrome)   . Diabetes mellitus type II   . Peripheral neuropathy   . Fibromyalgia   . Obesity   . Barrett's esophagus   . MRSA (methicillin resistant Staphylococcus aureus)     possible (by verbal report from pt)  . History of hiatal hernia   . OSA (obstructive sleep apnea)     pt denies 02/13/12, 02/20/12  . C. difficile colitis 01/2012 hosp   Past Surgical History  Procedure Laterality Date  . Abdominal hysterectomy      Complete  . Sinus surgery with instatrak      x3  . Surgery left heel spur    . Appendectomy    . Ventral hernia repair      with mesh  . Cholecystectomy    . Nissen fundoplication  0932  . Mouth surgery    . Esophagogastroduodenoscopy  02/13/2012    Procedure: ESOPHAGOGASTRODUODENOSCOPY (EGD);  Surgeon: Arta Silence, MD;  [facility] ENDOSCOPY;  Service: Endoscopy;  Laterality: N/A;   Family History  Problem Relation Age of Onset  . Diabetes Mother   . Lung cancer Mother   . Cancer Mother     lung  . Esophageal cancer Father   . Heart disease Father   . Cancer Father     esophageal  . Diabetes Father   . Colon polyps Father     Family History  . Breast cancer Maternal Grandmother   . Heart disease Maternal Grandmother   . Diabetes Maternal Grandmother   . Cancer Maternal Grandmother     breast, colon   . Heart attack Brother 51  . Colon cancer Maternal Grandfather   . Colon polyps Maternal Grandfather   . Diabetes Maternal Grandfather   . Colon polyps Paternal Grandfather   . Colon cancer Paternal Grandfather   . Breast cancer Maternal Aunt   . Arthritis    . Hypertension    . Uterine cancer Other     great grandmother   History   Social History  . Marital Status: Married    Spouse Name: N/A    Number of Children: 3  . Years of Education: N/A   Occupational History  . CMA disabled    Social History Main Topics  . Smoking status: Former Smoker -- 1.00 packs/day  for 20 years    Quit date: 12/24/2009  . Smokeless tobacco: Not on file     Comment: Stopped April 2011  . Alcohol Use: No     Comment: occasional (1-2 times per month)  . Drug Use: No  . Sexual Activity: Yes   Other Topics Concern  . Not on file   Social History Narrative  . No narrative on file   ROS-see HPI Constitutional:   + weight loss, no-night sweats, fevers, chills, fatigue, lassitude. HEENT:   + headaches, difficulty swallowing, tooth/dental problems, sore throat,       +sneezing, no-itching, ear ache, +nasal congestion, post nasal drip,  CV:  No-   chest pain, orthopnea, PND, swelling in lower extremities, anasarca,                                  dizziness, palpitations Resp: +shortness of breath with exertion or at rest.              +productive cough,  No non-productive cough,  No- coughing up of blood.              No-   change in color of mucus.  No- wheezing.   Skin: No-   rash or lesions. GI:  No-   +heartburn,no- indigestion, abdominal pain, nausea, vomiting, diarrhea,                 change in bowel habits, loss of appetite GU: No-   dysuria, change in color of urine, no urgency or frequency.  No- flank pain. MS:  No-   joint pain or swelling.  No- decreased range of motion.  No- back pain. Neuro-     nothing unusual Psych:  No- change in mood or affect. No depression or anxiety.  No  memory loss.  OBJ- Physical Exam General- Alert, Oriented, Affect-appropriate, Distress- none acute, + overweight Skin- rash-none, lesions- none, excoriation- none Lymphadenopathy- none Head- atraumatic            Eyes- Gross vision intact, PERRLA, conjunctivae and secretions clear            Ears- Hearing, canals-normal            Nose- Clear, no-Septal dev, mucus, polyps, erosion, perforation             Throat- Mallampati II , mucosa clear , drainage- none, tonsils- atrophic Neck- flexible , trachea midline, no stridor , thyroid nl, carotid no bruit Chest - symmetrical excursion , unlabored           Heart/CV- RRR , no murmur , no gallop  , no rub, nl s1 s2                           - JVD- none , edema- none, stasis changes- none, varices- none           Lung- , wheeze+ mild, cough+ raspy , dullness-none, rub- none           Chest wall-  Abd- tender-no, distended-no, bowel sounds-present, HSM- no Br/ Gen/ Rectal- Not done, not indicated Extrem- cyanosis- none, clubbing, none, atrophy- none, strength- nl Neuro- grossly intact to observation

## 2014-07-08 NOTE — Assessment & Plan Note (Addendum)
Mild seasonal worsening, nonspecific. I don't think she has a significant bacterial infection now but sputum cultured Strp pyogenes Plan- Prednisone taper and sample Incruse for therapeutic trial, flu shot

## 2014-07-08 NOTE — Patient Instructions (Addendum)
Flu vax  Pneumovax-23 pneumococcal vaccine  Order- lab- Allergy profile  Dx Seasonal and Perennial allergic rhinitis  Sample Incruse inhaler    1 puff, once daily      Add this to your current meds  Order- schedule PFT    Dx asthma with bronchitis  Sample Dymista nasal spray    1-2 puffs each nostril once daily at bedtime    Try this instead of Flonase/ fluticasone  Prednisone taper sent

## 2014-07-09 ENCOUNTER — Telehealth: Payer: Self-pay | Admitting: *Deleted

## 2014-07-09 NOTE — Telephone Encounter (Signed)
Pt called in regards to changing SSRI, stating she lost her daughter in September and feels like the lexapro is no longer working.  Dr. Cheryln Manly at [facility], who also sees pt agrees a change in meds to cymbalta would be best.  Pt called and advised to make an office visit

## 2014-07-10 ENCOUNTER — Telehealth: Payer: Self-pay | Admitting: *Deleted

## 2014-07-10 MED ORDER — DULOXETINE HCL 30 MG PO CPEP: 30.0000 mg | ORAL_CAPSULE | Freq: Every day | ORAL | Status: DC

## 2014-07-10 NOTE — Telephone Encounter (Signed)
Spoke with pt and she would like to go ahead and restart cymbalta. Pt had C Diff infection and does not believe the cymbalta gave her diarrhea

## 2014-07-10 NOTE — Telephone Encounter (Signed)
[name] had diarrhea on Cymbalta in the past. Would she like to try again? Thx

## 2014-07-10 NOTE — Telephone Encounter (Signed)
Duplicate msg . See previous msg waiting on md response...[name]

## 2014-07-10 NOTE — Addendum Note (Signed)
Addended by: Cassandria Anger on: 07/10/2014 02:27 PM   Modules accepted: Orders

## 2014-07-10 NOTE — Telephone Encounter (Signed)
Ok Start w/30 mg/d Thx

## 2014-07-10 NOTE — Telephone Encounter (Signed)
Notified pt with md response. Med has [facility].../lmb

## 2014-07-13 ENCOUNTER — Other Ambulatory Visit: Payer: Self-pay

## 2014-07-13 MED ORDER — CLONAZEPAM 1 MG PO TABS: ORAL_TABLET | ORAL | Status: DC

## 2014-07-16 ENCOUNTER — Telehealth: Payer: Self-pay | Admitting: *Deleted

## 2014-07-16 NOTE — Telephone Encounter (Signed)
Called pt no answer LMOM (cell) with md response...[name]

## 2014-07-16 NOTE — Telephone Encounter (Signed)
Pt stated md ok her to start back taking the cymbalta. She is currently taking the lexapro, but he did not give instructions on how she should wean herself off the lexapro. Inform pt md is out of office will have to send to another md with their response. Pls advise...Johny Chess

## 2014-07-16 NOTE — Telephone Encounter (Signed)
She can stop taking lexapro today (tomorrow if she has already taken today) and start taking cymbalta tomorrow (or following day if she stops lexapro tomorrow). There is no need to decrease doses of lexapro.

## 2014-07-20 ENCOUNTER — Ambulatory Visit (INDEPENDENT_AMBULATORY_CARE_PROVIDER_SITE_OTHER): Payer: 59 | Admitting: Psychology

## 2014-07-20 DIAGNOSIS — F332 Major depressive disorder, recurrent severe without psychotic features: Secondary | ICD-10-CM

## 2014-07-27 ENCOUNTER — Other Ambulatory Visit: Payer: Self-pay | Admitting: Geriatric Medicine

## 2014-07-27 MED ORDER — ALBUTEROL SULFATE HFA 108 (90 BASE) MCG/ACT IN AERS: 2.0000 | INHALATION_SPRAY | RESPIRATORY_TRACT | Status: DC | PRN

## 2014-07-27 MED ORDER — DULOXETINE HCL 30 MG PO CPEP: 30.0000 mg | ORAL_CAPSULE | Freq: Every day | ORAL | Status: AC

## 2014-07-27 MED ORDER — DULOXETINE HCL 30 MG PO CPEP: 30.0000 mg | ORAL_CAPSULE | Freq: Every day | ORAL | Status: DC

## 2014-07-27 NOTE — Addendum Note (Signed)
Addended by: Earnstine Regal on: 07/27/2014 10:30 AM   Modules accepted: Orders

## 2014-07-27 NOTE — Telephone Encounter (Signed)
Resent cymbalta to [facility]...Johny Chess

## 2014-08-03 ENCOUNTER — Ambulatory Visit: Payer: Medicare Other | Admitting: Internal Medicine

## 2014-08-03 ENCOUNTER — Ambulatory Visit: Payer: 59 | Admitting: Psychology

## 2014-08-03 ENCOUNTER — Institutional Professional Consult (permissible substitution): Payer: Medicare Other | Admitting: Internal Medicine

## 2014-08-04 ENCOUNTER — Telehealth: Payer: Self-pay | Admitting: *Deleted

## 2014-08-04 MED ORDER — CLONAZEPAM 1 MG PO TABS: ORAL_TABLET | ORAL | Status: AC

## 2014-08-04 MED ORDER — PROMETHAZINE HCL 25 MG PO TABS: ORAL_TABLET | ORAL | Status: AC

## 2014-08-04 MED ORDER — HYDROCODONE-ACETAMINOPHEN 10-325 MG PO TABS: 1.0000 | ORAL_TABLET | Freq: Four times a day (QID) | ORAL | Status: AC | PRN

## 2014-08-04 NOTE — Telephone Encounter (Signed)
Left msg on triage stating needing her cymbalta, promethazine and clonazepam [facility]. Also need refill on hydrocodone.../lmb   Called pt back inform her the cymbalta was already been sent. Will call back after md ok controls...[name]

## 2014-08-04 NOTE — Telephone Encounter (Signed)
Notified pt sent rx's to [facility], but will have to pick up hydrocodone...[name]

## 2014-08-04 NOTE — Telephone Encounter (Signed)
Ok all Thx

## 2014-08-06 ENCOUNTER — Other Ambulatory Visit: Payer: Medicare Other

## 2014-08-06 DIAGNOSIS — J3089 Other allergic rhinitis: Principal | ICD-10-CM

## 2014-08-06 DIAGNOSIS — J302 Other seasonal allergic rhinitis: Secondary | ICD-10-CM

## 2014-08-07 LAB — ALLERGY FULL PROFILE
Allergen, D pternoyssinus,d7: <0.1 kU/L
Allergen,Goose feathers, e70: <0.1 kU/L
Bermuda Grass: <0.1 kU/L
Box Elder IgE: <0.1 kU/L
Candida Albicans: <0.1 kU/L
Common Ragweed: <0.1 kU/L
Curvularia lunata: <0.1 kU/L
Dog Dander: <0.1 kU/L
Fescue: <0.1 kU/L
G005 Rye, Perennial: <0.1 kU/L
Goldenrod: <0.1 kU/L
Helminthosporium halodes: <0.1 kU/L
House Dust Hollister: <0.1 kU/L
IgE (Immunoglobulin E), Serum: 43 kU/L (ref <115)
Stemphylium Botryosum: <0.1 kU/L

## 2014-08-07 NOTE — Progress Notes (Signed)
Quick Note:  lmtcb ______ 

## 2014-08-13 ENCOUNTER — Ambulatory Visit: Payer: 59 | Admitting: Psychology

## 2014-08-17 ENCOUNTER — Other Ambulatory Visit: Payer: Self-pay | Admitting: Internal Medicine

## 2014-08-17 ENCOUNTER — Other Ambulatory Visit: Payer: Self-pay | Admitting: *Deleted

## 2014-08-17 MED ORDER — INSULIN DETEMIR 100 UNIT/ML FLEXPEN: PEN_INJECTOR | SUBCUTANEOUS | Status: AC

## 2014-08-17 MED ORDER — INSULIN PEN NEEDLE 32G X 4 MM MISC
1.0000 | Freq: Two times a day (BID) | Status: DC
Start: 2014-08-17 — End: 2014-08-28

## 2014-08-17 NOTE — Addendum Note (Signed)
Addended by: Cresenciano Lick on: 08/17/2014 04:55 PM   Modules accepted: Orders

## 2014-08-24 ENCOUNTER — Other Ambulatory Visit: Payer: Self-pay | Admitting: Geriatric Medicine

## 2014-08-24 MED ORDER — ALBUTEROL SULFATE HFA 108 (90 BASE) MCG/ACT IN AERS: 2.0000 | INHALATION_SPRAY | RESPIRATORY_TRACT | Status: AC | PRN

## 2014-08-24 MED ORDER — IPRATROPIUM-ALBUTEROL 0.5-2.5 (3) MG/3ML IN SOLN: RESPIRATORY_TRACT | Status: AC

## 2014-08-28 ENCOUNTER — Other Ambulatory Visit: Payer: Self-pay | Admitting: Internal Medicine

## 2014-09-02 ENCOUNTER — Other Ambulatory Visit: Payer: Self-pay

## 2014-09-02 ENCOUNTER — Ambulatory Visit (INDEPENDENT_AMBULATORY_CARE_PROVIDER_SITE_OTHER): Payer: 59 | Admitting: Psychology

## 2014-09-02 DIAGNOSIS — F332 Major depressive disorder, recurrent severe without psychotic features: Secondary | ICD-10-CM

## 2014-09-02 MED ORDER — INSULIN PEN NEEDLE 31G X 5 MM MISC: 1.0000 "pen " | Freq: Two times a day (BID) | Status: AC

## 2014-09-08 ENCOUNTER — Encounter: Payer: Self-pay | Admitting: Internal Medicine

## 2014-09-08 ENCOUNTER — Ambulatory Visit (INDEPENDENT_AMBULATORY_CARE_PROVIDER_SITE_OTHER): Payer: Medicare Other | Admitting: Internal Medicine

## 2014-09-08 VITALS — BP 144/90 | HR 112 | Ht 64.5 in | Wt 250.0 lb

## 2014-09-08 DIAGNOSIS — J45901 Unspecified asthma with (acute) exacerbation: Secondary | ICD-10-CM

## 2014-09-08 DIAGNOSIS — J4540 Moderate persistent asthma, uncomplicated: Secondary | ICD-10-CM

## 2014-09-08 DIAGNOSIS — K219 Gastro-esophageal reflux disease without esophagitis: Secondary | ICD-10-CM

## 2014-09-08 DIAGNOSIS — G4733 Obstructive sleep apnea (adult) (pediatric): Secondary | ICD-10-CM

## 2014-09-08 LAB — PULMONARY FUNCTION TEST
DL/VA % pred: 100 %
DL/VA: 4.89 ml/min/mmHg/L
DLCO UNC: 19.84 ml/min/mmHg
DLCO unc % pred: 79 %
FEF 25-75 POST: 2.6 L/s
FEF 25-75 Pre: 1.79 L/sec
FEF2575-%Change-Post: 44 %
FEF2575-%Pred-Post: 105 %
FEF2575-%Pred-Pre: 72 %
FEV1-%CHANGE-POST: 5 %
FEV1-%Pred-Post: 74 %
FEV1-%Pred-Pre: 71 %
FEV1-POST: 1.99 L
FEV1-Pre: 1.89 L
FEV1FVC-%CHANGE-POST: 5 %
FEV1FVC-%Pred-Pre: 106 %
FEV6-%Change-Post: 0 %
FEV6-%PRED-PRE: 69 %
FEV6-%Pred-Post: 69 %
FEV6-POST: 2.28 L
FEV6-PRE: 2.28 L
FEV6FVC-%CHANGE-POST: 0 %
FEV6FVC-%PRED-POST: 104 %
FEV6FVC-%PRED-PRE: 103 %
FVC-%Change-Post: 0 %
FVC-%PRED-PRE: 66 %
FVC-%Pred-Post: 66 %
FVC-Post: 2.29 L
FVC-Pre: 2.29 L
POST FEV6/FVC RATIO: 100 %
PRE FEV1/FVC RATIO: 83 %
PRE FEV6/FVC RATIO: 100 %
Post FEV1/FVC ratio: 87 %
RV % PRED: 54 %
RV: 1.07 L
TLC % pred: 77 %
TLC: 3.97 L

## 2014-09-08 MED ORDER — MOMETASONE FURO-FORMOTEROL FUM 200-5 MCG/ACT IN AERO: 2.0000 | INHALATION_SPRAY | Freq: Two times a day (BID) | RESPIRATORY_TRACT | Status: AC

## 2014-09-08 MED ORDER — DOXYCYCLINE HYCLATE 100 MG PO TABS: ORAL_TABLET | ORAL | Status: AC

## 2014-09-08 MED ORDER — FLUCONAZOLE 150 MG PO TABS: 150.0000 mg | ORAL_TABLET | Freq: Every day | ORAL | Status: AC

## 2014-09-08 MED ORDER — TIOTROPIUM BROMIDE MONOHYDRATE 2.5 MCG/ACT IN AERS: 2.0000 | INHALATION_SPRAY | Freq: Every day | RESPIRATORY_TRACT | Status: AC

## 2014-09-08 NOTE — Assessment & Plan Note (Signed)
Mild persistent symptoms. She has some wheeze today despite having 4 puffs of albuterol inhaler with her PFT just an hour before. No response on bronchodilator with PFT. This may be a fixed obstructive pattern but we can try change to a different bronchodilator. Plan-replace Symbicort with sample Dulera 200 for greater steroid dose and add Spiriva samples for trial.

## 2014-09-08 NOTE — Progress Notes (Signed)
PFT done today. 

## 2014-09-08 NOTE — Assessment & Plan Note (Signed)
She says she was told she didn't have enough to treat so she has never used CPAP. We began a discussion and may want to revisit this issue.

## 2014-09-08 NOTE — Assessment & Plan Note (Signed)
She doesn't recognize reflux events but I'm not convinced she isn't having a little when she lies down at night.

## 2014-09-08 NOTE — Patient Instructions (Addendum)
Sample Dulera 200   2 puffs then rinse mouth well, twice daily  (Instead of Symbicort)  Sample Spiriva Respimat 2 puffs  Once daily  Ok to still use the albuterol rescue inhaler every 4-6 hours if needed  Continue the Singulair  Script sent for doxycycline  Script printed for diflucan in case of yeast infection

## 2014-09-08 NOTE — Progress Notes (Signed)
07/08/14- 43 yoF former smoker (1ppd/ 20 yrs) FOLLOW FOR:  coughing up yellow mucus, wheezing, sob, doe, chest congestion, chest tightness, having severe spasms around the ribcage both sides Seen by Dr Alva/ Pulmonary 2013 for asthma/ bronchitis worsened by GERD, allergy Daughter recently died OD. Worse since then outpatient pneumonia in the spring of 2015 with increased cough, thick white/yellow nonbloody sputum. Never feels clear. Using rescue inhaler several times a day, nebulizer 2 or 3 times a day, Symbicort, Singulair, Flonase. Triggers: Exertion, strong odors, pollens, viral infections Allergy skin test several years ago. Tried allergy vaccine for 2 years but could not tell that helped . No reflux recognized since fundoplication but does take occasional TUMS tablet and continues omeprazole. Spring and fall pollen related nasal congestion. History of rash to detergents and to contrast dye. No problem with foods. Medical problems reviewed . Fibromyalgia. Migraine headaches relieved by Botox injection. History of sinus surgery, turbinate reduction and septoplasty. CXR 06/10/14 IMPRESSION:  No acute cardiopulmonary process.  Electronically Signed  By: Lovey Newcomer M.D.  On: 06/10/2014 18:12   09/08/14- 33 yoF former smoker (1ppd/ 20 yrs) followed for asthma/ bronchitis Complicated by hx OSA, seasonal allergic rhinitis, hx fundoplication FOLLOWS FOR: review PFT with patient today; unable to breathe well. Pt states she noticed being on inhalers have made her feel better but then 2 weeks ago started getting bad again. Still grieving after OD death of daughter. Using Symbicort 160, rescue inhaler 2 or 3 times daily, Singulair. Persistent tight wheeze. Denies reflux or phlegm. Allergy Profile 08/06/2014-negative, total IgE 43 with no specific elevations. PFT 09/08/2014-mild obstructive airways disease with insignificant response to bronchodilator, mild restriction. FEV1 1.99/74%, FEV1/FVC  0.87/111%, TLC 77%, DLCO 79%.  ROS-see HPI Constitutional:   + weight loss, no-night sweats, fevers, chills, fatigue, lassitude. HEENT:   + headaches, difficulty swallowing, tooth/dental problems, sore throat,       +sneezing, no-itching, ear ache, +nasal congestion, post nasal drip,  CV:  No-   chest pain, orthopnea, PND, swelling in lower extremities, anasarca,                                  dizziness, palpitations Resp: +shortness of breath with exertion or at rest.              +productive cough,  No non-productive cough,  No- coughing up of blood.              No-   change in color of mucus.  No- wheezing.   Skin: No-   rash or lesions. GI:  No-   No-heartburn,no- indigestion, abdominal pain, nausea, vomiting,  GU: MS:  No-   joint pain or swelling.  No- decreased range of motion.  No- back pain. Neuro-     nothing unusual Psych:  No- change in mood or affect. + depression or anxiety.  No memory loss.  OBJ- Physical Exam General- Alert, Oriented, Affect-appropriate, Distress- none acute, + overweight Skin- rash-none, lesions- none, excoriation- none Lymphadenopathy- none Head- atraumatic            Eyes- Gross vision intact, PERRLA, conjunctivae and secretions clear            Ears- Hearing, canals-normal            Nose- Clear, no-Septal dev, mucus, polyps, erosion, perforation             Throat- Mallampati II , mucosa  clear , drainage- none, tonsils- atrophic Neck- flexible , trachea midline, no stridor , thyroid nl, carotid no bruit Chest - symmetrical excursion , unlabored           Heart/CV- RRR , no murmur , no gallop  , no rub, nl s1 s2                           - JVD- none , edema- none, stasis changes- none, varices- none           Lung- , wheeze+ mild, cough+ raspy , dullness-none, rub- none           Chest wall-  Abd- tender-no, distended-no, bowel sounds-present, HSM- no Br/ Gen/ Rectal- Not done, not indicated Extrem- cyanosis- none, clubbing, none, atrophy-  none, strength- nl Neuro- grossly intact to observation

## 2014-09-12 ENCOUNTER — Encounter (HOSPITAL_COMMUNITY): Payer: Self-pay | Admitting: Emergency Medicine

## 2014-09-12 ENCOUNTER — Emergency Department (HOSPITAL_COMMUNITY)
Admission: EM | Admit: 2014-09-12 | Discharge: 2014-09-12 | Disposition: A | Discharge status: Nursing Home (Includes ICF) | Payer: Medicare Other | Source: Home / Self Care | Attending: Emergency Medicine | Admitting: Emergency Medicine

## 2014-09-12 ENCOUNTER — Emergency Department (HOSPITAL_COMMUNITY)
Admission: EM | Admit: 2014-09-12 | Discharge: 2014-09-12 | Disposition: A | Discharge status: Home / Self Care | Payer: Medicare Other | Attending: Emergency Medicine | Admitting: Emergency Medicine

## 2014-09-12 ENCOUNTER — Emergency Department (HOSPITAL_COMMUNITY): Payer: Medicare Other

## 2014-09-12 DIAGNOSIS — F329 Major depressive disorder, single episode, unspecified: Secondary | ICD-10-CM | POA: Insufficient documentation

## 2014-09-12 DIAGNOSIS — Z7951 Long term (current) use of inhaled steroids: Secondary | ICD-10-CM | POA: Diagnosis not present

## 2014-09-12 DIAGNOSIS — J449 Chronic obstructive pulmonary disease, unspecified: Secondary | ICD-10-CM

## 2014-09-12 DIAGNOSIS — Z79899 Other long term (current) drug therapy: Secondary | ICD-10-CM | POA: Insufficient documentation

## 2014-09-12 DIAGNOSIS — Z880 Allergy status to penicillin: Secondary | ICD-10-CM | POA: Diagnosis not present

## 2014-09-12 DIAGNOSIS — Z8614 Personal history of Methicillin resistant Staphylococcus aureus infection: Secondary | ICD-10-CM | POA: Diagnosis not present

## 2014-09-12 DIAGNOSIS — E1165 Type 2 diabetes mellitus with hyperglycemia: Secondary | ICD-10-CM

## 2014-09-12 DIAGNOSIS — R059 Cough, unspecified: Secondary | ICD-10-CM

## 2014-09-12 DIAGNOSIS — Z794 Long term (current) use of insulin: Secondary | ICD-10-CM | POA: Diagnosis not present

## 2014-09-12 DIAGNOSIS — Z792 Long term (current) use of antibiotics: Secondary | ICD-10-CM | POA: Insufficient documentation

## 2014-09-12 DIAGNOSIS — K589 Irritable bowel syndrome without diarrhea: Secondary | ICD-10-CM | POA: Insufficient documentation

## 2014-09-12 DIAGNOSIS — R05 Cough: Secondary | ICD-10-CM | POA: Diagnosis present

## 2014-09-12 DIAGNOSIS — J441 Chronic obstructive pulmonary disease with (acute) exacerbation: Secondary | ICD-10-CM

## 2014-09-12 DIAGNOSIS — R197 Diarrhea, unspecified: Secondary | ICD-10-CM | POA: Insufficient documentation

## 2014-09-12 DIAGNOSIS — Z87891 Personal history of nicotine dependence: Secondary | ICD-10-CM | POA: Insufficient documentation

## 2014-09-12 DIAGNOSIS — E669 Obesity, unspecified: Secondary | ICD-10-CM | POA: Insufficient documentation

## 2014-09-12 DIAGNOSIS — M797 Fibromyalgia: Secondary | ICD-10-CM | POA: Insufficient documentation

## 2014-09-12 DIAGNOSIS — K529 Noninfective gastroenteritis and colitis, unspecified: Secondary | ICD-10-CM

## 2014-09-12 DIAGNOSIS — E119 Type 2 diabetes mellitus without complications: Secondary | ICD-10-CM | POA: Insufficient documentation

## 2014-09-12 DIAGNOSIS — K219 Gastro-esophageal reflux disease without esophagitis: Secondary | ICD-10-CM | POA: Insufficient documentation

## 2014-09-12 LAB — URINALYSIS, ROUTINE W REFLEX MICROSCOPIC
Glucose, UA: 100 mg/dL — AB
HGB URINE DIPSTICK: NEGATIVE
Ketones, ur: 40 mg/dL — AB
NITRITE: NEGATIVE
PROTEIN: 30 mg/dL — AB
Specific Gravity, Urine: 1.027 (ref 1.005–1.030)
UROBILINOGEN UA: 1 mg/dL (ref 0.0–1.0)
pH: 5 (ref 5.0–8.0)

## 2014-09-12 LAB — COMPREHENSIVE METABOLIC PANEL
ALT: 22 U/L (ref 0–35)
ANION GAP: 17 — AB (ref 5–15)
AST: 19 U/L (ref 0–37)
Albumin: 3.7 g/dL (ref 3.5–5.2)
Alkaline Phosphatase: 78 U/L (ref 39–117)
BUN: 14 mg/dL (ref 6–23)
CO2: 25 mEq/L (ref 19–32)
CREATININE: 0.88 mg/dL (ref 0.50–1.10)
Calcium: 9.7 mg/dL (ref 8.4–10.5)
Chloride: 95 mEq/L — ABNORMAL LOW (ref 96–112)
GFR calc Af Amer: 82 mL/min — ABNORMAL LOW (ref >90)
GFR, EST NON AFRICAN AMERICAN: 71 mL/min — AB (ref >90)
GLUCOSE: 230 mg/dL — AB (ref 70–99)
Potassium: 4.3 mEq/L (ref 3.7–5.3)
SODIUM: 137 meq/L (ref 137–147)
TOTAL PROTEIN: 7.8 g/dL (ref 6.0–8.3)
Total Bilirubin: 0.8 mg/dL (ref 0.3–1.2)

## 2014-09-12 LAB — CBC WITH DIFFERENTIAL/PLATELET
Basophils Absolute: 0.1 10*3/uL (ref 0.0–0.1)
Basophils Relative: 1 % (ref 0–1)
EOS ABS: 0.2 10*3/uL (ref 0.0–0.7)
EOS PCT: 1 % (ref 0–5)
HCT: 44.1 % (ref 36.0–46.0)
Hemoglobin: 15 g/dL (ref 12.0–15.0)
LYMPHS ABS: 2.8 10*3/uL (ref 0.7–4.0)
LYMPHS PCT: 16 % (ref 12–46)
MCH: 30.5 pg (ref 26.0–34.0)
MCHC: 34 g/dL (ref 30.0–36.0)
MCV: 89.6 fL (ref 78.0–100.0)
MONO ABS: 0.9 10*3/uL (ref 0.1–1.0)
MONOS PCT: 5 % (ref 3–12)
Neutro Abs: 13.5 10*3/uL — ABNORMAL HIGH (ref 1.7–7.7)
Neutrophils Relative %: 77 % (ref 43–77)
Platelets: 329 10*3/uL (ref 150–400)
RBC: 4.92 MIL/uL (ref 3.87–5.11)
RDW: 12.5 % (ref 11.5–15.5)
WBC: 17.5 10*3/uL — AB (ref 4.0–10.5)

## 2014-09-12 LAB — URINE MICROSCOPIC-ADD ON

## 2014-09-12 MED ORDER — MORPHINE SULFATE 4 MG/ML IJ SOLN
4.0000 mg | Freq: Once | INTRAMUSCULAR | Status: AC
Start: 2014-09-12 — End: 2014-09-12
  Administered 2014-09-12: 4 mg via INTRAMUSCULAR
  Filled 2014-09-12: qty 1

## 2014-09-12 MED ORDER — HYDROCODONE-ACETAMINOPHEN 5-325 MG PO TABS: 2.0000 | ORAL_TABLET | ORAL | Status: AC | PRN

## 2014-09-12 MED ORDER — SODIUM CHLORIDE 0.9 % IV BOLUS (SEPSIS)
1000.0000 mL | Freq: Once | INTRAVENOUS | Status: AC
  Administered 2014-09-12: 1000 mL via INTRAVENOUS

## 2014-09-12 MED ORDER — IPRATROPIUM-ALBUTEROL 0.5-2.5 (3) MG/3ML IN SOLN
3.0000 mL | Freq: Once | RESPIRATORY_TRACT | Status: AC
  Administered 2014-09-12: 3 mL via RESPIRATORY_TRACT
  Filled 2014-09-12: qty 3

## 2014-09-12 MED ORDER — METRONIDAZOLE 500 MG PO TABS: 500.0000 mg | ORAL_TABLET | Freq: Two times a day (BID) | ORAL | Status: AC

## 2014-09-12 MED ORDER — ONDANSETRON HCL 4 MG/2ML IJ SOLN
4.0000 mg | Freq: Once | INTRAMUSCULAR | Status: AC
  Administered 2014-09-12: 4 mg via INTRAMUSCULAR
  Filled 2014-09-12: qty 2

## 2014-09-12 NOTE — ED Notes (Signed)
Asked about collecting urine she stated she would drink some water and try in a little bit.

## 2014-09-12 NOTE — Discharge Instructions (Signed)
We have determined that your problem requires further evaluation in the emergency department.  We will take care of your transport there.  Once at the emergency department, you will be evaluated by a provider and they will order whatever treatment or tests they deem necessary.  We cannot guarantee that they will do any specific test or do any specific treatment.  ° °

## 2014-09-12 NOTE — ED Provider Notes (Signed)
CSN: [id number]     Arrival date & time 09/12/14  1102 History   First MD Initiated Contact with Patient 09/12/14 1113     Chief Complaint  Patient presents with  . Shortness of Breath  . Cough     (Consider location/radiation/quality/duration/timing/severity/associated sxs/prior Treatment) Patient is a 58 y.o. female presenting with diarrhea. The history is provided by the patient. No language interpreter was used.  Diarrhea Quality:  Watery Severity:  Severe Onset quality:  Sudden Number of episodes:  Multiple Duration:  1 day Timing:  Constant Progression:  Worsening Relieved by:  Nothing Worsened by:  Nothing tried Ineffective treatments:  None tried Associated symptoms: abdominal pain and myalgias    Pt is on doxycycline for copd.   Pt is concerned that she has c diff.   Pt reports multiple episodes of diarrhea.  Pt complains of shortness of breath.   Past Medical History  Diagnosis Date  . Asthma   . COPD (chronic obstructive pulmonary disease)   . Depression   . GERD (gastroesophageal reflux disease)     Dr Olevia Perches  . Gastroparesis   . Migraines   . Menopause   . IBS (irritable bowel syndrome)   . Diabetes mellitus type II   . Peripheral neuropathy   . Fibromyalgia   . Obesity   . Barrett's esophagus   . MRSA (methicillin resistant Staphylococcus aureus)     possible (by verbal report from pt)  . History of hiatal hernia   . OSA (obstructive sleep apnea)     pt denies 02/13/12, 02/20/12  . C. difficile colitis 01/2012 hosp   Past Surgical History  Procedure Laterality Date  . Abdominal hysterectomy      Complete  . Sinus surgery with instatrak      x3  . Surgery left heel spur    . Appendectomy    . Ventral hernia repair      with mesh  . Cholecystectomy    . Nissen fundoplication  8088  . Mouth surgery    . Esophagogastroduodenoscopy  02/13/2012    Procedure: ESOPHAGOGASTRODUODENOSCOPY (EGD);  Surgeon: Arta Silence, MD;  Location: [facility] ENDOSCOPY;   Service: Endoscopy;  Laterality: N/A;   Family History  Problem Relation Age of Onset  . Diabetes Mother   . Lung cancer Mother   . Cancer Mother     lung  . Esophageal cancer Father   . Heart disease Father   . Cancer Father     esophageal  . Diabetes Father   . Colon polyps Father     Family History  . Breast cancer Maternal Grandmother   . Heart disease Maternal Grandmother   . Diabetes Maternal Grandmother   . Cancer Maternal Grandmother     breast, colon  . Heart attack Brother 38  . Colon cancer Maternal Grandfather   . Colon polyps Maternal Grandfather   . Diabetes Maternal Grandfather   . Colon polyps Paternal Grandfather   . Colon cancer Paternal Grandfather   . Breast cancer Maternal Aunt   . Arthritis    . Hypertension    . Uterine cancer Other     great grandmother   History  Substance Use Topics  . Smoking status: Former Smoker -- 1.00 packs/day for 20 years    Quit date: 12/24/2009  . Smokeless tobacco: Not on file     Comment: Stopped April 2011  . Alcohol Use: No     Comment: occasional (1-2 times per month)  OB History    No data available     Review of Systems  Gastrointestinal: Positive for abdominal pain and diarrhea.  Musculoskeletal: Positive for myalgias.  All other systems reviewed and are negative.     Allergies  Droperidol; Amoxicillin-pot clavulanate; Ciprofloxacin; Hydromorphone hcl; Iohexol; Ketorolac tromethamine; Moxifloxacin; Penicillins; Pioglitazone; Pregabalin; Pristiq; Quetiapine; and Topiramate  Home Medications   Prior to Admission medications   Medication Sig Start Date End Date Taking? Authorizing Provider  albuterol (VENTOLIN HFA) 108 (90 BASE) MCG/ACT inhaler Inhale 2 puffs into the lungs every 4 (four) hours as needed for wheezing or shortness of breath. 08/24/14   Aleksei Plotnikov V, MD  BD PEN NEEDLE NANO U/F 32G X 4 MM MISC Use 2 times daily. 08/28/14   Aleksei Plotnikov V, MD  Botulinum Toxin Type A (BOTOX)  200 UNITS SOLR Every 3 months for headaches, given by Neurologist, Dr. Domingo Cocking     Historical Provider, MD  budesonide-formoterol [facility]) 160-4.5 MCG/ACT inhaler Inhale 2 puffs into the lungs 2 (two) times daily. 06/24/14   Hendricks Limes, MD  clonazePAM (KLONOPIN) 1 MG tablet 1-2 tablets daily prn 08/04/14   Aleksei Plotnikov V, MD  cyclobenzaprine (FLEXERIL) 10 MG tablet TAKE 1 TABLET AT BEDTIME FOR NIGHT TIME MUSCLE SPASMS 08/18/14   Aleksei Plotnikov V, MD  cycloSPORINE (RESTASIS) 0.05 % ophthalmic emulsion Place 1 drop into both eyes daily.      Historical Provider, MD  dicyclomine (BENTYL) 20 MG tablet TAKE 1 TABLET BY MOUTH 3 TIMES DAILY AS NEEDED 01/30/14   Cassandria Anger, MD  doxycycline (VIBRA-TABS) 100 MG tablet 2 today then one daily 09/08/14   Deneise Lever, MD  DULoxetine (CYMBALTA) 30 MG capsule Take 1 capsule (30 mg total) by mouth daily. 07/27/14   Aleksei Plotnikov V, MD  escitalopram (LEXAPRO) 20 MG tablet Take 20 mg by mouth daily. 11/14/13   Aleksei Plotnikov V, MD  estradiol (ESTRACE) 0.5 MG tablet Take 1 tablet by mouth  daily    Aleksei Plotnikov V, MD  fluconazole (DIFLUCAN) 150 MG tablet Take 1 tablet (150 mg total) by mouth daily. 09/08/14   Deneise Lever, MD  furosemide (LASIX) 20 MG tablet Take 1 tablet (20 mg total) by mouth daily as needed (swelling). As needed 11/26/12   Cassandria Anger, MD  HYDROcodone-acetaminophen (NORCO) 10-325 MG per tablet Take 1 tablet by mouth 4 (four) times daily as needed. 08/04/14   Aleksei Plotnikov V, MD  hydrOXYzine (VISTARIL) 50 MG capsule 2 TAB BY MOUTH FOR INSOMNIA QHS PRN 12/23/13   Lew Dawes V, MD  Insulin Detemir (LEVEMIR) 100 UNIT/ML Pen Use 30 units in am; 30 unit in pm 08/17/14   Aleksei Plotnikov V, MD  Insulin Pen Needle 31G X 5 MM MISC 1 pen by Does not apply route 2 (two) times daily. 09/02/14   Aleksei Plotnikov V, MD  Insulin Syringe-Needle U-100 (B-D INSULIN SYRINGE 2CC/27.5G) 27.5G X 5/8" 2 ML MISC Use  bid 05/08/14   Aleksei Plotnikov V, MD  ipratropium-albuterol (DUONEB) 0.5-2.5 (3) MG/3ML SOLN TAKE 3 MLS BY NEBULIZATION 4 (FOUR) TIMES DAILY AS NEEDED. 08/24/14   Lew Dawes V, MD  metFORMIN (GLUCOPHAGE) 500 MG tablet Take 1 tablet by mouth  twice a day with meals    Aleksei Plotnikov V, MD  mometasone-formoterol (DULERA) 200-5 MCG/ACT AERO Inhale 2 puffs into the lungs 2 (two) times daily. 09/08/14   Deneise Lever, MD  montelukast (SINGULAIR) 10 MG tablet Take 1 tablet by  mouth at  bedtime    Aleksei Plotnikov V, MD  omeprazole (PRILOSEC) 40 MG capsule Take 1 capsule (40 mg total) by mouth daily.    Lew Dawes V, MD  ONE TOUCH ULTRA TEST test strip USE AS DIRECTED 05/14/14   Cassandria Anger, MD  PATADAY 0.2 % SOLN Use everyday 11/20/11   Historical Provider, MD  promethazine (PHENERGAN) 25 MG tablet TAKE 1-2 TABLETS EVERY 6 HOURS AS NEEDED FOR NAUSEA 08/04/14   Aleksei Plotnikov V, MD  promethazine-codeine (PHENERGAN WITH CODEINE) 6.25-10 MG/5ML syrup TAKE 5 MLS BY MOUTH EVERY 4 HOURS AS NEEDED FOR COUGH 07/04/14   Aleksei Plotnikov V, MD  Tiotropium Bromide Monohydrate (SPIRIVA RESPIMAT) 2.5 MCG/ACT AERS Inhale 2 puffs into the lungs daily. 09/08/14   Deneise Lever, MD  triamcinolone (KENALOG) 0.1 % cream Apply topically 3 (three) times daily. As needed for rash 07/03/11   Aleksei Plotnikov V, MD   BP 112/66 mmHg  Pulse 128  Resp 20  SpO2 98% Physical Exam  Constitutional: She is oriented to person, place, and time. She appears well-developed and well-nourished.  HENT:  Head: Normocephalic and atraumatic.  Eyes: Conjunctivae and EOM are normal. Pupils are equal, round, and reactive to light.  Neck: Normal range of motion.  Cardiovascular: Normal rate and regular rhythm.   Pulmonary/Chest: Effort normal. She has wheezes.  Rhonchi,  Faint wheeze bases  Abdominal: Soft. She exhibits no distension.  Musculoskeletal: Normal range of motion.  Neurological: She is alert and  oriented to person, place, and time.  Skin: Skin is warm.  Psychiatric: She has a normal mood and affect.  Nursing note and vitals reviewed.   ED Course  Procedures (including critical care time) Labs Review Labs Reviewed  CBC WITH DIFFERENTIAL - Abnormal; Notable for the following:    WBC 17.5 (*)    Neutro Abs 13.5 (*)    All other components within normal limits  COMPREHENSIVE METABOLIC PANEL - Abnormal; Notable for the following:    Chloride 95 (*)    Glucose, Bld 230 (*)    GFR calc non Af Amer 71 (*)    GFR calc Af Amer 82 (*)    Anion gap 17 (*)    All other components within normal limits  GI PATHOGEN PANEL BY PCR, STOOL  URINALYSIS, ROUTINE W REFLEX MICROSCOPIC    Imaging Review No results found.   EKG Interpretation   Date/Time:  Saturday September 12 2014 11:09:56 EST Ventricular Rate:  128 PR Interval:  152 QRS Duration: 66 QT Interval:  302 QTC Calculation: 440 R Axis:   94 Text Interpretation:  Sinus tachycardia No Ectopy No injury      Confirmed  by Jeneen Rinks  MD, Woden ([id number]) on 09/12/2014 12:15:58 PM      MDM   Final diagnoses:  Cough  Diarrhea  Chronic obstructive pulmonary disease, unspecified COPD, unspecified chronic bronchitis type    Pt given IV fluids,  Breathing easier after neb treatment.  Pt unable to obtain stool specimin for 4.5 hours.  Small amount of stool obtained.   I will start flagyl.      Hollace Kinnier Easley, PA-C 09/12/14 1709  Tanna Furry, MD 2014/10/12 2207

## 2014-09-12 NOTE — Discharge Instructions (Signed)
Chronic Obstructive Pulmonary Disease  Chronic obstructive pulmonary disease (COPD) is a common lung condition in which airflow from the lungs is limited. COPD is a general term that can be used to describe many different lung problems that limit airflow, including both chronic bronchitis and emphysema. If you have COPD, your lung function will probably never return to normal, but there are measures you can take to improve lung function and make yourself feel better.   CAUSES    Smoking (common).    Exposure to secondhand smoke.    Genetic problems.   Chronic inflammatory lung diseases or recurrent infections.  SYMPTOMS    Shortness of breath, especially with physical activity.    Deep, persistent (chronic) cough with a large amount of thick mucus.    Wheezing.    Rapid breaths (tachypnea).    Gray or bluish discoloration (cyanosis) of the skin, especially in fingers, toes, or lips.    Fatigue.    Weight loss.    Frequent infections or episodes when breathing symptoms become much worse (exacerbations).    Chest tightness.  DIAGNOSIS   Your health care provider will take a medical history and perform a physical examination to make the initial diagnosis. Additional tests for COPD may include:    Lung (pulmonary) function tests.   Chest X-ray.   CT scan.   Blood tests.  TREATMENT   Treatment available to help you feel better when you have COPD includes:    Inhaler and nebulizer medicines. These help manage the symptoms of COPD and make your breathing more comfortable.   Supplemental oxygen. Supplemental oxygen is only helpful if you have a low oxygen level in your blood.    Exercise and physical activity. These are beneficial for nearly all people with COPD. Some people may also benefit from a pulmonary rehabilitation program.  HOME CARE INSTRUCTIONS    Take all medicines (inhaled or pills) as directed by your health care provider.   Avoid over-the-counter medicines or cough syrups  that dry up your airway (such as antihistamines) and slow down the elimination of secretions unless instructed otherwise by your health care provider.    If you are a smoker, the most important thing that you can do is stop smoking. Continuing to smoke will cause further lung damage and breathing trouble. Ask your health care provider for help with quitting smoking. He or she can direct you to community resources or hospitals that provide support.   Avoid exposure to irritants such as smoke, chemicals, and fumes that aggravate your breathing.   Use oxygen therapy and pulmonary rehabilitation if directed by your health care provider. If you require home oxygen therapy, ask your health care provider whether you should purchase a pulse oximeter to measure your oxygen level at home.    Avoid contact with individuals who have a contagious illness.   Avoid extreme temperature and humidity changes.   Eat healthy foods. Eating smaller, more frequent meals and resting before meals may help you maintain your strength.   Stay active, but balance activity with periods of rest. Exercise and physical activity will help you maintain your ability to do things you want to do.   Preventing infection and hospitalization is very important when you have COPD. Make sure to receive all the vaccines your health care provider recommends, especially the pneumococcal and influenza vaccines. Ask your health care provider whether you need a pneumonia vaccine.   Learn and use relaxation techniques to manage stress.   Learn   going to whistle and breathe out (exhale) through the pursed lips for 2 seconds.   Diaphragmatic breathing. Start by putting one hand on your abdomen just above  your waist. Inhale slowly through your nose. The hand on your abdomen should move out. Then purse your lips and exhale slowly. You should be able to feel the hand on your abdomen moving in as you exhale.   Learn and use controlled coughing to clear mucus from your lungs. Controlled coughing is a series of short, progressive coughs. The steps of controlled coughing are:  1. Lean your head slightly forward.  2. Breathe in deeply using diaphragmatic breathing.  3. Try to hold your breath for 3 seconds.  4. Keep your mouth slightly open while coughing twice.  5. Spit any mucus out into a tissue.  6. Rest and repeat the steps once or twice as needed. SEEK MEDICAL CARE IF:   You are coughing up more mucus than usual.   There is a change in the color or thickness of your mucus.   Your breathing is more labored than usual.   Your breathing is faster than usual.  SEEK IMMEDIATE MEDICAL CARE IF:   You have shortness of breath while you are resting.   You have shortness of breath that prevents you from:  Being able to talk.   Performing your usual physical activities.   You have chest pain lasting longer than 5 minutes.   Your skin color is more cyanotic than usual.  You measure low oxygen saturations for longer than 5 minutes with a pulse oximeter. MAKE SURE YOU:   Understand these instructions.  Will watch your condition.  Will get help right away if you are not doing well or get worse. Document Released: 06/21/2005 Document Revised: 01/26/2014 Document Reviewed: 05/08/2013 [facility] Patient Information 2015 [place], [place]. This information is not intended to replace advice given to you by your health care provider. Make sure you discuss any questions you have with your health care provider. Diarrhea Diarrhea is frequent loose and watery bowel movements. It can cause you to feel weak and dehydrated. Dehydration can cause you to become tired and thirsty, have a dry  mouth, and have decreased urination that often is dark yellow. Diarrhea is a sign of another problem, most often an infection that will not last long. In most cases, diarrhea typically lasts 2-3 days. However, it can last longer if it is a sign of something more serious. It is important to treat your diarrhea as directed by your caregiver to lessen or prevent future episodes of diarrhea. CAUSES  Some common causes include:  Gastrointestinal infections caused by viruses, bacteria, or parasites.  Food poisoning or food allergies.  Certain medicines, such as antibiotics, chemotherapy, and laxatives.  Artificial sweeteners and fructose.  Digestive disorders. HOME CARE INSTRUCTIONS  Ensure adequate fluid intake (hydration): Have 1 cup (8 oz) of fluid for each diarrhea episode. Avoid fluids that contain simple sugars or sports drinks, fruit juices, whole milk products, and sodas. Your urine should be clear or pale yellow if you are drinking enough fluids. Hydrate with an oral rehydration solution that you can purchase at pharmacies, retail stores, and online. You can prepare an oral rehydration solution at home by mixing the following ingredients together:   - tsp table salt.   tsp baking soda.   tsp salt substitute containing potassium chloride.  1  tablespoons sugar.  1 L (34 oz) of water.  Certain foods and beverages may increase  the speed at which food moves through the gastrointestinal (GI) tract. These foods and beverages should be avoided and include:  Caffeinated and alcoholic beverages.  High-fiber foods, such as raw fruits and vegetables, nuts, seeds, and whole grain breads and cereals.  Foods and beverages sweetened with sugar alcohols, such as xylitol, sorbitol, and mannitol.  Some foods may be well tolerated and may help thicken stool including:  Starchy foods, such as rice, toast, pasta, low-sugar cereal, oatmeal, grits, baked potatoes, crackers, and  bagels.  Bananas.  Applesauce.  Add probiotic-rich foods to help increase healthy bacteria in the GI tract, such as yogurt and fermented milk products.  Wash your hands well after each diarrhea episode.  Only take over-the-counter or prescription medicines as directed by your caregiver.  Take a warm bath to relieve any burning or pain from frequent diarrhea episodes. SEEK IMMEDIATE MEDICAL CARE IF:   You are unable to keep fluids down.  You have persistent vomiting.  You have blood in your stool, or your stools are black and tarry.  You do not urinate in 6-8 hours, or there is only a small amount of very dark urine.  You have abdominal pain that increases or localizes.  You have weakness, dizziness, confusion, or light-headedness.  You have a severe headache.  Your diarrhea gets worse or does not get better.  You have a fever or persistent symptoms for more than 2-3 days.  You have a fever and your symptoms suddenly get worse. MAKE SURE YOU:   Understand these instructions.  Will watch your condition.  Will get help right away if you are not doing well or get worse. Document Released: 09/01/2002 Document Revised: 01/26/2014 Document Reviewed: 05/19/2012 [facility] Patient Information 2015 [place], [place]. This information is not intended to replace advice given to you by your health care provider. Make sure you discuss any questions you have with your health care provider.

## 2014-09-12 NOTE — ED Notes (Signed)
Patient transported to X-ray 

## 2014-09-12 NOTE — ED Provider Notes (Signed)
Chief Complaint   URI   History of Present Illness   Mitsuye C Behrendt is a 58 year old female with multiple medical comorbidities including COPD, asthma, diabetes, fibromyalgia, migraine headaches, and neuropathy. She was in her usual state of health up until 4 days ago when she developed increasing shortness of breath, chest tightness, wheezing, and cough productive yellow sputum. She denies any chest pain. She also has had diarrhea with 3-4 loose stools per day with occasional blood. She has a history of C. difficile colitis. She's felt nauseated but has not vomited. She has severe abdominal pain localized to both flank areas. Her blood sugars have been poorly controlled, ranging in the 300s. She is on Levemir once a day. She's also had chills, nasal congestion with clear drainage, and sore throat. She saw Dr. Keturah Barre when this all began, 4 days ago. He changed her inhalers to [facility] and albuterol. He prescribed doxycycline, but she's not been in to take this because of her GI symptoms. She saw her GI doctor 3 days ago and he ordered stool specimens for C. difficile, but she's not able to get these yet. She denies any fever or chills.  Review of Systems   Other than as noted above, the patient denies any of the following symptoms: Systemic:  No fevers, chills, sweats, or myalgias. Eye:  No redness or discharge. ENT:  No ear pain, headache, nasal congestion, drainage, sinus pressure, or sore throat. Neck:  No neck pain, stiffness, or swollen glands. Lungs:  No cough, sputum production, hemoptysis, wheezing, chest tightness, shortness of breath or chest pain. GI:  No abdominal pain, nausea, vomiting or diarrhea.  [place]   Past medical history, family history, social history, meds, and allergies were reviewed. She has multiple medical allergies including droperidol, Augmentin, ciprofloxacin, Dilaudid, IV contrast dye, Toradol, penicillins,. Actos, Lyrica, Pristiq, Seroquel, and Topamax. She  has numerous medical problems including asthma, COPD, depression, GERD, gastroparesis, migraines, IBS, type 2 diabetes, peripheral neuropathy, fibromyalgia, obesity, Barrett's esophagus, and sleep apnea. Current meds include albuterol, Dulera, Flexeril, Restasis, Bentyl, Cymbalta, Lexapro, estradiol, furosemide, hydroxyzine, Levemir insulin, metformin, Singulair, Prilosec, and Phenergan.  Physical exam   Vital signs:  BP 120/78 mmHg  Pulse 120  Temp(Src) 97.4 F (36.3 C) (Oral)  Resp 20  SpO2 93% General:  Alert and oriented.  She appears very uncomfortable. She is moaning in pain, tachypnea, with mildly labored respirations, no retractions or use of accessory muscles.  Skin warm and dry. Eye:  No conjunctival injection or drainage. Lids were normal. ENT:  TMs and canals were normal, without erythema or inflammation.  Nasal mucosa was clear and uncongested, without drainage.  Mucous membranes were dry.  Pharynx was clear with no exudate or drainage.  There were no oral ulcerations or lesions. Neck:  Supple, no adenopathy, tenderness or mass. Lungs:  No respiratory distress.  She has diffuse expiratory wheezes in both lungs, and all lung fields, anteriorly and posteriorly, no rales or rhonchi, good air movement.  Heart:  Regular rhythm, without gallops, murmers or rubs. Skin:  Clear, warm, and dry, without rash or lesions.   Assessment     The primary encounter diagnosis was COPD exacerbation. Diagnoses of Gastroenteritis and Poorly controlled type 2 diabetes mellitus were also pertinent to this visit.  I think she has an exacerbation of her COPD and also gastroenteritis with mild dehydration. Her diabetes is also poorly controlled. I think she'll need treatment for her COPD and IV fluids.  Plan  The patient was transferred to the ED via shuttle in stable condition.  Medical Decision Making:  58 year old female with COPD and DM has a 4 day history of productive cough, wheezing, shortness  of breath, abdominal pain, diarrhea, and nausea.  On exam she is significantly short of breath with wheezes in all lung fields.  Her mucous membranes are dry, and her abdomen is non tender.  I feel she has a COPD exacerbation and is dehydrated.  She will need IV fluids and treatment for her COPD.  Will send by shuttle.       Harden Mo, MD 09/12/14 [phone]

## 2014-09-12 NOTE — ED Notes (Signed)
C/o sob,  Started since this weekend.  Cough productive with yellow sputum.  Diarrhea.  And nausea.   Symptoms present since Wednesday. .  No relief with otc meds.

## 2014-09-12 NOTE — ED Notes (Signed)
Pt sent from [facility] for further eval of increased SOB and cough with URI sx x 1 week; pt with hx of COPD

## 2014-09-12 NOTE — ED Provider Notes (Signed)
Care assumed from [facility], PA-C  [place] is a 58 y.o. female presents with several days of diarrhea and concern for possible C. Diff.  She is taking doxycycline for a recent COPD exacerbation.  Pt also c/o SOB.    Pt given albuterol here with improvement in lung sounds, CXR without evidence of PNA.  UA with keytones and pt is currently getting fluid bolus.    Physical Exam  BP 140/89 mmHg  Pulse 113  Resp 22  SpO2 96%  Physical Exam   Face to face Exam:   General: Awake  HEENT: Atraumatic  Cardiac: Tachycardia Resp: Normal effort, clear and equal breath sounds Abd: Nondistended, nontender, no rebound or peritoneal signs Neuro:No focal weakness    ED Course  Procedures  MDM  Plan:  Pt is receiving fluid bolus.  C. Diff cultures have been sent and plan is to d/c pt home with flagyl to treat.  Pt is to d/c her doxycycline.     6:05 PM Patient reports she is feeling better. Her fluids have finished. Repeat heart rate is 104.  Patient is stable for discharge home.  Plan discussed with the patient and she is agreeable.     Abigail Butts, PA-C 09/12/14 1805  Charlesetta Shanks, MD 09/12/14 [id number]

## 2014-09-14 ENCOUNTER — Telehealth: Payer: Self-pay | Admitting: Critical Care Medicine

## 2014-09-15 ENCOUNTER — Ambulatory Visit: Payer: Self-pay | Admitting: Family

## 2014-09-15 LAB — GI PATHOGEN PANEL BY PCR, STOOL
C DIFFICILE TOXIN A/B: POSITIVE
CRYPTOSPORIDIUM BY PCR: NEGATIVE
Campylobacter by PCR: NEGATIVE
E coli (ETEC) LT/ST: NEGATIVE
E coli (STEC): NEGATIVE
E coli 0157 by PCR: NEGATIVE
G lamblia by PCR: NEGATIVE
Norovirus GI/GII: NEGATIVE
ROTAVIRUS A BY PCR: NEGATIVE
SALMONELLA BY PCR: NEGATIVE
Shigella by PCR: NEGATIVE

## 2014-09-16 ENCOUNTER — Telehealth: Payer: Self-pay

## 2014-09-16 NOTE — Telephone Encounter (Signed)
Received death certificate for Dr. Annamaria Boots to sign.  Called [facility] to pick up 09/16/2014

## 2014-09-16 NOTE — Telephone Encounter (Signed)
Im sorry for family' loss.  Certificate completed

## 2014-09-25 NOTE — Telephone Encounter (Signed)
EMS called. Pt was found dead at home.  Note 09/24/2014 OV Suspect respiratory death.  Medical Examiner declined this case.  I told EMS we would sign death certificate clint.

## 2014-09-25 DEATH — deceased

## 2014-10-09 ENCOUNTER — Telehealth: Payer: Self-pay | Admitting: Internal Medicine

## 2014-10-09 NOTE — Telephone Encounter (Signed)
Mr. Driskill just wanted to touch base and advise about [name]'s passing. I spoke to him and advised that we do have everything set as deceased, but I'd let you know.

## 2014-10-09 NOTE — Telephone Encounter (Signed)
I spoke to Mr. Smithers. He just wanted Dr. Alain Marion aware.

## 2014-12-14 ENCOUNTER — Ambulatory Visit: Payer: Self-pay | Admitting: Internal Medicine
